# Patient Record
Sex: Female | Born: 1990
Health system: Southern US, Community
[De-identification: ages and names within clinical notes are randomized; demographics above are authoritative.]

## PROBLEM LIST (undated history)

## (undated) DIAGNOSIS — R51 Headache: Secondary | ICD-10-CM

## (undated) DIAGNOSIS — J039 Acute tonsillitis, unspecified: Secondary | ICD-10-CM

## (undated) DIAGNOSIS — T783XXA Angioneurotic edema, initial encounter: Secondary | ICD-10-CM

## (undated) DIAGNOSIS — G8929 Other chronic pain: Secondary | ICD-10-CM

## (undated) DIAGNOSIS — L509 Urticaria, unspecified: Secondary | ICD-10-CM

## (undated) DIAGNOSIS — R519 Headache, unspecified: Secondary | ICD-10-CM

## (undated) DIAGNOSIS — K589 Irritable bowel syndrome without diarrhea: Secondary | ICD-10-CM

## (undated) DIAGNOSIS — J45909 Unspecified asthma, uncomplicated: Secondary | ICD-10-CM

## (undated) DIAGNOSIS — L309 Dermatitis, unspecified: Secondary | ICD-10-CM

## (undated) HISTORY — DX: Angioneurotic edema, initial encounter: T78.3XXA

## (undated) HISTORY — DX: Urticaria, unspecified: L50.9

## (undated) HISTORY — DX: Unspecified asthma, uncomplicated: J45.909

## (undated) HISTORY — DX: Other chronic pain: G89.29

## (undated) HISTORY — DX: Irritable bowel syndrome, unspecified: K58.9

## (undated) HISTORY — DX: Headache: R51

## (undated) HISTORY — PX: WISDOM TOOTH EXTRACTION: SHX21

## (undated) HISTORY — DX: Headache, unspecified: R51.9

## (undated) HISTORY — DX: Dermatitis, unspecified: L30.9

---
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
## 2010-04-14 ENCOUNTER — Ambulatory Visit: Payer: Self-pay | Admitting: Licensed Clinical Social Worker

## 2010-04-15 ENCOUNTER — Ambulatory Visit: Payer: Self-pay | Admitting: Pulmonary Disease

## 2010-04-15 DIAGNOSIS — Z87891 Personal history of nicotine dependence: Secondary | ICD-10-CM | POA: Insufficient documentation

## 2010-04-15 DIAGNOSIS — F341 Dysthymic disorder: Secondary | ICD-10-CM | POA: Insufficient documentation

## 2010-04-16 ENCOUNTER — Ambulatory Visit: Payer: Self-pay | Admitting: Pulmonary Disease

## 2010-04-18 LAB — CONVERTED CEMR LAB: Streptococcus, Group A Screen (Direct): NEGATIVE

## 2010-06-09 ENCOUNTER — Emergency Department (HOSPITAL_COMMUNITY)
Admission: EM | Admit: 2010-06-09 | Discharge: 2010-06-09 | Payer: Self-pay | Source: Home / Self Care | Admitting: Family Medicine

## 2010-08-19 ENCOUNTER — Emergency Department (HOSPITAL_COMMUNITY): Admission: EM | Admit: 2010-08-19 | Discharge: 2010-08-19 | Payer: Self-pay | Admitting: Internal Medicine

## 2010-08-26 ENCOUNTER — Telehealth: Payer: Self-pay | Admitting: Adult Health

## 2010-08-27 ENCOUNTER — Telehealth (INDEPENDENT_AMBULATORY_CARE_PROVIDER_SITE_OTHER): Payer: Self-pay | Admitting: *Deleted
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
## 2010-12-14 NOTE — Assessment & Plan Note (Signed)
Summary: discuss meds/la   CC:  under lots of stress and having alot of anxiety and dry heaves and unable to eat.  History of Present Illness: 20 y/o WF, Labine Arelia Sneddon) daughter of Baird Lyons, here for a follow up visit...   ~  April 15, 2010:  she has been under alot of stress> married last yr, husb in [employer] (prev [place], now Pensions consultant), hx anxiety/ depression/ ?ADD/ ?bipolar & prev seen by Psychiatry DrSmith on Risperdal in the past... she weaned off this on her own & was doing OK til husb deployed & she has moved back to [employer]... saw Judithe Modest in our counselling center> here now for med Rx to help w/ anxiety... notes some palpit & SOB- ?panic; +smoker, some cough... no CP, sputum, hemoptysis, f/c/s, etc... not employed, going to start [employer] soon...   Current Problems:   ALLERGIC RHINITIS (ICD-477.9) - uses OTC antihist Prn...  CIGARETTE SMOKER (ICD-305.1) - not interested in smoking cessation help at the present time...  ANXIETY DEPRESSION (ICD-300.4) - as above> we discussed trial of Prn ALPRAZOLAM 0.5mg - 1/2 to 1 tab  Tid as needed...   Allergies: 1)  ! Penicillin  Comments:  Nurse/Medical Assistant: The patient's medications and allergies were reviewed with the patient and were updated in the Medication and Allergy Lists.  Past History:  Past Medical History:  ALLERGIC RHINITIS (ICD-477.9) CIGARETTE SMOKER (ICD-305.1) ANXIETY DEPRESSION (ICD-300.4)  Family History: Reviewed history from 07/02/2009 and no changes required. Mother-allergies hyperlipidemia - MGM, MGF cancer - MGGM (blood) DM - PGF, controlled with diet  Social History: Reviewed history from 07/02/2009 and no changes required. 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  The patient denies anorexia,  fever, weight loss, weight gain, vision loss, decreased hearing, hoarseness, chest pain, syncope, peripheral edema, prolonged cough, headaches, hemoptysis, abdominal pain, melena, hematochezia, severe indigestion/heartburn, hematuria, incontinence, muscle weakness, suspicious skin lesions, transient blindness, difficulty walking, depression, unusual weight change, abnormal bleeding, enlarged lymph nodes, and angioedema.    Vital Signs:  Patient profile:   20 year old female Height:      65 inches Weight:      113.31 pounds BMI:     18.92 O2 Sat:      98 % on Room air Temp:     97.9 degrees F oral Pulse rate:   108 / minute BP sitting:   110 / 74  (right arm) Cuff size:   regular  Vitals Entered By: Randell Loop CMA (April 15, 2010 11:46 AM)  O2 Sat at Rest %:  98 O2 Flow:  Room air CC: under lots of stress and having alot of anxiety and dry heaves and unable to eat Is Patient Diabetic? No Pain Assessment Patient in pain? no      Comments meds updated today   Physical Exam  Additional Exam:  WD, Thin, 20 y/o WF in NAD... she is 63" tall & weighs 114# for a BMI= 19 GENERAL:  Alert & oriented; pleasant & cooperative... HEENT:  [place]/AT, EOM-wnl, PERRLA, [place]-benign, EACs-clear, TMs-wnl, NOSE-clear, THROAT-clear & wnl. NECK:  Supple w/ full ROM; no JVD; normal carotid impulses w/o bruits; no thyromegaly or nodules palpated; no lymphadenopathy. CHEST:  Clear to P & A; without wheezes/ rales/ or rhonchi. HEART:  Regular Rhythm; without murmurs/ rubs/  or gallops. ABDOMEN:  Soft & nontender; normal bowel sounds; no organomegaly or masses detected. EXT: without deformities or arthritic changes; no varicose veins/ venous insuffic/ or edema. NEURO:  CN's intact; motor testing normal; sensory testing normal; gait normal & balance OK. DERM:  No lesions noted; no rash etc...    Impression & Recommendations:  Problem # 1:  ANXIETY (ICD-300.00) We discussed small supply of Alprazolam 0.5mg   to try for the anxiety... instructed to use it sparingly, as needed... she will f/u w/ Judithe Modest and use her techniques to handle the stress better etc... Her updated medication list for this problem includes:    Alprazolam 0.5 Mg Tabs (Alprazolam) .Marland Kitchen... Take 1/2 to 1 tab by mouth up to 3 times daily as needed for anxiety...  Problem # 2:  CIGARETTE SMOKER (ICD-305.1) She is not interested in smoking cessation help at this time...  Complete Medication List: 1)  Yaz 3-0.02 Mg Tabs (Drospirenone-ethinyl estradiol) .... Take 1 tablet by mouth once a day 2)  Ibuprofen 200 Mg Tabs (Ibuprofen) .... As needed for pain 3)  Alprazolam 0.5 Mg Tabs (Alprazolam) .... Take 1/2 to 1 tab by mouth up to 3 times daily as needed for anxiety...  Patient Instructions: 1)  Today we wrote a new perscription for ALPRAZOLAM to try 1/2 to 1 tab up to 3 times daily as needed for anxiety.Marland KitchenMarland Kitchen  2)  Use it sparingly for the worst of the stress... 3)  Continue the counselling w/ Judithe Modest & her team... 4)  Let us know how it worked for you & if you need a refill later on... Prescriptions: ALPRAZOLAM 0.5 MG TABS (ALPRAZOLAM) take 1/2 to 1 tab by mouth up to 3 times daily as needed for anxiety...  #50 x 0   Entered and Authorized by:   Michele Mcalpine MD   Signed by:   Michele Mcalpine MD on 04/15/2010   Method used:   Print then Give to Patient   RxID:   [id number]

## 2010-12-14 NOTE — Assessment & Plan Note (Signed)
Summary: Acute NP office visit - ? strep   CC:  sore throat, body aches, and fever up to 101.4 onset yesterday.  History of Present Illness: 20 year old female pt of Dr. Kriste Basque with history of depression.   July 02, 2009--Presents for work in visit. Pt recently married , husband is in [employer], now in [place]. She needs papaerwork, physical form filled out./ Planning on leaving when paperwork complete to join him. In good health. Graduated from high school, plans on going to college. Denies chest pain, dyspnea, orthopnea, hemoptysis, fever, n/v/d, edema, headache, rashes. Seen previously by psych during stressful time in high school, was felt to have possible depression ?bipolar disorder. She took risperdal for short time. Stopped on own. Denies depression symptoms, highs/low, suicidal ideations. Says she was stressed in school, social life. That is improved now, very happy. Mom is with her today and says she has been doing very well. They are planning on her seeing pschy in future -they do not agree with diagnosis. There is no hx of bipolar in family. Pt and mother denie any known hx of manic episodes in past, mainly on moody and low episodes-"teenage moments" per mom.    ~  April 15, 2010:  she has been under alot of stress> married last yr, husb in [employer] (prev [place], now Pensions consultant), hx anxiety/ depression/ ?ADD/ ?bipolar & prev seen by Psychiatry DrSmith on Risperdal in the past... she weaned off this on her own & was doing OK til husb deployed & she has moved back to [employer]... saw Judithe Modest in our counselling center> here now for med Rx to help w/ anxiety... notes some palpit & SOB- ?panic; +smoker, some cough... no CP, sputum, hemoptysis, f/c/s, etc... not employed, going to start [employer] school soon...  April 16, 2010--Presents for an acute office visit. Complains of sore throat, body aches, fever up to 101.4 onset yesterday. Painful to swallow. concerned she has strep. Feels bad w/ body aches ,  chills and malaise. Throat is very sore. red and white patches on tonsils.  Denies chest pain, dyspnea, orthopnea, hemoptysis, fever, n/v/d, edema, headache.      Preventive Screening-Counseling & Management  Alcohol-Tobacco     Smoking Status: current  Medications Prior to Update: 1)  Yaz 3-0.02 Mg Tabs (Drospirenone-Ethinyl Estradiol) .... Take 1 Tablet By Mouth Once A Day 2)  Ibuprofen 200 Mg Tabs (Ibuprofen) .... As Needed For Pain 3)  Alprazolam 0.5 Mg Tabs (Alprazolam) .... Take 1/2 To 1 Tab By Mouth Up To 3 Times Daily As Needed For Anxiety...  Current Medications (verified): 1)  Yaz 3-0.02 Mg Tabs (Drospirenone-Ethinyl Estradiol) .... Take 1 Tablet By Mouth Once A Day 2)  Ibuprofen 200 Mg Tabs (Ibuprofen) .... As Needed For Pain 3)  Alprazolam 0.5 Mg Tabs (Alprazolam) .... Take 1/2 To 1 Tab By Mouth Up To 3 Times Daily As Needed For Anxiety...  Allergies (verified): 1)  ! 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  CIGARETTE SMOKER (ICD-305.1) - not interested in smoking cessation help at the present time...  ANXIETY DEPRESSION (ICD-300.4) -  on  Prn ALPRAZOLAM 0.5mg - 1/2 to 1 tab  Tid as needed...    Vital Signs:  Patient profile:   20 year old female Height:      65 inches Weight:      113.31 pounds BMI:     18.92 O2 Sat:      98 % on Room air Temp:     98.5 degrees F oral Pulse rate:   102 / minute BP sitting:   108 / 70  (right arm) Cuff size:   regular  Vitals Entered By: Boone Master CNA/MA  (April 16, 2010 2:55 PM)  O2 Flow:  Room air CC: sore throat, body aches, fever up to 101.4 onset yesterday Is Patient Diabetic? No Comments Medications reviewed with patient Daytime contact number verified with patient. Boone Master CNA/MA  April 16, 2010 2:55 PM    Physical Exam  Additional Exam:  GEN: A/Ox3; pleasant , NAD HEENT:  [place]/AT, ,post phaynx red, tonsillar edema 1+  NECK:  Supple w/ fair ROM; no JVD; normal carotid impulses w/o bruits; no thyromegaly or nodules palpated; ant. cervical andenopathy neg nuchal rigidity.  RESP  Clear to P & A; w/o, wheezes/ rales/ or rhonchi. CARD:  RRR, no m/r/g   GI:   Soft & nt; nml bowel sounds; no organomegaly or masses detected. Musco: Warm bil,  no calf tenderness edema, clubbing, pulses intact  Skin; clear no rash.  Neuro: intact no focal deficits noted.      Impression & Recommendations:  Problem # 1:  PHARYNGITIS, ACUTE (ICD-462)  neg strep . suspect tonsillitis REC:  Zpack take as directed.  salt water gargles as needed  alternate advil and tylenol as needed  increase fluids.  Please contact office for sooner follow up if symptoms do not improve or worsen   Her updated medication list for this problem includes:    Ibuprofen 200 Mg Tabs (Ibuprofen) .Marland Kitchen... As needed for pain    Zithromax Z-pak 250 Mg Tabs (Azithromycin) .Marland Kitchen... Take as directed.  Orders: Est. Patient Level III ([address])  Medications Added to Medication List This Visit: 1)  Zithromax Z-pak 250 Mg Tabs (Azithromycin) .... Take as directed.  Complete Medication List: 1)  Yaz 3-0.02 Mg Tabs (Drospirenone-ethinyl estradiol) .... Take 1 tablet by mouth once a day 2)  Ibuprofen 200 Mg Tabs (Ibuprofen) .... As needed for pain 3)  Alprazolam 0.5 Mg Tabs (Alprazolam) .... Take 1/2 to 1 tab by mouth up to 3 times daily as needed for anxiety.Marland KitchenMarland Kitchen 4)  Zithromax Z-pak 250 Mg Tabs (Azithromycin) .... Take as directed.  Patient Instructions: 1)  Zpack take as directed.    2)  salt water gargles as needed  3)  alternate advil and tylenol as needed  4)  increase fluids.  5)  Please contact office for sooner follow up if symptoms do not improve or worsen  Prescriptions: ZITHROMAX Z-PAK 250 MG TABS (AZITHROMYCIN) take as directed.  #1 x 0   Entered and Authorized by:   Rubye Oaks NP   Signed by:   Tamela Elsayed NP on 04/16/2010   Method used:   [facility]        [facility]  [place] [address] [facility]       [address]       [place], [place]  [address]       Ph: [phone] or [phone]       Fax: [phone]   RxID:   [id number]

## 2011-02-10 ENCOUNTER — Inpatient Hospital Stay (INDEPENDENT_AMBULATORY_CARE_PROVIDER_SITE_OTHER)
Admission: RE | Admit: 2011-02-10 | Discharge: 2011-02-10 | Disposition: A | Discharge status: Home / Self Care | Source: Ambulatory Visit | Attending: Family Medicine | Admitting: Family Medicine

## 2011-02-10 DIAGNOSIS — J309 Allergic rhinitis, unspecified: Secondary | ICD-10-CM

## 2011-04-01 NOTE — Assessment & Plan Note (Signed)
[facility]                               PULMONARY OFFICE NOTE   Suzanne Jarvis, Suzanne Jarvis                     MRN:          [id number]  DATE:09/21/2006                            DOB:          February 01, 1991    This is a 20 year old white female patient of Dr. Kriste Basque, who presents with a  three-day history of suprapubic pressure, dysuria, and urgency.  The patient  has used some over-the-counter Azo without much relief.  The patient does  complain that she has some intermittent discharge.  However, this is  somewhat chronic in nature.  The patient denies any back pain, nausea,  vomiting, fever, abdominal pain.  The patient's periods are irregular.  She  is being seen by Dr. Senaida Ores in gynecology and has had a recent workup  and negative pregnancy test three weeks ago.  The patient reports her last  urinary tract infection was greater than three years ago.  The patient is  currently sexually active.  The patient denies any rash and reports she does  use condoms, as well as birth control.  The patient also complains that she  has had some nasal congestion, left-ear pressure and some mild headaches.   PAST MEDICAL HISTORY:  Reviewed.   CURRENT MEDICATIONS:  Reviewed.   PHYSICAL EXAMINATION:  GENERAL:  The patient is a very pleasant, well-  developed, well-nourished female, in no acute distress.  VITAL SIGNS:  She is afebrile with stable vital signs.  HEENT:  Nasal mucosa is somewhat pale.  Nontender sinuses to percussion.  TMs are normal.  NECK:  The neck is supple, without adenopathy.  Posterior pharynx is clear.  LUNGS:  Lung sounds are clear.  CARDIAC:  Regular rate.  ABDOMEN:  Soft.  EXTREMITIES:  Warm without any edema.   DATA:  Urinalysis reveals small blood, small leukocytes, 20-30 WBC, 3-5 RBC  and 2+ bacteria.   IMPRESSION AND PLAN:  1. Acute cystitis.  The patient is to begin Cipro 250 mg b.i.d. times 7      days.  I advised on urinary  hygiene measures.  The patient is advised      on safe sexual practices and abstinence.  The patient has recently had      extensive workup, being checked for pregnancy and STDs.  However, I      explained to the patient and her mother than she could still have been      exposed from previous times to now and, if symptoms do not totally      resolve, will need a followup with her gynecologist.  I also have      recommended pregnancy test today; however, the patient refused.  Mother      does not want to do that.  I have suggested she get one over-the-      counter and check.  2. Rhinitis.  The patient is to use saline nasal spray and Claritin over-      the-counter, if needed,      along with Mucinex DM twice daily times one week.  The patient  is to      return if no improvement in symptoms.      Rubye Oaks, NP  Electronically Signed      Lonzo Cloud. Kriste Basque, MD  Electronically Signed   TP/MedQ  DD: 09/22/2006  DT: 09/22/2006  Job #: [id number]

## 2011-04-01 NOTE — Assessment & Plan Note (Signed)
[facility]                               PULMONARY OFFICE NOTE   CHANAH, TIDMORE                     MRN:          [id number]  DATE:09/04/2006                            DOB:          12-29-1990    HISTORY:  This patient is a 20 year old white female, new patient to the  practice.  The patient is the daughter of our front Scientist, product/process development, Johny Drilling.  Patient is a Licensed conveyancer at [employer].  She was previously a  patient of Dr. Fabian Sharp, and is changing over to establish care under Dr.  Alroy Dust.  She presents today for an acute office visit.  Complains of a  3-day history of productive cough with clear sputum, nasal congestion and  sore throat.  She denies any chest pain, shortness of breath, recent travel  or antibiotic use.   PAST MEDICAL HISTORY:  Attention deficit disorder, previously on Ritalin,  now off, and previously some mild depression symptoms.  Previously on  Zoloft, however, currently off.  She receives her gynecological care through  Dr. Huel Cote, and their nurse practitioner Harvette Corder.  Immunizations are up to date.   CURRENT MEDICATIONS:  None.   SOCIAL HISTORY:  Patient is a Licensed conveyancer at [employer].  She  denies any smoking, alcohol or drug use.   FAMILY HISTORY:  Positive for diabetes mellitus, coronary artery disease and  hypertension.   REVIEW OF SYMPTOMS:  Essentially negative, except as noted above.   PHYSICAL EXAMINATION:  GENERAL:  Patient is a well-developed, well-  nourished, 20 year old female, in no acute distress.  VITAL SIGNS:  She is afebrile with stable vital signs.  HEENT:  Nasal mucosa with some mild erythema.  Posterior pharynx is clear.  Left TM and EAC are normal.  Right TM is erythematous and bulging.  NECK:  Supple without adenopathy.  LUNGS:  Clear.  CARDIAC:  Regular rate.  ABDOMEN:  Soft and benign.  EXTREMITIES:  Warm without any edema.   IMPRESSION AND  PLAN:  1. Acute upper respiratory infection.  2. Right otitis media.  3. Patient will be on Omnicef x7 days.  4. Mucinex DM twice daily.  5. Allegra D p.r.n.  6. Patient is to return as needed.      ______________________________  Rubye Oaks, NP    ______________________________  Lonzo Cloud. Kriste Basque, MD     TP/MedQ  DD:  09/05/2006  DT:  09/06/2006  Job #:  [id number]

## 2011-09-21 ENCOUNTER — Ambulatory Visit (INDEPENDENT_AMBULATORY_CARE_PROVIDER_SITE_OTHER): Admitting: Adult Health

## 2011-09-21 DIAGNOSIS — B349 Viral infection, unspecified: Secondary | ICD-10-CM

## 2011-09-21 DIAGNOSIS — B9789 Other viral agents as the cause of diseases classified elsewhere: Secondary | ICD-10-CM

## 2011-09-21 MED ORDER — HYDROCODONE-ACETAMINOPHEN 5-500 MG PO TABS: 1.0000 | ORAL_TABLET | Freq: Four times a day (QID) | ORAL | Status: DC | PRN

## 2011-09-21 MED ORDER — ONDANSETRON HCL 4 MG PO TABS: 4.0000 mg | ORAL_TABLET | Freq: Every day | ORAL | Status: DC | PRN

## 2011-09-21 NOTE — Patient Instructions (Signed)
Zofran 4mg  every 8 hr As needed  Nausea/vomitting  Vicodin 1-2 every 8 hr As needed  Headache/pain.  Drink fluids -advance diet as tolerated.  Tylenol As needed  Fever or aches.  Please contact office for sooner follow up if symptoms do not improve or worsen or seek emergency care

## 2011-09-21 NOTE — Progress Notes (Signed)
  Subjective:    Patient ID: Suzanne Jarvis, female    DOB: Dec 31, 1990, 20 y.o.   MRN: [id number]  HPI 20 yo WF with known hx of Depression , active smoker.   09/21/11 Acute OV  Pt presents for an acute office visit. Complains that 3 days ago started with headache, body aches, maliaise , upset stomach . Vomitted yesterday, none today. Has taken motrin with some help.  Several people at her work with similar symptolms. Boyfriend with stomach bug and similar symptoms No bloody stools, fever, chest pain , neck stiffness, visual/speech changes.  No recent travel or abx use.   Review of Systems Constitutional:   No  weight loss, night sweats,  Fevers, chills,  ++fatigue, or  lassitude.  HEENT:   Difficulty swallowing,  Tooth/dental problems, or  Sore throat,                No sneezing, itching, ear ache, ++ nasal congestion, post nasal drip,   CV:  No chest pain,  Orthopnea, PND, swelling in lower extremities, anasarca, dizziness, palpitations, syncope.   GI  No heartburn, indigestion, abdominal pain,   diarrhea, change in bowel habits, loss of appetite, bloody stools.   Resp: No shortness of breath with exertion or at rest.  No excess mucus, no productive cough,  No non-productive cough,  No coughing up of blood.  No change in color of mucus.  No wheezing.  No chest wall deformity  Skin: no rash or lesions.  GU: no dysuria, change in color of urine, no urgency or frequency.  No flank pain, no hematuria   MS:  No joint pain or swelling.  No decreased range of motion.  No back pain.  Psych:  No change in mood or affect. No depression or anxiety.  No memory loss.         Objective:   Physical Exam GEN: A/Ox3; pleasant , NAD, well nourished   HEENT:  [place]/AT,  EACs-clear, TMs-wnl, NOSE-clear, THROAT-clear, no lesions, no postnasal drip or exudate noted.   NECK:  Supple w/ fair ROM; no JVD; normal carotid impulses w/o bruits; no thyromegaly or nodules palpated; no lymphadenopathy.,  neg nuchal rigidity  RESP  Clear  P & A; w/o, wheezes/ rales/ or rhonchi.no accessory muscle use, no dullness to percussion  CARD:  RRR, no m/r/g  , no peripheral edema, pulses intact, no cyanosis or clubbing.  GI:   Soft & nt; nml bowel sounds; no organomegaly or masses detected.  Musco: Warm bil, no deformities or joint swelling noted.   Neuro: alert, no focal deficits noted.    Skin: Warm, no lesions or rashes         Assessment & Plan:

## 2011-09-23 NOTE — Assessment & Plan Note (Signed)
Appears to be viral illness, exam unrevealing  Will treat with supportive/symptoms care.   Plan ; Zofran 4mg  every 8 hr As needed  Nausea/vomitting  Vicodin 1-2 every 8 hr As needed  Headache/pain.  Drink fluids -advance diet as tolerated.  Tylenol As needed  Fever or aches.  Please contact office for sooner follow up if symptoms do not improve or worsen or seek emergency care

## 2011-10-17 ENCOUNTER — Ambulatory Visit (INDEPENDENT_AMBULATORY_CARE_PROVIDER_SITE_OTHER)

## 2011-10-17 DIAGNOSIS — Z23 Encounter for immunization: Secondary | ICD-10-CM

## 2011-11-18 DIAGNOSIS — Z23 Encounter for immunization: Secondary | ICD-10-CM

## 2011-11-28 ENCOUNTER — Other Ambulatory Visit: Payer: Self-pay | Admitting: Adult Health

## 2011-11-28 ENCOUNTER — Telehealth: Payer: Self-pay | Admitting: *Deleted

## 2011-11-28 DIAGNOSIS — J029 Acute pharyngitis, unspecified: Secondary | ICD-10-CM

## 2011-11-28 MED ORDER — OSELTAMIVIR PHOSPHATE 75 MG PO CAPS: 75.0000 mg | ORAL_CAPSULE | Freq: Two times a day (BID) | ORAL | Status: AC

## 2011-11-28 NOTE — Telephone Encounter (Signed)
Per TP: tamiflu 75mg  #10, 1 po bid.  Alternate tylenol and advil for fever/body aches, push fluids, plenty of rest, highly contagious so lots of handwashing and avoid crowds.  Called spoke with patient, advised of TP's recs as stated above.  Pt verbalized her understanding.  [facility] [address] [facility] [place] [facility]'s [facility].

## 2011-11-28 NOTE — Telephone Encounter (Signed)
Received phone call from patient who c/o temp up to 100.4, body aches/weakness, nausea, HA and increased thirst with excessive water/gatorade drinking x3days - nausea began this morning.  Denies v/d.  Pt requested appt - advised with fever/BA/nausea it is best to stay home with excessive hand-washing, rest, fluids.  Will forward to TP for recs.

## 2011-11-29 ENCOUNTER — Other Ambulatory Visit (INDEPENDENT_AMBULATORY_CARE_PROVIDER_SITE_OTHER)

## 2011-11-29 DIAGNOSIS — J029 Acute pharyngitis, unspecified: Secondary | ICD-10-CM

## 2011-11-30 ENCOUNTER — Ambulatory Visit (INDEPENDENT_AMBULATORY_CARE_PROVIDER_SITE_OTHER): Admitting: Adult Health

## 2011-11-30 ENCOUNTER — Encounter: Payer: Self-pay | Admitting: Adult Health

## 2011-11-30 DIAGNOSIS — B9789 Other viral agents as the cause of diseases classified elsewhere: Secondary | ICD-10-CM

## 2011-11-30 DIAGNOSIS — B349 Viral infection, unspecified: Secondary | ICD-10-CM

## 2011-11-30 DIAGNOSIS — R51 Headache: Secondary | ICD-10-CM

## 2011-11-30 MED ORDER — HYDROCODONE-ACETAMINOPHEN 5-500 MG PO TABS: 1.0000 | ORAL_TABLET | ORAL | Status: DC | PRN

## 2011-11-30 NOTE — Patient Instructions (Signed)
Tyelnol altenating with Advil As needed   Push fluids  Vicodin 1-2 every 4-6 hr As needed  For severe headache.  Please contact office for sooner follow up if symptoms do not improve or worsen or seek emergency care  If headache are increasing will need to evaluate further.

## 2011-11-30 NOTE — Assessment & Plan Note (Signed)
Lingering headache s/p viral illness  ? Migraine unresponsive to otc meds  Will treat briefly with vicodin, if continues to occur would avoid narcotics and w/up chronic headaches.   Plan Rest, quiet dark room  vicodin # 6 only given -take 1-2 for headache.

## 2011-11-30 NOTE — Progress Notes (Signed)
  Subjective:    Patient ID: Suzanne Jarvis, female    DOB: [birth year], 20 y.o.   MRN: [id number]  HPI  21 yo WF with known hx of Depression , active smoker.   11/30/2011  Acute OV  Pt presents for an acute office visit. Complains that 3 days ago started with headache, body aches, maliaise , cough, fever -f 101 x 2 days ago. Has not had one since.   Patient states she had a strep test on 11/29/11 and it was negative.  Sister has same symptoms .  She was called in Tamiflu . But did not take.  Strep test was neg.  Feeling some better but now has migraine headache that will not go away with advil or tylenol  Or otc migraine meds.  Mild nausea , no vomitting.  No visual or speech changes. No chest pain or dyspnea.  +photosensitivity.    Review of Systems  Constitutional:   No  weight loss, night sweats,   +Fevers, chills,  ++fatigue, or  Lassitude. +headache   HEENT:   Difficulty swallowing,  Tooth/dental problems, or  Sore throat,                No sneezing, itching, ear ache, ++ nasal congestion, post nasal drip,   CV:  No chest pain,  Orthopnea, PND, swelling in lower extremities, anasarca, dizziness, palpitations, syncope.   GI  No heartburn, indigestion, abdominal pain,   diarrhea, change in bowel habits, loss of appetite, bloody stools.   Resp: No shortness of breath with exertion or at rest.  No excess mucus, no productive cough,  No non-productive cough,  No coughing up of blood.  No change in color of mucus.  No wheezing.  No chest wall deformity  Skin: no rash or lesions.  GU: no dysuria, change in color of urine, no urgency or frequency.  No flank pain, no hematuria   MS:  No joint pain or swelling.  No decreased range of motion.  No back pain.  Psych:  No change in mood or affect. No depression or anxiety.  No memory loss.         Objective:   Physical Exam  GEN: A/Ox3; pleasant , NAD, well nourished   HEENT:  [place]/AT,  EACs-clear, TMs-wnl, NOSE-clear,  THROAT-clear, no lesions, no postnasal drip or exudate noted.   NECK:  Supple w/ fair ROM; no JVD; normal carotid impulses w/o bruits; no thyromegaly or nodules palpated; no lymphadenopathy., neg nuchal rigidity  RESP  Clear  P & A; w/o, wheezes/ rales/ or rhonchi.no accessory muscle use, no dullness to percussion  CARD:  RRR, no m/r/g  , no peripheral edema, pulses intact, no cyanosis or clubbing.  GI:   Soft & nt; nml bowel sounds; no organomegaly or masses detected.  Musco: Warm bil, no deformities or joint swelling noted.   Neuro: alert, no focal deficits noted.  CN 2-12 intact, nml gait, neg neck stiffness   Skin: Warm, no lesions or rashes         Assessment & Plan:

## 2011-11-30 NOTE — Assessment & Plan Note (Signed)
Resolving encouarged fluids  Tylenol and advil As needed

## 2012-01-19 ENCOUNTER — Telehealth: Payer: Self-pay

## 2012-01-19 MED ORDER — AZITHROMYCIN 250 MG PO TABS: ORAL_TABLET | ORAL | Status: AC

## 2012-01-19 NOTE — Telephone Encounter (Signed)
Per SN---zpak #1  Take as directed, mucinex 2 po bid with plenty of fluids, nasal saline spray, tylenol cold and sinus.  chantel will call and speak with the pt about SN recs.

## 2012-01-19 NOTE — Telephone Encounter (Signed)
Called spoke with patient who c/o runny nose with clear drainage, PND, prod cough with large amounts of clear mucus, sore throat, sneezing, burning in nasal passage onset today.  Denies f/c/s, wheezing, SOB, HA.  Requesting rx.  [facility] [place]. Allergies  Allergen Reactions  . Penicillins     REACTION: hives   Last ov w/ TP: 1.16.13, 11.7.12.  Last ov with SN: 6.2.11.  Dr Kriste Basque please advise, thanks.

## 2012-01-23 ENCOUNTER — Encounter: Payer: Self-pay | Admitting: Adult Health

## 2012-01-23 ENCOUNTER — Ambulatory Visit (INDEPENDENT_AMBULATORY_CARE_PROVIDER_SITE_OTHER): Admitting: Adult Health

## 2012-01-23 VITALS — BP 120/80 | HR 89 | Temp 98.6°F | Ht 65.0 in | Wt 130.0 lb

## 2012-01-23 DIAGNOSIS — J209 Acute bronchitis, unspecified: Secondary | ICD-10-CM

## 2012-01-23 MED ORDER — HYDROCODONE-HOMATROPINE 5-1.5 MG/5ML PO SYRP: 5.0000 mL | ORAL_SOLUTION | Freq: Four times a day (QID) | ORAL | Status: AC | PRN

## 2012-01-23 MED ORDER — CLARITHROMYCIN ER 500 MG PO TB24: 1000.0000 mg | ORAL_TABLET | Freq: Every day | ORAL | Status: AC

## 2012-01-23 NOTE — Assessment & Plan Note (Signed)
Slow to resolve exercerbation   Plan:  Biaxin XL Pack take with food  Mucinex DM Twice daily  As needed  Cough/congestion  Fluids and rest  Saline nasal rinses As needed   Zyrtec 10 mg At bedtime  As needed  Drainage.  Hydromet 1-2 tsp every 4-6 hr As needed  Cough-may make you sleepy.  Please contact office for sooner follow up if symptoms do not improve or worsen or seek emergency care

## 2012-01-23 NOTE — Progress Notes (Signed)
  Subjective:    Patient ID: Suzanne Jarvis, female    DOB: Jul 13, 1991, 20 y.o.   MRN: [id number]  HPI  21 yo WF with known hx of Depression , active smoker.   11/30/2011  Acute OV  Pt presents for an acute office visit. Complains that 3 days ago started with headache, body aches, maliaise , cough, fever -f 101 x 2 days ago. Has not had one since.   Patient states she had a strep test on 11/29/11 and it was negative.  Sister has same symptoms .  She was called in Tamiflu . But did not take.  Strep test was neg.  Feeling some better but now has migraine headache that will not go away with advil or tylenol  Or otc migraine meds.  Mild nausea , no vomitting.  No visual or speech changes. No chest pain or dyspnea.  +photosensitivity.  >>tamiflu rx   01/23/2012 Acute OV  Complains of prod cough with yellow/green mucus, wheezing, increased SOB, runny nose, light yellow nasal drainage, sweats, popping in ears x5days. Was given zpak 3.7.13, to take last dose tonight.  went to UC 3.9.13 and was given depo injection. Still coughing up and blowing out green mucus.  No fever or chest pain. No hemoptysis.     Review of Systems  Constitutional:   No  weight loss, night sweats,   +Fevers, chills,  ++fatigue, or  Lassitude. +headache   HEENT:   Difficulty swallowing,  Tooth/dental problems, or  Sore throat,                No sneezing, itching, ear ache, ++ nasal congestion, post nasal drip,   CV:  No chest pain,  Orthopnea, PND, swelling in lower extremities, anasarca, dizziness, palpitations, syncope.   GI  No heartburn, indigestion, abdominal pain,   diarrhea, change in bowel habits, loss of appetite, bloody stools.   Resp:    No coughing up of blood.     No chest wall deformity  Skin: no rash or lesions.  GU: no dysuria, change in color of urine, no urgency or frequency.  No flank pain, no hematuria   MS:  No joint pain or swelling.  No decreased range of motion.  No back  pain.  Psych:  No change in mood or affect. No depression or anxiety.  No memory loss.         Objective:   Physical Exam  GEN: A/Ox3; pleasant , NAD, well nourished   HEENT:  [place]/AT,  EACs-clear, TMs-wnl, NOSE-clear drainage , THROAT-clear, no lesions, no postnasal drip or exudate noted.   NECK:  Supple w/ fair ROM; no JVD; normal carotid impulses w/o bruits; no thyromegaly or nodules palpated; no lymphadenopathy.  RESP  Clear  P & A; w/o, wheezes/ rales/ or rhonchi.no accessory muscle use, no dullness to percussion  CARD:  RRR, no m/r/g  , no peripheral edema, pulses intact, no cyanosis or clubbing.  GI:   Soft & nt; nml bowel sounds; no organomegaly or masses detected.  Musco: Warm bil, no deformities or joint swelling noted.   Neuro: alert, no focal deficits noted.   Skin: Warm, no lesions or rashes         Assessment & Plan:

## 2012-01-23 NOTE — Patient Instructions (Addendum)
Biaxin XL Pack take with food  Mucinex DM Twice daily  As needed  Cough/congestion  Fluids and rest  Saline nasal rinses As needed   Zyrtec 10 mg At bedtime  As needed  Drainage.  Hydromet 1-2 tsp every 4-6 hr As needed  Cough-may make you sleepy.  Please contact office for sooner follow up if symptoms do not improve or worsen or seek emergency care

## 2012-01-24 ENCOUNTER — Telehealth: Payer: Self-pay

## 2012-01-24 MED ORDER — ONDANSETRON HCL 4 MG PO TABS: ORAL_TABLET | ORAL | Status: DC

## 2012-01-24 NOTE — Telephone Encounter (Signed)
Pt was seen by TP yesterday and prescribed Hydromet.  Pt states the Hydromet is helping her cough so she doesn't want to stop taking it but it is causing her to be very nauseated and is requesting something to help with the nausea.  TP, please advise.  Thanks! Allergies  Allergen Reactions  . Penicillins     REACTION: hives

## 2012-01-24 NOTE — Telephone Encounter (Signed)
Pt aware rx sent to pharmacy.

## 2012-01-24 NOTE — Telephone Encounter (Signed)
zofran 4mg  every 4-6 hr As needed  #20 for nause  No refills  Please contact office for sooner follow up if symptoms do not improve or worsen or seek emergency care

## 2012-02-16 ENCOUNTER — Telehealth: Payer: Self-pay

## 2012-02-16 NOTE — Telephone Encounter (Signed)
I spoke with pt and she stated she had a stye under her eyelid. Pt is coming in to see TP 02/17/12 at 2:00 to be evaluated. Pt aware to seek emergency care if she worsens. Nothing further was needed

## 2012-02-17 ENCOUNTER — Encounter: Payer: Self-pay | Admitting: Adult Health

## 2012-02-17 ENCOUNTER — Ambulatory Visit (INDEPENDENT_AMBULATORY_CARE_PROVIDER_SITE_OTHER): Admitting: Adult Health

## 2012-02-17 VITALS — BP 116/64 | HR 110 | Temp 97.8°F | Ht 65.0 in | Wt 125.4 lb

## 2012-02-17 DIAGNOSIS — H00019 Hordeolum externum unspecified eye, unspecified eyelid: Secondary | ICD-10-CM

## 2012-02-17 MED ORDER — ERYTHROMYCIN 5 MG/GM OP OINT: TOPICAL_OINTMENT | Freq: Four times a day (QID) | OPHTHALMIC | Status: AC

## 2012-02-17 NOTE — Progress Notes (Signed)
Subjective:    Patient ID: Suzanne Jarvis, female    DOB: 1991/04/22, 21 y.o.   MRN: [id number]  HPI  21 yo WF with known hx of Depression , active smoker.   11/30/2011  Acute OV  Pt presents for an acute office visit. Complains that 3 days ago started with headache, body aches, maliaise , cough, fever -f 101 x 2 days ago. Has not had one since.   Patient states she had a strep test on 11/29/11 and it was negative.  Sister has same symptoms .  She was called in Tamiflu . But did not take.  Strep test was neg.  Feeling some better but now has migraine headache that will not go away with advil or tylenol  Or otc migraine meds.  Mild nausea , no vomitting.  No visual or speech changes. No chest pain or dyspnea.  +photosensitivity.  >>tamiflu rx   01/23/12  Acute OV  Complains of prod cough with yellow/green mucus, wheezing, increased SOB, runny nose, light yellow nasal drainage, sweats, popping in ears x5days. Was given zpak 3.7.13, to take last dose tonight.  went to UC 3.9.13 and was given depo injection. Still coughing up and blowing out green mucus.  No fever or chest pain. No hemoptysis.  >>Biaxin   02/17/2012 Acute OV  Complains of left eye stye for 5 days . Very tender stye along lower eyelid on inner corner . Sore to touch and red. No visual changes. No drainage. Using warm heat.  No fever . No known injury .    Review of Systems  Constitutional:   No  weight loss, night sweats,    HEENT:   Difficulty swallowing,  Tooth/dental problems, or  Sore throat,                No sneezing, itching, ear ache,  CV:  No chest pain,  Orthopnea, PND, swelling in lower extremities, anasarca, dizziness, palpitations, syncope.   GI  No heartburn, indigestion, abdominal pain,   diarrhea, change in bowel habits, loss of appetite, bloody stools.   Resp:    No coughing up of blood.     No chest wall deformity  Skin: no rash or lesions.  GU: no dysuria, change in color of urine, no urgency or  frequency.  No flank pain, no hematuria   MS:  No joint pain or swelling.  No decreased range of motion.  No back pain.  Psych:  No change in mood or affect. No depression or anxiety.  No memory loss.         Objective:   Physical Exam  GEN: A/Ox3; pleasant , NAD, well nourished   HEENT:  [place]/AT,  EACs-clear, TMs-wnl, NOSE-clear drainage , THROAT-clear, no lesions, no postnasal drip or exudate noted.  Along left inner lower lid w/ small red tender hordelum noted. , no drainage. Conjunctiva noninjected.   NECK:  Supple w/ fair ROM; no JVD; normal carotid impulses w/o bruits; no thyromegaly or nodules palpated; no lymphadenopathy.  RESP  Clear  P & A; w/o, wheezes/ rales/ or rhonchi.no accessory muscle use, no dullness to percussion  CARD:  RRR, no m/r/g  , no peripheral edema, pulses intact, no cyanosis or clubbing.  GI:   Soft & nt; nml bowel sounds; no organomegaly or masses detected.  Musco: Warm bil, no deformities or joint swelling noted.   Neuro: alert, no focal deficits noted.   Skin: Warm, no lesions or rashes  Assessment & Plan:

## 2012-02-17 NOTE — Assessment & Plan Note (Signed)
Left eye stye   Plan  Hot compresses several times daily  If not improving will need opthalmology referral emycin ointment x 7 d

## 2012-02-17 NOTE — Patient Instructions (Addendum)
Hot compresses to eye  Emycin eye ointment to lower lid four times daily x 7 days  If not improving call back will need referral to eye doctor.  Please contact office for sooner follow up if symptoms do not improve or worsen or seek emergency care

## 2012-05-02 ENCOUNTER — Encounter (HOSPITAL_COMMUNITY): Payer: Self-pay | Admitting: Emergency Medicine

## 2012-05-02 ENCOUNTER — Emergency Department (HOSPITAL_COMMUNITY)
Admission: EM | Admit: 2012-05-02 | Discharge: 2012-05-02 | Disposition: A | Discharge status: Home / Self Care | Attending: Emergency Medicine | Admitting: Emergency Medicine

## 2012-05-02 DIAGNOSIS — J029 Acute pharyngitis, unspecified: Secondary | ICD-10-CM

## 2012-05-02 DIAGNOSIS — F17200 Nicotine dependence, unspecified, uncomplicated: Secondary | ICD-10-CM | POA: Insufficient documentation

## 2012-05-02 HISTORY — DX: Acute tonsillitis, unspecified: J03.90

## 2012-05-02 MED ORDER — IBUPROFEN 800 MG PO TABS: 800.0000 mg | ORAL_TABLET | Freq: Three times a day (TID) | ORAL | Status: AC | PRN

## 2012-05-02 MED ORDER — DEXAMETHASONE SODIUM PHOSPHATE 10 MG/ML IJ SOLN
10.0000 mg | Freq: Once | INTRAMUSCULAR | Status: AC
  Administered 2012-05-02: 10 mg via INTRAMUSCULAR
  Filled 2012-05-02: qty 1

## 2012-05-02 NOTE — Discharge Instructions (Signed)
Please read and follow all provided instructions.  Your diagnoses today include:  1. Pharyngitis     Tests performed today include:  Strep test - negative  Vital signs. See below for your results today.   Medications prescribed:   Ibuprofen - anti-inflammatory pain medication  Do not exceed 800mg  ibuprofen every 8 hours  You have been prescribed an anti-inflammatory medication or NSAID. Take with food. Take smallest effective dose for the shortest duration needed for your pain. Stop taking if you experience stomach pain or vomiting.   Home care instructions:  Follow any educational materials contained in this packet.  Follow-up instructions: Please follow-up with your primary care provider in the next 3 days for further evaluation of your symptoms. If you do not have a primary care doctor -- see below for referral information.   Return instructions:   Please return to the Emergency Department if you experience worsening symptoms.   Return with persistent fever, worsening neck swelling, or if you have any trouble breathing  Please return if you have any other emergent concerns.  Additional Information:  Your vital signs today were: BP 126/74  Pulse 103  Temp 98.6 F (37 C) (Oral)  Resp 16  Ht 5\' 5"  (1.651 m)  Wt 120 lb (54.432 kg)  BMI 19.97 kg/m2  SpO2 100%  LMP 04/29/2012 If your blood pressure (BP) was elevated above 135/85 this visit, please have this repeated by your doctor within one month. -------------- No Primary Care Doctor Call [facility]  [phone] Other agencies that provide inexpensive medical care    [facility]  [phone]    [facility]  [phone]    [facility]  [phone]    [facility]  [phone]    [facility]  [phone]    [facility]  [phone] -------------- RESOURCE GUIDE:  Dental Problems  Patients with Medicaid: [facility]  Dental [phone] [address]                                            [phone] [address] Phone:  [phone]                                                   Phone:  [phone]  If unable to pay or uninsured, contact:  [facility] or [facility]. to become qualified for the adult dental clinic.  Chronic Pain Problems Contact [facility]  [phone] Patients need to be referred by their primary care doctor.  Insufficient Money for Medicine Contact [employer]:  call "211" or [facility] [phone].  Psychological Services [facility] Behavioral Health  [phone] [facility]  [phone] [facility] Mental Health   [phone] (emergency services [phone])  Substance Abuse Resources [facility]  [phone] [facility] [phone] [facility] [phone] Floydene Flock [phone] Residential & Outpatient Substance Abuse Program  [phone]  Abuse/Neglect [facility] Child Abuse Hotline [phone] [facility] Child Abuse Hotline [phone] (After Hours)  Emergency Shelter [facility] Ministries [phone]  Maternity  Homes Room at the [place] [facility] [place] [phone] [employer] Services [phone]  [facility] Resources  [facility]     [employer]                          [facility] Dept. [address] [place]                       [address]      [address]  Blondell Reveal Phone:  [phone]                                   Phone:  [phone]                 Phone:  [phone]  [facility] Mental Health Phone:  [phone]  [facility] Child Abuse Hotline [phone] [phone] (After Hours)

## 2012-05-02 NOTE — ED Notes (Signed)
Pt c/o sore throat that started on Sunday.  Pt reports difficulty swallowing last night.  Reports subjective fever. Denies SOB and chest pain.

## 2012-05-02 NOTE — ED Provider Notes (Signed)
History     CSN: [id number]  Arrival date & time 05/02/12  1431   None     Chief Complaint  Patient presents with  . Sore Throat    (Consider location/radiation/quality/duration/timing/severity/associated sxs/prior treatment) HPI Comments: Patient reports complaining of a sore throat since last Sunday. She describes the pain as scratchy and says that it is hard for her to swallow due to the pain and swelling. She reports that she has had multiple episodes of tonsillitis within the last few years, and this feels similar to the past episodes. She reports every time she has been seen for similar symptoms she is tested for strep and that the test comes back negative. With her previous episodes of tonsillitis she reports that a "steroid shot" has helped with the swelling. She denies fever, runny nose, ear pain and nausea or vomiting. She reports that her cough she has from smoking is unchanged from normal. She tried tylenol for the pain with no relief.   Patient is a 21 y.o. female presenting with pharyngitis. The history is provided by the patient.  Sore Throat This is a recurrent problem. The current episode started in the past 7 days. The problem occurs constantly. The problem has been gradually worsening. Associated symptoms include a sore throat and swollen glands. Pertinent negatives include no abdominal pain, anorexia, arthralgias, change in bowel habit, chest pain, congestion, coughing, fever (paresthesias), headaches, myalgias, nausea, neck pain, rash, urinary symptoms or vomiting. Associated symptoms comments: Chronic cough from smoking.. The symptoms are aggravated by eating and swallowing. She has tried acetaminophen, rest and sleep for the symptoms. The treatment provided mild relief.    Past Medical History  Diagnosis Date  . Tonsillitis     History reviewed. No pertinent past surgical history.  History reviewed. No pertinent family history.  History  Substance Use Topics  .  Smoking status: Current Everyday Smoker -- 0.5 packs/day for 5 years    Types: Cigarettes  . Smokeless tobacco: Not on file  . Alcohol Use: No    OB History    Grav Para Term Preterm Abortions TAB SAB Ect Mult Living                  Review of Systems  Constitutional: Negative for fever (paresthesias).  HENT: Positive for sore throat. Negative for congestion, rhinorrhea and neck pain.   Eyes: Negative for redness.  Respiratory: Negative for cough and shortness of breath.   Cardiovascular: Negative for chest pain.  Gastrointestinal: Negative for nausea, vomiting, abdominal pain, diarrhea, anorexia and change in bowel habit.  Genitourinary: Negative for dysuria.  Musculoskeletal: Negative for myalgias and arthralgias.  Skin: Negative for rash.  Neurological: Negative for headaches.    Allergies  Penicillins  Home Medications   Current Outpatient Rx  Name Route Sig Dispense Refill  . DROSPIRENONE-ETHINYL ESTRADIOL 3-0.02 MG PO TABS Oral Take 1 tablet by mouth daily.      . IBUPROFEN 800 MG PO TABS Oral Take 800 mg by mouth every 8 (eight) hours as needed. For cramps    . ONDANSETRON HCL 4 MG PO TABS Oral Take 4 mg by mouth every 4 (four) hours as needed. For nausea      BP 126/74  Pulse 103  Temp 98.6 F (37 C) (Oral)  Resp 16  Ht 5\' 5"  (1.651 m)  Wt 120 lb (54.432 kg)  BMI 19.97 kg/m2  SpO2 100%  LMP 04/29/2012  Physical Exam  Nursing note and vitals reviewed. Constitutional:  She appears well-developed and well-nourished.  HENT:  Head: Normocephalic and atraumatic.  Right Ear: Tympanic membrane, external ear and ear canal normal.  Left Ear: Tympanic membrane, external ear and ear canal normal.  Nose: Nose normal. No mucosal edema or rhinorrhea.  Mouth/Throat: Uvula is midline and mucous membranes are normal. Oropharyngeal exudate, posterior oropharyngeal edema and posterior oropharyngeal erythema present. No tonsillar abscesses.  Eyes: Conjunctivae are normal.  Right eye exhibits no discharge. Left eye exhibits no discharge.  Neck: Normal range of motion. Neck supple.  Cardiovascular: Normal rate, regular rhythm and normal heart sounds.   Pulmonary/Chest: Effort normal and breath sounds normal.  Abdominal: Soft. There is no tenderness.  Lymphadenopathy:    She has cervical adenopathy.  Neurological: She is alert.  Skin: Skin is warm and dry.  Psychiatric: She has a normal mood and affect.    ED Course  Procedures (including critical care time)   Labs Reviewed  RAPID STREP SCREEN   No results found.   1. Pharyngitis     4:00 PM Patient seen and examined. Medications ordered. Pt requests steroid shot.   Vital signs reviewed and are as follows: Filed Vitals:   05/02/12 1446  BP: 126/74  Pulse: 103  Temp: 98.6 F (37 C)  Resp: 16   Steroid given. Pt informed of neg strep result. Urged to return with worsening or other concerns.   MDM  CENTOR 3/4 -- strep neg, abx not indicated. Steroids given for tonsillar edema. No airway compromise. Patient appears well, non-toxic. No suspicion for peritonsillar abscess or retropharyngeal abscess.         Renne Crigler, [place] 05/02/12 1720

## 2012-05-02 NOTE — ED Provider Notes (Signed)
Medical screening examination/treatment/procedure(s) were performed by non-physician practitioner and as supervising physician I was immediately available for consultation/collaboration.   Shade Kaley, MD 05/02/12 1956 

## 2012-07-09 ENCOUNTER — Telehealth: Payer: Self-pay | Admitting: Pulmonary Disease

## 2012-07-09 MED ORDER — CIPROFLOXACIN HCL 250 MG PO TABS: 250.0000 mg | ORAL_TABLET | Freq: Two times a day (BID) | ORAL | Status: AC

## 2012-07-09 NOTE — Telephone Encounter (Signed)
Pt aware of RX sent and will schedule appt with SN for follow up.

## 2012-07-09 NOTE — Telephone Encounter (Signed)
Per SN---ok to call in cirpo 250 mg   #14  i po bid until gone.   Pt will need to schedule a follow up with SN.  thanks

## 2012-07-09 NOTE — Telephone Encounter (Signed)
Pt c/o burning with urination and frequency for 3 days.  Pt has taken AZO otc without relief.  Please advise.

## 2012-11-26 ENCOUNTER — Ambulatory Visit (INDEPENDENT_AMBULATORY_CARE_PROVIDER_SITE_OTHER): Payer: 59

## 2012-11-26 DIAGNOSIS — Z23 Encounter for immunization: Secondary | ICD-10-CM

## 2013-02-22 ENCOUNTER — Ambulatory Visit (INDEPENDENT_AMBULATORY_CARE_PROVIDER_SITE_OTHER): Payer: BC Managed Care – PPO | Admitting: Adult Health

## 2013-02-22 ENCOUNTER — Encounter: Payer: Self-pay | Admitting: Adult Health

## 2013-02-22 VITALS — BP 108/68 | HR 88 | Temp 99.3°F | Ht 65.0 in | Wt 126.8 lb

## 2013-02-22 DIAGNOSIS — J309 Allergic rhinitis, unspecified: Secondary | ICD-10-CM

## 2013-02-22 MED ORDER — AZITHROMYCIN 250 MG PO TABS: ORAL_TABLET | ORAL | Status: AC

## 2013-02-22 NOTE — Progress Notes (Signed)
  Subjective:    Patient ID: Suzanne Jarvis, female    DOB: [birth year], 22 y.o.   MRN: [id number]  HPI  22 yo WF with known hx of Depression , active smoker.   02/22/2013 Acute OV  Complains of head congestion, light green nasal drainage, PND w/ sore throat, bilateral ear congestion, sinus pressure x2 days - denies f/c/s, SOB, wheezing, tightness No recent travel or abx use.  Claritin is not helping.   Review of Systems  Constitutional:   No  weight loss, night sweats,    HEENT:   Difficulty swallowing,  Tooth/dental problems, or  Sore throat,  ++sneezing, itching, ear ache   CV:  No chest pain,  Orthopnea, PND, swelling in lower extremities, anasarca, dizziness, palpitations, syncope.   GI  No heartburn, indigestion, abdominal pain,   diarrhea, change in bowel habits, loss of appetite, bloody stools.   Resp:    No coughing up of blood.     No chest wall deformity  Skin: no rash or lesions.  GU: no dysuria, change in color of urine, no urgency or frequency.  No flank pain, no hematuria   MS:  No joint pain or swelling.  No decreased range of motion.  No back pain.  Psych:  No change in mood or affect. No depression or anxiety.  No memory loss.         Objective:   Physical Exam  GEN: A/Ox3; pleasant , NAD  HEENT:  [place]/AT,  EACs-clear, TMs-wnl, NOSE-clear drainage , THROAT-clear, no lesions, no postnasal drip or exudate noted.    NECK:  Supple w/ fair ROM; no JVD; normal carotid impulses w/o bruits; no thyromegaly or nodules palpated; no lymphadenopathy.  RESP  Clear  P & A; w/o, wheezes/ rales/ or rhonchi.no accessory muscle use, no dullness to percussion  CARD:  RRR, no m/r/g  , no peripheral edema, pulses intact, no cyanosis or clubbing.  GI:   Soft & nt; nml bowel sounds; no organomegaly or masses detected.  Musco: Warm bil, no deformities or joint swelling noted.   Neuro: alert, no focal deficits noted.   Skin: Warm, no lesions or rashes          Assessment & Plan:

## 2013-02-22 NOTE — Patient Instructions (Addendum)
Zpack to have on hold if symptoms worsen with discolored mucus .  Allegra D daily As needed  Drainage.  Saline nasal rinses As needed   Begin Dymista 1 puff Twice daily  Until sample is gone.  Fluids and rest  Tylenol As needed   Please contact office for sooner follow up if symptoms do not improve or worsen or seek emergency care

## 2013-02-22 NOTE — Assessment & Plan Note (Signed)
Flare -suspect non-infectious   Plan  Zpack to have on hold if symptoms worsen with discolored mucus .  Allegra D daily As needed  Drainage.  Saline nasal rinses As needed   Begin Dymista 1 puff Twice daily  Until sample is gone.  Fluids and rest  Tylenol As needed   Please contact office for sooner follow up if symptoms do not improve or worsen or seek emergency care

## 2013-04-21 ENCOUNTER — Emergency Department (INDEPENDENT_AMBULATORY_CARE_PROVIDER_SITE_OTHER)
Admission: EM | Admit: 2013-04-21 | Discharge: 2013-04-21 | Disposition: A | Discharge status: Home / Self Care | Payer: BC Managed Care – PPO | Source: Home / Self Care | Attending: Emergency Medicine | Admitting: Emergency Medicine

## 2013-04-21 ENCOUNTER — Encounter (HOSPITAL_COMMUNITY): Payer: Self-pay

## 2013-04-21 DIAGNOSIS — S7010XA Contusion of unspecified thigh, initial encounter: Secondary | ICD-10-CM

## 2013-04-21 DIAGNOSIS — S7011XA Contusion of right thigh, initial encounter: Secondary | ICD-10-CM

## 2013-04-21 DIAGNOSIS — S139XXA Sprain of joints and ligaments of unspecified parts of neck, initial encounter: Secondary | ICD-10-CM

## 2013-04-21 DIAGNOSIS — S161XXA Strain of muscle, fascia and tendon at neck level, initial encounter: Secondary | ICD-10-CM

## 2013-04-21 MED ORDER — MELOXICAM 7.5 MG PO TABS: 7.5000 mg | ORAL_TABLET | Freq: Every day | ORAL | Status: DC

## 2013-04-21 MED ORDER — CYCLOBENZAPRINE HCL 10 MG PO TABS: 10.0000 mg | ORAL_TABLET | Freq: Two times a day (BID) | ORAL | Status: DC | PRN

## 2013-04-21 MED ORDER — TRAMADOL HCL 50 MG PO TABS: 50.0000 mg | ORAL_TABLET | Freq: Four times a day (QID) | ORAL | Status: DC | PRN
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
## 2013-05-15 ENCOUNTER — Encounter: Payer: Self-pay | Admitting: Pulmonary Disease

## 2013-08-07 ENCOUNTER — Ambulatory Visit (INDEPENDENT_AMBULATORY_CARE_PROVIDER_SITE_OTHER): Payer: 59

## 2013-08-07 DIAGNOSIS — Z23 Encounter for immunization: Secondary | ICD-10-CM

## 2013-08-08 DIAGNOSIS — Z23 Encounter for immunization: Secondary | ICD-10-CM

## 2013-09-05 ENCOUNTER — Ambulatory Visit (INDEPENDENT_AMBULATORY_CARE_PROVIDER_SITE_OTHER): Payer: BC Managed Care – PPO | Admitting: Internal Medicine

## 2013-09-05 ENCOUNTER — Encounter: Payer: Self-pay | Admitting: Internal Medicine

## 2013-09-05 VITALS — BP 114/76 | HR 98 | Ht 65.0 in | Wt 132.0 lb

## 2013-09-05 DIAGNOSIS — F17200 Nicotine dependence, unspecified, uncomplicated: Secondary | ICD-10-CM

## 2013-09-05 DIAGNOSIS — J029 Acute pharyngitis, unspecified: Secondary | ICD-10-CM

## 2013-09-05 MED ORDER — AZELASTINE-FLUTICASONE 137-50 MCG/ACT NA SUSP: 2.0000 | Freq: Every day | NASAL | Status: DC

## 2013-09-05 NOTE — Progress Notes (Signed)
09/05/13- 22 yoF smoker Self referral; continues to have sore throats and drainage; gets "open jaw" as well. Has pressure in ears when throat starts to hurt. Mother wowrks for this office.  New-onset in the last month of repeated sore throats, shifting from one side to the other. Worse at night. Slight cough. Throat discomfort extends up into her years. When she opens her jaw she feels tight through the throats "like a cramp". Has tried Pepcid, Allegra-helping temporarily. Globus sensation. Some history spring and fall pollen rhinitis. No ENT surgery. At least mild discomfort in her throat when she swallows without choking. No change in her voice. Lying on her stomach causes throat tickle/cough but she rarely feels heartburn. Had an incidental exposure to bleach which irritates her lungs. Medical history of IBS and frequent headaches. 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 Smokes one quarter pack per day- cessation counseling done.  Prior to Admission medications   Medication Sig Start Date End Date Taking? Authorizing Provider  drospirenone-ethinyl estradiol (YAZ,GIANVI,LORYNA) 3-0.02 MG tablet Take 1 tablet by mouth daily.     Yes Historical Provider, MD  loratadine (CLARITIN) 10 MG tablet Take 10 mg by mouth daily.   Yes Historical Provider, MD  Azelastine-Fluticasone (DYMISTA) 137-50 MCG/ACT SUSP Place 2 sprays into both nostrils at bedtime. 09/05/13   Waymon Budge, MD   Past Medical History  Diagnosis Date  . Tonsillitis   . Asthma   . IBS (irritable bowel syndrome)   . Chronic headaches    History reviewed. No pertinent past surgical history. . Family History  Problem Relation Age of Onset  . Allergies Mother   . Asthma Mother   . Lung cancer Other     paternal great grandfather  . Lung cancer Other     maternal great grandfather  . Cancer Other     Blood-great grandmother   History   Social History  . Marital Status: Single     Spouse Name: N/A    Number of Children: 0  . Years of Education: N/A   Occupational History  . cosmetologist    Social History Main Topics  . Smoking status: Current Every Day Smoker -- 0.25 packs/day for 6 years    Types: Cigarettes  . Smokeless tobacco: Not on file  . Alcohol Use: Yes     Comment: rare  . Drug Use: No  . Sexual Activity: Not on file   Other Topics Concern  . Not on file   Social History Narrative  . No narrative on file   ROS-see HPI Constitutional:   No-   weight loss, night sweats, fevers, chills, fatigue, lassitude. HEENT:   No-  headaches, difficulty swallowing, tooth/dental problems, +sore throat,       +sneezing, itching, +ear ache, nasal congestion, post nasal drip,  CV:  No-   chest pain, orthopnea, PND, swelling in lower extremities, anasarca, dizziness, palpitations Resp: No-   shortness of breath with exertion or at rest.              No-   productive cough,  No non-productive cough,  No- coughing up of blood.              No-   change in color of mucus.  No- wheezing.   Skin: No-   rash or lesions. GI:  No-   heartburn, indigestion, abdominal pain, nausea, vomiting, diarrhea,  change in bowel habits, loss of appetite GU: No-   dysuria, change in color of urine, no urgency or frequency.  No- flank pain. MS:  No-   joint pain or swelling.  No- decreased range of motion.  No- back pain. Neuro-     nothing unusual Psych:  No- change in mood or affect. No depression or anxiety.  No memory loss.  OBJ- Physical Exam General- Alert, Oriented, Affect-appropriate, Distress- none acute Skin- rash-none, lesions- none, excoriation- none Lymphadenopathy- none Head- atraumatic            Eyes- Gross vision intact, PERRLA, conjunctivae and secretions clear            Ears- Hearing, canals-normal            Nose- Clear, no-Septal dev, mucus, polyps, erosion, perforation             Throat- Mallampati II , mucosa clear , drainage- none,  tonsils- atrophic + she held her upper neck                     when she opened her jaw widely Neck- flexible , trachea midline, no stridor , thyroid nl, carotid no bruit Chest - symmetrical excursion , unlabored           Heart/CV- RRR , no murmur , no gallop  , no rub, nl s1 s2                           - JVD- none , edema- none, stasis changes- none, varices- none           Lung- clear to P&A, wheeze- none, cough- none , dullness-none, rub- none           Chest wall-  Abd- tender-no, distended-no, bowel sounds-present, HSM- no Br/ Gen/ Rectal- Not done, not indicated Extrem- cyanosis- none, clubbing, none, atrophy- none, strength- nl Neuro- grossly intact to observation

## 2013-09-05 NOTE — Patient Instructions (Signed)
I strongly suggest you make up your mind now to get off the cigarettes. I don't know if they are a part of your discomforts now, but they can't be helping.  Sample Dymista nasal spray     1-2 puffs each nostril once daily at bedtime  Try otc omeprazole acid blocker once daily before a  Meal  Please let me know if you don't get better

## 2013-09-21 NOTE — Assessment & Plan Note (Signed)
Smoking cessation advised. Smoking cessation program.

## 2013-09-21 NOTE — Assessment & Plan Note (Addendum)
Possibly a lingering post viral pharyngitis with eustachian dysfunction. Aggravating factors may include seasonal allergic rhinitis, her smoking, and GERD Plan-sample of Dymista to stop postnasal drip, regular use of omeprazole for trial, smoking cessation

## 2014-08-12 ENCOUNTER — Ambulatory Visit (INDEPENDENT_AMBULATORY_CARE_PROVIDER_SITE_OTHER): Payer: 59 | Admitting: Nurse Practitioner

## 2014-08-12 ENCOUNTER — Encounter: Payer: Self-pay | Admitting: Nurse Practitioner

## 2014-08-12 VITALS — BP 122/79 | HR 101 | Temp 98.0°F | Ht 65.0 in | Wt 142.8 lb

## 2014-08-12 DIAGNOSIS — M545 Low back pain, unspecified: Secondary | ICD-10-CM | POA: Insufficient documentation

## 2014-08-12 DIAGNOSIS — M25519 Pain in unspecified shoulder: Secondary | ICD-10-CM

## 2014-08-12 DIAGNOSIS — M25512 Pain in left shoulder: Secondary | ICD-10-CM

## 2014-08-12 MED ORDER — MELOXICAM 15 MG PO TABS: 15.0000 mg | ORAL_TABLET | Freq: Every day | ORAL | Status: DC

## 2014-08-12 NOTE — Progress Notes (Signed)
Pre visit review using our clinic review tool, if applicable. No additional management support is needed unless otherwise documented below in the visit note. 

## 2014-08-12 NOTE — Patient Instructions (Signed)
Take prescribed medicine once daily with food. See orthopedist. Do back stretches daily.  Do Range of motion exercises in shoulder several times daily to prevent frozen shoulder. Schedule physical at your convenience.  Exercises Back exercises help treat and prevent back injuries. The goal of back exercises is to increase the strength of your abdominal and back muscles and the flexibility of your back. These exercises should be started when you no longer have back pain. Back exercises include:  Pelvic Tilt. Lie on your back with your knees bent. Tilt your pelvis until the lower part of your back is against the floor. Hold this position 5 to 10 sec and repeat 5 to 10 times.  Knee to Chest. Pull first 1 knee up against your chest and hold for 20 to 30 seconds, repeat this with the other knee, and then both knees. This may be done with the other leg straight or bent, whichever feels better.  Sit-Ups or Curl-Ups. Bend your knees 90 degrees. Start with tilting your pelvis, and do a partial, slow sit-up, lifting your trunk only 30 to 45 degrees off the floor. Take at least 2 to 3 seconds for each sit-up. Do not do sit-ups with your knees out straight. If partial sit-ups are difficult, simply do the above but with only tightening your abdominal muscles and holding it as directed.  Hip-Lift. Lie on your back with your knees flexed 90 degrees. Push down with your feet and shoulders as you raise your hips a couple inches off the floor; hold for 10 seconds, repeat 5 to 10 times. Do not overdo your exercises, especially in the beginning. Exercises may cause you some mild back discomfort which lasts for a few minutes; however, if the pain is more severe, or lasts for more than 15 minutes, do not continue exercises until you see your caregiver. Improvement with exercise therapy for back problems is slow.  See your caregivers for assistance with developing a proper back exercise program. Document Released:  12/08/2004 Document Revised: 01/23/2012 Document Reviewed: 09/01/2011 [facility] Patient Information 2015 [employer]. This information is not intended to replace advice given to you by your health care provider. Make sure you discuss any questions you have with your health care provider.

## 2014-08-12 NOTE — Progress Notes (Signed)
Subjective:     Suzanne Jarvis is a 23 y.o. female presents w/c/o L shoulder pain & low back pain for 1 week. She is new to me, but is an established pt with [facility]. 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 She is R hand dominant. After working several hrs yesterday, L shoulder pain increased. She has been taking 400 mg ibuprophen 4 times daily with minimal relief in back & no relief in shoulder. On exam, she has bilat SI joint tenderness & L shoulder is visibly larger than right. No warmth. See below for complete assessment.  Prednisone has caused AGGRESSIVE BEHAVIOR IN PAST. I will treat w/anti-inflammatory, exercises, & refer to ortho for further eval & treatment.  The following portions of the patient's history were reviewed and updated as appropriate: allergies, current medications, past family history, past medical history, past social history, past surgical history and problem list.  Review of Systems Pertinent items are noted in HPI.    Objective:    BP 122/79  Pulse 101  Temp(Src) 98 F (36.7 C) (Oral)  Ht 5\' 5"  (1.651 m)  Wt 142 lb 12.8 oz (64.774 kg)  BMI 23.76 kg/m2  SpO2 97% BP 122/79  Pulse 101  Temp(Src) 98 F (36.7 C) (Oral)  Ht 5\' 5"  (1.651 m)  Wt 142 lb 12.8 oz (64.774 kg)  BMI 23.76 kg/m2  SpO2 97% General appearance: alert, cooperative, appears stated age and no distress Head: Normocephalic, without obvious abnormality, atraumatic Eyes: negative findings: lids and lashes normal and conjunctivae and sclerae normal Back: symmetric, no curvature. ROM normal. No CVA tenderness., SI joint tenderness, bilat. Tender over upper lumberspine. FROM Extremities: L scarf sign pos. mild pain w/posterior extension, tender anterior shoulder. L shoulder larger than R. No redness or warmth. Pulses: 2+ and symmetric    Assessment:   1. Pain in joint, shoulder region, left - meloxicam  (MOBIC) 15 MG tablet; Take 1 tablet (15 mg total) by mouth daily.  Dispense: 30 tablet; Refill: 0 - Ambulatory referral to Orthopedic Surgery  2. Bilateral low back pain without sciatica - meloxicam (MOBIC) 15 MG tablet; Take 1 tablet (15 mg total) by mouth daily.  Dispense: 30 tablet; Refill: 0 - Ambulatory referral to Orthopedic Surgery

## 2014-08-13 ENCOUNTER — Telehealth: Payer: Self-pay | Admitting: Nurse Practitioner

## 2014-08-13 NOTE — Telephone Encounter (Signed)
emmi emailed °

## 2014-09-05 ENCOUNTER — Ambulatory Visit (INDEPENDENT_AMBULATORY_CARE_PROVIDER_SITE_OTHER): Payer: 59

## 2014-09-05 DIAGNOSIS — Z23 Encounter for immunization: Secondary | ICD-10-CM

## 2014-09-23 ENCOUNTER — Ambulatory Visit (INDEPENDENT_AMBULATORY_CARE_PROVIDER_SITE_OTHER): Payer: 59 | Admitting: Nurse Practitioner

## 2014-09-23 ENCOUNTER — Encounter: Payer: Self-pay | Admitting: Nurse Practitioner

## 2014-09-23 VITALS — BP 98/60 | HR 104 | Temp 98.3°F | Ht 65.0 in | Wt 140.0 lb

## 2014-09-23 DIAGNOSIS — R058 Other specified cough: Secondary | ICD-10-CM

## 2014-09-23 DIAGNOSIS — R05 Cough: Secondary | ICD-10-CM

## 2014-09-23 MED ORDER — BENZONATATE 100 MG PO CAPS: ORAL_CAPSULE | ORAL | Status: DC

## 2014-09-23 NOTE — Progress Notes (Signed)
Pre visit review using our clinic review tool, if applicable. No additional management support is needed unless otherwise documented below in the visit note. 

## 2014-09-23 NOTE — Patient Instructions (Signed)
You have a cold virus causing your symptoms. The average duration of cold symptoms is 14 days. Start daily sinus rinses (neilmed Sinus Rinse). Use 30 mg to 60 mg pseudoephedrine twice daily. Take benzonatate capsules if needed for dry cough. Sip fluids every hour. Rest. If you are not feeling better in 1 week or develop fever or chest pain, call us for re-evaluation. Feel better!  Upper Respiratory Infection, Adult An upper respiratory infection (URI) is also sometimes known as the common cold. The upper respiratory tract includes the nose, sinuses, throat, trachea, and bronchi. Bronchi are the airways leading to the lungs. Most people improve within 1 week, but symptoms can last up to 2 weeks. A residual cough may last even longer.  CAUSES Many different viruses can infect the tissues lining the upper respiratory tract. The tissues become irritated and inflamed and often become very moist. Mucus production is also common. A cold is contagious. You can easily spread the virus to others by oral contact. This includes kissing, sharing a glass, coughing, or sneezing. Touching your mouth or nose and then touching a surface, which is then touched by another person, can also spread the virus. SYMPTOMS  Symptoms typically develop 1 to 3 days after you come in contact with a cold virus. Symptoms vary from person to person. They may include:  Runny nose.  Sneezing.  Nasal congestion.  Sinus irritation.  Sore throat.  Loss of voice (laryngitis).  Cough.  Fatigue.  Muscle aches.  Loss of appetite.  Headache.  Low-grade fever. DIAGNOSIS  You might diagnose your own cold based on familiar symptoms, since most people get a cold 2 to 3 times a year. Your caregiver can confirm this based on your exam. Most importantly, your caregiver can check that your symptoms are not due to another disease such as strep throat, sinusitis, pneumonia, asthma, or epiglottitis. Blood tests, throat tests, and X-rays  are not necessary to diagnose a common cold, but they may sometimes be helpful in excluding other more serious diseases. Your caregiver will decide if any further tests are required. RISKS AND COMPLICATIONS  You may be at risk for a more severe case of the common cold if you smoke cigarettes, have chronic heart disease (such as heart failure) or lung disease (such as asthma), or if you have a weakened immune system. The very young and very old are also at risk for more serious infections. Bacterial sinusitis, middle ear infections, and bacterial pneumonia can complicate the common cold. The common cold can worsen asthma and chronic obstructive pulmonary disease (COPD). Sometimes, these complications can require emergency medical care and may be life-threatening. PREVENTION  The best way to protect against getting a cold is to practice good hygiene. Avoid oral or hand contact with people with cold symptoms. Wash your hands often if contact occurs. There is no clear evidence that vitamin C, vitamin E, echinacea, or exercise reduces the chance of developing a cold. However, it is always recommended to get plenty of rest and practice good nutrition. TREATMENT  Treatment is directed at relieving symptoms. There is no cure. Antibiotics are not effective, because the infection is caused by a virus, not by bacteria. Treatment may include:  Increased fluid intake. Sports drinks offer valuable electrolytes, sugars, and fluids.  Breathing heated mist or steam (vaporizer or shower).  Eating chicken soup or other clear broths, and maintaining good nutrition.  Getting plenty of rest.  Using gargles or lozenges for comfort.  Controlling fevers with ibuprofen  or acetaminophen as directed by your caregiver.  Increasing usage of your inhaler if you have asthma. Zinc gel and zinc lozenges, taken in the first 24 hours of the common cold, can shorten the duration and lessen the severity of symptoms. Pain medicines  may help with fever, muscle aches, and throat pain. A variety of non-prescription medicines are available to treat congestion and runny nose. Your caregiver can make recommendations and may suggest nasal or lung inhalers for other symptoms.  HOME CARE INSTRUCTIONS   Only take over-the-counter or prescription medicines for pain, discomfort, or fever as directed by your caregiver.  Use a warm mist humidifier or inhale steam from a shower to increase air moisture. This may keep secretions moist and make it easier to breathe.  Drink enough water and fluids to keep your urine clear or pale yellow.  Rest as needed.  Return to work when your temperature has returned to normal or as your caregiver advises. You may need to stay home longer to avoid infecting others. You can also use a face mask and careful hand washing to prevent spread of the virus. SEEK MEDICAL CARE IF:   After the first few days, you feel you are getting worse rather than better.  You need your caregiver's advice about medicines to control symptoms.  You develop chills, worsening shortness of breath, or brown or red sputum. These may be signs of pneumonia.  You develop yellow or brown nasal discharge or pain in the face, especially when you bend forward. These may be signs of sinusitis.  You develop a fever, swollen neck glands, pain with swallowing, or white areas in the back of your throat. These may be signs of strep throat. SEEK IMMEDIATE MEDICAL CARE IF:   You have a fever.  You develop severe or persistent headache, ear pain, sinus pain, or chest pain.  You develop wheezing, a prolonged cough, cough up blood, or have a change in your usual mucus (if you have chronic lung disease).  You develop sore muscles or a stiff neck. Document Released: 04/26/2001 Document Revised: 01/23/2012 Document Reviewed: 03/04/2011 [facility] Patient Information 2014 [place], [place].

## 2014-09-23 NOTE — Progress Notes (Signed)
   Subjective:    Patient ID: Suzanne Jarvis, female    DOB: [birth year], 23 y.o.   MRN: [id number]  Cough This is a new problem. The current episode started yesterday. The problem has been unchanged. The cough is non-productive. Associated symptoms include ear congestion, headaches, nasal congestion, postnasal drip and a sore throat. Pertinent negatives include no chest pain, chills, ear pain, fever, shortness of breath or wheezing. Nothing aggravates the symptoms. Treatments tried: phenylephrine. The treatment provided no relief. Her past medical history is significant for asthma.      Review of Systems  Constitutional: Negative for fever, chills and fatigue.  HENT: Positive for congestion, postnasal drip and sore throat. Negative for ear pain.   Respiratory: Positive for cough. Negative for chest tightness, shortness of breath and wheezing.   Cardiovascular: Negative for chest pain.  Gastrointestinal: Negative for abdominal pain.  Musculoskeletal: Negative for back pain.  Neurological: Positive for headaches.       Objective:   Physical Exam  Constitutional: She is oriented to person, place, and time. She appears well-developed and well-nourished. No distress.  HENT:  Head: Normocephalic and atraumatic.  Right Ear: External ear normal.  Left Ear: External ear normal.  Mouth/Throat: No oropharyngeal exudate.  Mild erythema posterior pharynx   Eyes: Conjunctivae are normal. Right eye exhibits no discharge. Left eye exhibits no discharge.  Neck: Normal range of motion. Neck supple. No thyromegaly present.  Cardiovascular: Normal rate, regular rhythm and normal heart sounds.   No murmur heard. Pulmonary/Chest: Effort normal and breath sounds normal. No respiratory distress. She has no wheezes. She has no rales.  Abdominal: Soft.  Lymphadenopathy:    She has cervical adenopathy (swollen tonsillar nodes, bilat.).  Neurological: She is alert and oriented to person, place, and time.   Skin: Skin is warm and dry.  Psychiatric: She has a normal mood and affect. Her behavior is normal. Thought content normal.  Vitals reviewed.         Assessment & Plan:  1. Respiratory tract congestion with cough - benzonatate (TESSALON) 100 MG capsule; Take 1-2 capsules po up to 3 times daily PRN cough  Dispense: 60 capsule; Refill: 0  See patient instructions for complete plan. F/u prn

## 2014-12-04 ENCOUNTER — Ambulatory Visit (INDEPENDENT_AMBULATORY_CARE_PROVIDER_SITE_OTHER): Payer: BLUE CROSS/BLUE SHIELD | Admitting: Nurse Practitioner

## 2014-12-04 ENCOUNTER — Encounter: Payer: Self-pay | Admitting: Nurse Practitioner

## 2014-12-04 VITALS — BP 112/73 | HR 78 | Temp 97.8°F | Ht 65.0 in | Wt 141.0 lb

## 2014-12-04 DIAGNOSIS — R197 Diarrhea, unspecified: Secondary | ICD-10-CM

## 2014-12-04 LAB — CBC WITH DIFFERENTIAL/PLATELET
BASOS ABS: 0 10*3/uL (ref 0.0–0.1)
BASOS PCT: 0.4 % (ref 0.0–3.0)
EOS ABS: 0.2 10*3/uL (ref 0.0–0.7)
Eosinophils Relative: 4.1 % (ref 0.0–5.0)
HEMATOCRIT: 41.3 % (ref 36.0–46.0)
HEMOGLOBIN: 14.4 g/dL (ref 12.0–15.0)
LYMPHS ABS: 1.7 10*3/uL (ref 0.7–4.0)
LYMPHS PCT: 30.9 % (ref 12.0–46.0)
MCHC: 34.8 g/dL (ref 30.0–36.0)
MCV: 91.4 fl (ref 78.0–100.0)
Monocytes Absolute: 0.3 10*3/uL (ref 0.1–1.0)
Monocytes Relative: 5.3 % (ref 3.0–12.0)
Neutro Abs: 3.3 10*3/uL (ref 1.4–7.7)
Neutrophils Relative %: 59.3 % (ref 43.0–77.0)
PLATELETS: 173 10*3/uL (ref 150.0–400.0)
RBC: 4.52 Mil/uL (ref 3.87–5.11)
RDW: 11.9 % (ref 11.5–15.5)
WBC: 5.6 10*3/uL (ref 4.0–10.5)

## 2014-12-04 LAB — COMPREHENSIVE METABOLIC PANEL
ALT: 12 U/L (ref 0–35)
AST: 17 U/L (ref 0–37)
Albumin: 3.9 g/dL (ref 3.5–5.2)
Alkaline Phosphatase: 57 U/L (ref 39–117)
BUN: 10 mg/dL (ref 6–23)
CALCIUM: 9.5 mg/dL (ref 8.4–10.5)
CO2: 27 mEq/L (ref 19–32)
Chloride: 103 mEq/L (ref 96–112)
Creatinine, Ser: 0.64 mg/dL (ref 0.40–1.20)
GFR: 121.44 mL/min (ref >60.00)
Glucose, Bld: 78 mg/dL (ref 70–99)
POTASSIUM: 4 meq/L (ref 3.5–5.1)
Sodium: 138 mEq/L (ref 135–145)
TOTAL PROTEIN: 6.9 g/dL (ref 6.0–8.3)
Total Bilirubin: 0.4 mg/dL (ref 0.2–1.2)

## 2014-12-04 LAB — VITAMIN B12: Vitamin B-12: 285 pg/mL (ref 211–911)

## 2014-12-04 LAB — TSH: TSH: 2.8 u[IU]/mL (ref 0.35–4.50)

## 2014-12-04 LAB — SEDIMENTATION RATE: SED RATE: 14 mm/h (ref 0–22)

## 2014-12-04 LAB — C-REACTIVE PROTEIN: CRP: 0.6 mg/dL (ref 0.5–20.0)

## 2014-12-04 NOTE — Progress Notes (Signed)
Subjective:     Suzanne Jarvis is a 24 y.o. female who presents for evaluation of diarrhea. Onset of diarrhea was 3 years ago-she recalls going to the bathroom every hour while awake when in school. Now she goes at least 4 times per day. 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 She has occasionally awakened from sleep to have to go to bathroom. She has not had accidents, but has to get to restroom quickly. Patient describes diarrhea as watery-occasionally has formed stool. Diarrhea has been associated with abdominal pain described as sharp and is relieved once she has BM. Patient denies blood in stool, fever, unintentional weight loss. Previous visits for diarrhea: none. Evaluation to date: none. She has used imodium with relief, but it makes abdominal pain worse.  pepto bismol is not effective. Diet consists of 1 cup coffee daily, skips breakfast, eats catered lunch-varies, dinner is usually chicken & boxed sides. She eats 1 or 2 pieces candy every day & drinks water or sweet tea. Rarely eats fresh fruit & vegetables. Quit smoking recently. Pertinent fam hX: MGM has IBS.  The following portions of the patient's history were reviewed and updated as appropriate: allergies, current medications, past family history, past medical history, past social history, past surgical history and problem list.  Review of Systems Constitutional: negative for anorexia, fatigue, fevers, night sweats and weight loss Ears, nose, mouth, throat, and face: negative for hoarseness and sore throat Cardiovascular: negative for palpitations Gastrointestinal: negative for dyspepsia, melena and nausea Genitourinary:negative, has regular MC, on OBCP, diarrhea worse w/MC Integument/breast: negative for rash Musculoskeletal:negative for arthralgias    Objective:    BP 112/73 mmHg  Pulse 78  Temp(Src) 97.8 F (36.6 C) (Temporal)  Ht 5\' 5"  (1.651 m)  Wt 141 lb (63.957  kg)  BMI 23.46 kg/m2  SpO2 100%  LMP 11/03/2014 General: alert, cooperative, appears stated age and no distress  Hydration:  well hydrated  Abdomen: Lungs: Heart: Skin: Eyes:    soft, non-tender; bowel sounds normal; no masses,  no organomegaly  Clear BS bilat RRR, no murmur Clear, pink, fare Lids & lashes clear, conjunctiva & sclera clear.    Assessment:Plan   1. Frequent loose stools WU:[id number]:lactose intolerance, gluten sensitivity, IBD, IBS - Comprehensive metabolic panel - CBC with Differential - Sedimentation rate - C-reactive protein - Iron and TIBC - Vitamin B12 - Tissue transglutaminase, IgA - Endomysial ab scrn + titer, IgA - Reticulin Ab, IgA - TSH  Start daily probiotic Diet changes to avoid potential triggers (lactose & prepackaged foods) & increase fiber-see instructions. Stay hydrated  F/u 2 weeks.

## 2014-12-04 NOTE — Patient Instructions (Signed)
Your symptoms could be caused by several things: lactose intolerance, gluten sensitivity, irritable bowel disease or irritable bowel syndrome.  Lab results will help me determine cause. My office will call with lab results.  In the meantime, increase fiber: eat fresh fruit every day. Start probiotic. You will take every day for at least 3 months. You may [facility].  Best brands are [employer] & Culterelle. Best results are shown with taking both types: take 1 capsule everyday, but alternate brands.  Stay hydrated. When you have loose stools, you need to drink 8 oz. Fluid.  Consider avoiding dairy products to see if symptoms improve. Look for hidden sources like creamy soups, coffee creamers, boxed pastas. You can use almond milk instead of cow's milk.  Avoid pre packaged foods to avoid food triggers.  See you in 2 weeks.

## 2014-12-04 NOTE — Progress Notes (Signed)
Pre visit review using our clinic review tool, if applicable. No additional management support is needed unless otherwise documented below in the visit note. 

## 2014-12-05 LAB — IRON AND TIBC
%SAT: 29 % (ref 20–55)
Iron: 127 ug/dL (ref 42–145)
TIBC: 434 ug/dL (ref 250–470)
UIBC: 307 ug/dL (ref 125–400)

## 2014-12-05 LAB — ENDOMYSIAL AB IGA RFLX TITER: Endomysial Screen: NEGATIVE

## 2014-12-05 LAB — TISSUE TRANSGLUTAMINASE, IGA: TISSUE TRANSGLUTAMINASE AB, IGA: 1 U/mL (ref <4)

## 2014-12-10 ENCOUNTER — Telehealth: Payer: Self-pay | Admitting: Nurse Practitioner

## 2014-12-10 DIAGNOSIS — E538 Deficiency of other specified B group vitamins: Secondary | ICD-10-CM

## 2014-12-10 LAB — RETICULIN AB, IGA: RETICULIN AB, IGA: NEGATIVE {titer}

## 2014-12-10 NOTE — Telephone Encounter (Signed)
Patient notified of results.

## 2014-12-10 NOTE — Telephone Encounter (Signed)
pls call pt: Advise Labs do not give reason for diarrhea, so cause is likely due to food intolerance. She should continue with instructions I gave in office. If no improvement, I can send her to GI for further work up.  Her B12 is low. She should start oral B complex: 2 droppersfull daily. Pls schedule lab appt in 3 mos to check level.

## 2014-12-18 ENCOUNTER — Encounter: Payer: Self-pay | Admitting: Nurse Practitioner

## 2014-12-18 ENCOUNTER — Ambulatory Visit (INDEPENDENT_AMBULATORY_CARE_PROVIDER_SITE_OTHER): Payer: BLUE CROSS/BLUE SHIELD | Admitting: Nurse Practitioner

## 2014-12-18 VITALS — BP 106/70 | HR 72 | Temp 98.2°F | Ht 65.0 in | Wt 144.0 lb

## 2014-12-18 DIAGNOSIS — E538 Deficiency of other specified B group vitamins: Secondary | ICD-10-CM

## 2014-12-18 DIAGNOSIS — K589 Irritable bowel syndrome without diarrhea: Secondary | ICD-10-CM

## 2014-12-18 MED ORDER — HYOSCYAMINE SULFATE ER 0.375 MG PO TB12: ORAL_TABLET | ORAL | Status: DC

## 2014-12-18 NOTE — Patient Instructions (Addendum)
Start Levsin. Take 45 minutes to 1 hour before work. Take every 12 hours if long car ride/travel. Continue probiotic. Continue to eat fruits & vegetables daily. Continue B complex.  See me in 4 weeks to review or sooner if you have concerns.  Irritable Bowel Syndrome Irritable bowel syndrome (IBS) is caused by a disturbance of normal bowel function and is a common digestive disorder. You may also hear this condition called spastic colon, mucous colitis, and irritable colon. There is no cure for IBS. However, symptoms often gradually improve or disappear with a good diet, stress management, and medicine. This condition usually appears in late adolescence or early adulthood. Women develop it twice as often as men. CAUSES  After food has been digested and absorbed in the small intestine, waste material is moved into the large intestine, or colon. In the colon, water and salts are absorbed from the undigested products coming from the small intestine. The remaining residue, or fecal material, is held for elimination. Under normal circumstances, gentle, rhythmic contractions of the bowel walls push the fecal material along the colon toward the rectum. In IBS, however, these contractions are irregular and poorly coordinated. The fecal material is either retained too long, resulting in constipation, or expelled too soon, producing diarrhea. SIGNS AND SYMPTOMS  The most common symptom of IBS is abdominal pain. It is often in the lower left side of the abdomen, but it may occur anywhere in the abdomen. The pain comes from spasms of the bowel muscles happening too much and from the buildup of gas and fecal material in the colon. This pain:  Can range from sharp abdominal cramps to a dull, continuous ache.  Often worsens soon after eating.  Is often relieved by having a bowel movement or passing gas. Abdominal pain is usually accompanied by constipation, but it may also produce diarrhea. The diarrhea often  occurs right after a meal or upon waking up in the morning. The stools are often soft, watery, and flecked with mucus. Other symptoms of IBS include:  Bloating.  Loss of appetite.  Heartburn.  Backache.  Dull pain in the arms or shoulders.  Nausea.  Burping.  Vomiting.  Gas. IBS may also cause symptoms that are unrelated to the digestive system, such as:  Fatigue.  Headaches.  Anxiety.  Shortness of breath.  Trouble concentrating.  Dizziness. These symptoms tend to come and go. DIAGNOSIS  The symptoms of IBS may seem like symptoms of other, more serious digestive disorders. Your health care provider may want to perform tests to exclude these disorders.  TREATMENT Many medicines are available to help correct bowel function or relieve bowel spasms and abdominal pain. Among the medicines available are:  Laxatives for severe constipation and to help restore normal bowel habits.  Specific antidiarrheal medicines to treat severe or lasting diarrhea.  Antispasmodic agents to relieve intestinal cramps. Your health care provider may also decide to treat you with a mild tranquilizer or sedative during unusually stressful periods in your life. Your health care provider may also prescribe antidepressant medicine. The use of this medicine has been shown to reduce pain and other symptoms of IBS. Remember that if any medicine is prescribed for you, you should take it exactly as directed. Make sure your health care provider knows how well it worked for you. HOME CARE INSTRUCTIONS   Take all medicines as directed by your health care provider.  Avoid foods that are high in fat or oils, such as heavy cream, butter, frankfurters,  sausage, and other fatty meats.  Avoid foods that make you go to the bathroom, such as fruit, fruit juice, and dairy products.  Cut out carbonated drinks, chewing gum, and "gassy" foods such as beans and cabbage. This may help relieve bloating and  burping.  Eat foods with bran, and drink plenty of liquids with the bran foods. This helps relieve constipation.  Keep track of what foods seem to bring on your symptoms.  Avoid emotionally charged situations or circumstances that produce anxiety.  Start or continue exercising.  Get plenty of rest and sleep. Document Released: 10/31/2005 Document Revised: 11/05/2013 Document Reviewed: 06/20/2008 [facility] Patient Information 2015 [place], [place]. This information is not intended to replace advice given to you by your health care provider. Make sure you discuss any questions you have with your health care provider.  Diet and Irritable Bowel Syndrome  No cure has been found for irritable bowel syndrome (IBS). Many options are available to treat the symptoms. Your caregiver will give you the best treatments available for your symptoms. He or she will also encourage you to manage stress and to make changes to your diet. You need to work with your caregiver and Registered Dietician to find the best combination of medicine, diet, counseling, and support to control your symptoms. The following are some diet suggestions. FOODS THAT MAKE IBS WORSE  Fatty foods, such as [place] fries.  Milk products, such as cheese or ice cream.  Chocolate.  Alcohol.  Caffeine (found in coffee and some sodas).  Carbonated drinks, such as soda. If certain foods cause symptoms, you should eat less of them or stop eating them. FOOD JOURNAL   Keep a journal of the foods that seem to cause distress. Write down:  What you are eating during the day and when.  What problems you are having after eating.  When the symptoms occur in relation to your meals.  What foods always make you feel badly.  Take your notes with you to your caregiver to see if you should stop eating certain foods. FOODS THAT MAKE IBS BETTER Fiber reduces IBS symptoms, especially constipation, because it makes stools soft, bulky, and easier  to pass. Fiber is found in bran, bread, cereal, beans, fruit, and vegetables. Examples of foods with fiber include:  Apples.  Peaches.  Pears.  Berries.  Figs.  Broccoli, raw.  Cabbage.  Carrots.  Raw peas.  Kidney beans.  Lima beans.  Whole-grain bread.  Whole-grain cereal. Add foods with fiber to your diet a little at a time. This will let your body get used to them. Too much fiber at once might cause gas and swelling of your abdomen. This can trigger symptoms in a person with IBS. Caregivers usually recommend a diet with enough fiber to produce soft, painless bowel movements. High fiber diets may cause gas and bloating. However, these symptoms often go away within a few weeks, as your body adjusts. In many cases, dietary fiber may lessen IBS symptoms, particularly constipation. However, it may not help pain or diarrhea. High fiber diets keep the colon mildly enlarged (distended) with the added fiber. This may help prevent spasms in the colon. Some forms of fiber also keep water in the stool, thereby preventing hard stools that are difficult to pass.  Besides telling you to eat more foods with fiber, your caregiver may also tell you to get more fiber by taking a fiber pill or drinking water mixed with a special high fiber powder. An example of this  is a natural fiber laxative containing psyllium seed.  TIPS  Large meals can cause cramping and diarrhea in people with IBS. If this happens to you, try eating 4 or 5 small meals a day, or try eating less at each of your usual 3 meals. It may also help if your meals are low in fat and high in carbohydrates. Examples of carbohydrates are pasta, rice, whole-grain breads and cereals, fruits, and vegetables.  If dairy products cause your symptoms to flare up, you can try eating less of those foods. You might be able to handle yogurt better than other dairy products, because it contains bacteria that helps with digestion. Dairy products are  an important source of calcium and other nutrients. If you need to avoid dairy products, be sure to talk with a Registered Dietitian about getting these nutrients through other food sources.  Drink enough water and fluids to keep your urine clear or pale yellow. This is important, especially if you have diarrhea. FOR MORE INFORMATION  International Foundation for Functional Gastrointestinal Disorders: www.iffgd.org  National Digestive Diseases Information Clearinghouse: digestive.Stage[employer].siniddk.nih.gov Document Released: 01/21/2004 Document Revised: 01/23/2012 Document Reviewed: 01/31/2014 [facility] Patient Information 2015 [place], [place]. This information is not intended to replace advice given to you by your health care provider. Make sure you discuss any questions you have with your health care provider.

## 2014-12-18 NOTE — Progress Notes (Signed)
Subjective:     Suzanne Jarvis is a 24 y.o. female who presents for follow up of loose stools. She was seen in ofc about 2 weeks ago. Labs were performed to r/o celiac disease & inflammatory bowel. All labs normal. She was found to be B12 deficient-likely due to frequent loose stools.  She started probiotic, increased fiber through fresh fruits & vegetables, and started B complex supplement as directed. She did not eliminate dairy foods, although cut back. She noticed pattern of no diarrhea for 3 days when she was not at work, then frequent stools starting the day she returned to work. She drinks bottled water at work, diet is not different, doesn't drink more coffee. Possibly nervous at work? She isn't sure when asked, but states she gets nervous in crowds & if she goes to new places-even vacation and has more frequent loose stools. She has been at job for almost 2 years. She reports occasionally having to stop to use bathroom on way to work-30 minute drive. States much her life revolves around knowing where bathrooms are. Some times she uses imodium for car trips w/relief.  The following portions of the patient's history were reviewed and updated as appropriate: allergies, current medications, past medical history, past social history, past surgical history and problem list.  Review of Systems Gastrointestinal: negative for nausea Behavioral/Psych: positive for sleep disturbance, negative for bad mood, depression, excessive alcohol consumption and illegal drug usage    Objective:    BP 106/70 mmHg  Pulse 72  Temp(Src) 98.2 F (36.8 C) (Temporal)  Ht 5\' 5"  (1.651 m)  Wt 144 lb (65.318 kg)  BMI 23.96 kg/m2  SpO2 99%  LMP 11/03/2014 General: alert, cooperative, appears stated age and no distress  Hydration:  well hydrated       Assessment:Plan   1. IBS (irritable bowel syndrome), New diarrhea - hyoscyamine (LEVBID) 0.375 MG 12 hr tablet; Take 1T po in morning on work days. May take1T  PO q12h PRN long car ride.  Dispense: 60 tablet; Refill: 1 Discussed potential SE, goal of therapy, mechanism of action Continue probiotic, increase fiber through fruit & veges F/u 4 weeks or sooner if concern w/ new med.  2. Vitamin B 12 deficiency, New Daily B complex to get 2400 mcg B12 Check level in  4 weeks

## 2014-12-18 NOTE — Assessment & Plan Note (Signed)
B complex daily. Check B12 level in 4 weeks

## 2014-12-18 NOTE — Progress Notes (Signed)
Pre visit review using our clinic review tool, if applicable. No additional management support is needed unless otherwise documented below in the visit note. 

## 2014-12-23 ENCOUNTER — Telehealth: Payer: Self-pay | Admitting: *Deleted

## 2014-12-23 NOTE — Telephone Encounter (Signed)
Patient notified. Patient expressed understanding.  

## 2014-12-23 NOTE — Telephone Encounter (Signed)
Patient called office stating that when she started new med hyoscyamine, her face turned red and felt hot (flushing). Patient mom advised her not take med, that she may be having allergic reaction. Patient wanted to know if this is a side effect to hyoscyamine or allergic reaction? Please advise?

## 2014-12-23 NOTE — Telephone Encounter (Signed)
May be allergic reaction. She should use immodium: start with 4 mg, then 2 mg after each loose stool up to 6 doses (no more than 16 mg in 24 hours) on days she goes to work or travels.

## 2015-01-15 ENCOUNTER — Ambulatory Visit (INDEPENDENT_AMBULATORY_CARE_PROVIDER_SITE_OTHER): Payer: BLUE CROSS/BLUE SHIELD | Admitting: Nurse Practitioner

## 2015-01-15 ENCOUNTER — Encounter: Payer: Self-pay | Admitting: Nurse Practitioner

## 2015-01-15 VITALS — BP 112/72 | HR 68 | Temp 98.6°F | Ht 65.0 in | Wt 149.0 lb

## 2015-01-15 DIAGNOSIS — R197 Diarrhea, unspecified: Secondary | ICD-10-CM

## 2015-01-15 DIAGNOSIS — E538 Deficiency of other specified B group vitamins: Secondary | ICD-10-CM

## 2015-01-15 LAB — VITAMIN B12: VITAMIN B 12: 732 pg/mL (ref 211–911)

## 2015-01-15 NOTE — Patient Instructions (Addendum)
My office will call with lab results. Continue imodium as needed. Continue Align for 8 more weeks. Continue to get fresh fruits, vegetables, & whole grains every day. Exercise goal is 30 minutes every day for best health.  Consider doing some reading to manage anxiety.  See me in 8 weeks or sooner if you have concerns.

## 2015-01-15 NOTE — Assessment & Plan Note (Signed)
Continue imodium daily as needed. Continue probiotic & daily fiber through whole foods. Discussed strategies for managing anxiety.

## 2015-01-15 NOTE — Progress Notes (Signed)
Pre visit review using our clinic review tool, if applicable. No additional management support is needed unless otherwise documented below in the visit note. 

## 2015-01-15 NOTE — Progress Notes (Signed)
Subjective:     Suzanne Jarvis is a 24 y.o. female who presents for follow up of diarrhea & B 12 deficiency.  She was seen in offc 6 weeks ago & gave hx of Onset of diarrhea was 3 years ago-she recalls going to the bathroom every hour while awake when in school. Now she goes at least 4 times per day. 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 She has occasionally awakened from sleep to have to go to bathroom. She has not had accidents, but has to get to restroom quickly. Patient describes diarrhea as watery-occasionally has formed stool. Diarrhea has been associated with abdominal pain described as sharp and is relieved once she has BM. Patient denies blood in stool, fever, unintentional weight loss.  Evaluation to date: labs: electrolytes, BUN and creatinine all nml and r/o celiac disease & inflammatory bowel. Treatment to date: tried hyoscamine-had to stop due to rash. Started probiotic, increased fiber &started imodium. Treatment has been effective-she has taken imodium once daily most days & only needed it twice 1 day. She is tolerating well with no constipation or other SE. She associates loose stools with anxiety. We discussed strategies for anxiety management. She does not want meds.  B12-taking oral supplement. Tolerating w/out SE.   The following portions of the patient's history were reviewed and updated as appropriate: allergies, current medications, past medical history, past social history, past surgical history and problem list.  Review of Systems Constitutional: negative for fatigue and fevers Gastrointestinal: negative for nausea Behavioral/Psych: negative for sleep disturbance    Objective:    BP 112/72 mmHg  Pulse 68  Temp(Src) 98.6 F (37 C) (Temporal)  Ht 5\' 5"  (1.651 m)  Wt 149 lb (67.586 kg)  BMI 24.79 kg/m2  SpO2 98%  LMP 01/04/2015 General: alert, cooperative, appears stated age and no distress     Lungs: Heart: Head: Eyes: Neuro: Clear BS bilat RRR no murmur Atraumatic Lids & lashes clear Grossly intact  Abdomen:    soft, non-tender; bowel sounds normal; no masses,  no organomegaly    Assessment:Plan   1. Vitamin B 12 deficiency DD pernicious anemia, frequent loose stools Taking oral supplement - Vitamin B12  2. Frequent loose stools Continue imodium PRN Continue probiotic & daily fiber from whole foods  3.anxiety Use relaxation techniques, anticipatory guidance  F/u 8 weeks

## 2015-01-16 ENCOUNTER — Telehealth: Payer: Self-pay | Admitting: Nurse Practitioner

## 2015-01-16 NOTE — Telephone Encounter (Signed)
pls call pt: Advise B12 looks good. She can cut back to daily.

## 2015-01-16 NOTE — Telephone Encounter (Signed)
Called and informed patient of results.

## 2015-02-04 ENCOUNTER — Telehealth: Payer: Self-pay | Admitting: Family Medicine

## 2015-02-04 NOTE — Telephone Encounter (Signed)
Called and scheduled appointment for tomorrow at 10a.

## 2015-02-04 NOTE — Telephone Encounter (Signed)
Patient called stating that her abd pain is increasing.  Pt states she has been taking tums,  antacid and her probiotics without relief.  She states going to the bathroom used to alleviate the pain some but now it is actually making the pain worse.  Please advise.

## 2015-02-04 NOTE — Telephone Encounter (Signed)
Needs to sched appt. She was fine at f/u-all symtpoms were better.

## 2015-02-05 ENCOUNTER — Ambulatory Visit (INDEPENDENT_AMBULATORY_CARE_PROVIDER_SITE_OTHER): Payer: BLUE CROSS/BLUE SHIELD | Admitting: Nurse Practitioner

## 2015-02-05 ENCOUNTER — Encounter: Payer: Self-pay | Admitting: Nurse Practitioner

## 2015-02-05 VITALS — BP 111/78 | HR 101 | Temp 98.8°F | Ht 65.0 in | Wt 143.0 lb

## 2015-02-05 DIAGNOSIS — A084 Viral intestinal infection, unspecified: Secondary | ICD-10-CM

## 2015-02-05 NOTE — Patient Instructions (Signed)
Your symptoms may be from food poisoning. Do not use imodium for a few days unless you have to because you want virus or bacteria to shed. Let me know if you have concerns. Viral Gastroenteritis Viral gastroenteritis is also known as stomach flu. This condition affects the stomach and intestinal tract. It can cause sudden diarrhea and vomiting. The illness typically lasts 3 to 8 days. Most people develop an immune response that eventually gets rid of the virus. While this natural response develops, the virus can make you quite ill. CAUSES  Many different viruses can cause gastroenteritis, such as rotavirus or noroviruses. You can catch one of these viruses by consuming contaminated food or water. You may also catch a virus by sharing utensils or other personal items with an infected person or by touching a contaminated surface. SYMPTOMS  The most common symptoms are diarrhea and vomiting. These problems can cause a severe loss of body fluids (dehydration) and a body salt (electrolyte) imbalance. Other symptoms may include:  Fever.  Headache.  Fatigue.  Abdominal pain. DIAGNOSIS  Your caregiver can usually diagnose viral gastroenteritis based on your symptoms and a physical exam. A stool sample may also be taken to test for the presence of viruses or other infections. TREATMENT  This illness typically goes away on its own. Treatments are aimed at rehydration. The most serious cases of viral gastroenteritis involve vomiting so severely that you are not able to keep fluids down. In these cases, fluids must be given through an intravenous line (IV). HOME CARE INSTRUCTIONS   Drink enough fluids to keep your urine clear or pale yellow. Drink small amounts of fluids frequently and increase the amounts as tolerated.  Ask your caregiver for specific rehydration instructions.  Avoid:  Foods high in sugar.  Alcohol.  Carbonated drinks.  Tobacco.  Juice.  Caffeine drinks.  Extremely hot  or cold fluids.  Fatty, greasy foods.  Too much intake of anything at one time.  Dairy products until 24 to 48 hours after diarrhea stops.  You may consume probiotics. Probiotics are active cultures of beneficial bacteria. They may lessen the amount and number of diarrheal stools in adults. Probiotics can be found in yogurt with active cultures and in supplements.  Wash your hands well to avoid spreading the virus.  Only take over-the-counter or prescription medicines for pain, discomfort, or fever as directed by your caregiver. Do not give aspirin to children. Antidiarrheal medicines are not recommended.  Ask your caregiver if you should continue to take your regular prescribed and over-the-counter medicines.  Keep all follow-up appointments as directed by your caregiver. SEEK IMMEDIATE MEDICAL CARE IF:   You are unable to keep fluids down.  You do not urinate at least once every 6 to 8 hours.  You develop shortness of breath.  You notice blood in your stool or vomit. This may look like coffee grounds.  You have abdominal pain that increases or is concentrated in one small area (localized).  You have persistent vomiting or diarrhea.  You have a fever.  The patient is a child younger than 3 months, and he or she has a fever.  The patient is a child older than 3 months, and he or she has a fever and persistent symptoms.  The patient is a child older than 3 months, and he or she has a fever and symptoms suddenly get worse.  The patient is a baby, and he or she has no tears when crying. MAKE SURE YOU:  Understand these instructions.  Will watch your condition.  Will get help right away if you are not doing well or get worse. Document Released: 10/31/2005 Document Revised: 01/23/2012 Document Reviewed: 08/17/2011 [facility] Patient Information 2015 [place], [place]. This information is not intended to replace advice given to you by your health care provider. Make sure you  discuss any questions you have with your health care provider.

## 2015-02-05 NOTE — Progress Notes (Signed)
Subjective:     Suzanne Jarvis is a 24 y.o. female who presents for evaluation of pain in upper abdomen & fatigue for 2 days. Symptoms started w/fatigue & mild epigastric pain. Pain became more intense-waking from sleep. She continued to feel tired yesterday w/intense intermittent stabbing pain. She had some loose stools. Pain is better today. She has been able to eat. Patient denies blood in stool, constipation, dark urine, fever and nausea. Patient's oral intake has been normal. Patient denies recent travel history. Patient has had recent ingestion of possible contaminated food, toxic plants, or inappropriate medications/poisons-she ate [employer] tuna the day that symptoms started. There was a recall. She didn't check the UPC on her can. To date there have been no reports of illness from recall ([email or web address]).   The following portions of the patient's history were reviewed and updated as appropriate: allergies, current medications, past medical history, past social history, past surgical history and problem list.  Review of Systems Pertinent items are noted in HPI.    Objective:     BP 111/78 mmHg  Pulse 101  Temp(Src) 98.8 F (37.1 C) (Temporal)  Ht 5\' 5"  (1.651 m)  Wt 143 lb (64.864 kg)  BMI 23.80 kg/m2  SpO2 99%  LMP 01/31/2015 General appearance: alert, cooperative, appears stated age and no distress Head: Normocephalic, without obvious abnormality, atraumatic Eyes: negative findings: lids and lashes normal and conjunctivae and sclerae normal Lungs: clear to auscultation bilaterally Heart: regular rate and rhythm, S1, S2 normal, no murmur, click, rub or gallop Abdomen: soft, non-tender; bowel sounds normal; no masses,  no organomegaly Neurologic: Grossly normal    Assessment:Plan  1. Viral gastroenteritis DD: food poisoning See pt intructions F/u PRN

## 2015-02-05 NOTE — Progress Notes (Signed)
Pre visit review using our clinic review tool, if applicable. No additional management support is needed unless otherwise documented below in the visit note. 

## 2015-03-12 ENCOUNTER — Encounter: Payer: Self-pay | Admitting: Nurse Practitioner

## 2015-03-12 ENCOUNTER — Ambulatory Visit (INDEPENDENT_AMBULATORY_CARE_PROVIDER_SITE_OTHER): Payer: BLUE CROSS/BLUE SHIELD | Admitting: Nurse Practitioner

## 2015-03-12 VITALS — BP 113/74 | HR 88 | Temp 98.2°F | Ht 65.0 in | Wt 145.0 lb

## 2015-03-12 DIAGNOSIS — R197 Diarrhea, unspecified: Secondary | ICD-10-CM | POA: Diagnosis not present

## 2015-03-12 NOTE — Patient Instructions (Signed)
Re-start probiotic if diarrhea becomes more frequent again. Continue to explore food triggers. Read labels.  Continue B complex daily.  Keep your BMi healthy by developing lifelong habits of exercise most days of the week: take a 30 minute walk. The benefits include weight loss, lower risk for heart disease, diabetes, stroke, high blood pressure, lower rates of depression & dementia, better sleep quality & bone health. Limit refined sugar: anything that is sweet when you eat or drink it, except fresh fruit. Cut out refined grains: white bread, rolls, biscuits, bagels, muffins, pasta and cereals. Choose grains with 4 gm or more of fiber per serving.   Let me know if you have concerns, otherwise see me in 1 year for physical.  Nice to see you!

## 2015-03-12 NOTE — Assessment & Plan Note (Signed)
Continue imodium PRN Re-start probiotic if diarrhea becomes frequent again Continue to read labels to ID food triggers

## 2015-03-12 NOTE — Progress Notes (Signed)
Subjective:     Suzanne Jarvis is a 24 y.o. female presents for f/u loose stools. She has had improvement w/taking probiotic for 3 mos. Stools less frequent. Rarely needs immodium. Still has loose stool every am. Does not wake w/diarrhea. She has made some diet changes also. I encourage her to continue to read labels to explore potential diet triggers. W/u neg for infl bowel disease markers, celiac.  The following portions of the patient's history were reviewed and updated as appropriate: allergies, current medications, past medical history, past social history, past surgical history and problem list.  Review of Systems Pertinent items are noted in HPI.    Objective:    BP 113/74 mmHg  Pulse 88  Temp(Src) 98.2 F (36.8 C) (Oral)  Ht 5\' 5"  (1.651 m)  Wt 145 lb (65.772 kg)  BMI 24.13 kg/m2  SpO2 98%  LMP 02/28/2015 BP 113/74 mmHg  Pulse 88  Temp(Src) 98.2 F (36.8 C) (Oral)  Ht 5\' 5"  (1.651 m)  Wt 145 lb (65.772 kg)  BMI 24.13 kg/m2  SpO2 98%  LMP 02/28/2015 General appearance: alert, cooperative, appears stated age and no distress Head: Normocephalic, without obvious abnormality, atraumatic Eyes: negative findings: lids and lashes normal and conjunctivae and sclerae normal Neurologic: Grossly normal    Assessment:Plan   1. Frequent loose stools See prob list F/u PRN 1 yr-CPE

## 2015-03-12 NOTE — Progress Notes (Signed)
Pre visit review using our clinic review tool, if applicable. No additional management support is needed unless otherwise documented below in the visit note. 

## 2015-04-23 ENCOUNTER — Ambulatory Visit (INDEPENDENT_AMBULATORY_CARE_PROVIDER_SITE_OTHER): Payer: BLUE CROSS/BLUE SHIELD | Admitting: Nurse Practitioner

## 2015-04-23 ENCOUNTER — Encounter: Payer: Self-pay | Admitting: Nurse Practitioner

## 2015-04-23 VITALS — BP 126/78 | HR 86 | Temp 98.6°F | Resp 16 | Ht 65.0 in | Wt 144.0 lb

## 2015-04-23 DIAGNOSIS — R238 Other skin changes: Secondary | ICD-10-CM | POA: Diagnosis not present

## 2015-04-23 DIAGNOSIS — H00019 Hordeolum externum unspecified eye, unspecified eyelid: Secondary | ICD-10-CM | POA: Insufficient documentation

## 2015-04-23 DIAGNOSIS — H00016 Hordeolum externum left eye, unspecified eyelid: Secondary | ICD-10-CM | POA: Diagnosis not present

## 2015-04-23 MED ORDER — ERYTHROMYCIN 5 MG/GM OP OINT: 1.0000 "application " | TOPICAL_OINTMENT | Freq: Two times a day (BID) | OPHTHALMIC | Status: DC

## 2015-04-23 NOTE — Patient Instructions (Signed)
Get new eye make up. Use warm wash cloth on eye several times daily for about 3 minutes (as long as it stays hot). You may use eye ointment for about 7 days.  Use bacitracin or neosporin on sore spot in ear. Apply with Qtip for several days. Sty A sty (hordeolum) is an infection of a gland in the eyelid located at the base of the eyelash. A sty may develop a white or yellow head of pus. It can be puffy (swollen). Usually, the sty will burst and pus will come out on its own. They do not leave lumps in the eyelid once they drain. A sty is often confused with another form of cyst of the eyelid called a chalazion. Chalazions occur within the eyelid and not on the edge where the bases of the eyelashes are. They often are red, sore and then form firm lumps in the eyelid. CAUSES   Germs (bacteria).  Lasting (chronic) eyelid inflammation. SYMPTOMS   Tenderness, redness and swelling along the edge of the eyelid at the base of the eyelashes.  Sometimes, there is a white or yellow head of pus. It may or may not drain. DIAGNOSIS  An ophthalmologist will be able to distinguish between a sty and a chalazion and treat the condition appropriately.  TREATMENT   Styes are typically treated with warm packs (compresses) until drainage occurs.  In rare cases, medicines that kill germs (antibiotics) may be prescribed. These antibiotics may be in the form of drops, cream or pills.  If a hard lump has formed, it is generally necessary to do a small incision and remove the hardened contents of the cyst in a minor surgical procedure done in the office.  In suspicious cases, your caregiver may send the contents of the cyst to the lab to be certain that it is not a rare, but dangerous form of cancer of the glands of the eyelid. HOME CARE INSTRUCTIONS   Wash your hands often and dry them with a clean towel. Avoid touching your eyelid. This may spread the infection to other parts of the eye.  Apply heat to your  eyelid for 10 to 20 minutes, several times a day, to ease pain and help to heal it faster.  Do not squeeze the sty. Allow it to drain on its own. Wash your eyelid carefully 3 to 4 times per day to remove any pus. SEEK IMMEDIATE MEDICAL CARE IF:   Your eye becomes painful or puffy (swollen).  Your vision changes.  Your sty does not drain by itself within 3 days.  Your sty comes back within a short period of time, even with treatment.  You have redness (inflammation) around the eye.  You have a fever. Document Released: 08/10/2005 Document Revised: 01/23/2012 Document Reviewed: 02/14/2014 [facility] Patient Information 2015 [place], [place]. This information is not intended to replace advice given to you by your health care provider. Make sure you discuss any questions you have with your health care provider.

## 2015-04-23 NOTE — Progress Notes (Signed)
Pre visit review using our clinic review tool, if applicable. No additional management support is needed unless otherwise documented below in the visit note. 

## 2015-04-23 NOTE — Progress Notes (Signed)
Subjective:     Suzanne Jarvis is a 24 y.o. female presents w/new complaint of "stye" on L eye & "sore" in ear.  Stye: L eye, onset 2 dys, slightly tender, no drainage or visionchange. Sore R ear: onset 2 dys. Hurts to put finger in ear & press pinna. Denies drainage, nasal congestion, cough, fever.   The following portions of the patient's history were reviewed and updated as appropriate: allergies, current medications, past medical history, past social history, past surgical history and problem list.  Review of Systems Pertinent items are noted in HPI.    Objective:    BP 126/78 mmHg  Pulse 86  Temp(Src) 98.6 F (37 C) (Temporal)  Resp 16  Ht 5\' 5"  (1.651 m)  Wt 144 lb (65.318 kg)  BMI 23.96 kg/m2  SpO2 98% BP 126/78 mmHg  Pulse 86  Temp(Src) 98.6 F (37 C) (Temporal)  Resp 16  Ht 5\' 5"  (1.651 m)  Wt 144 lb (65.318 kg)  BMI 23.96 kg/m2  SpO2 98% General appearance: alert, cooperative, appears stated age and no distress Head: Normocephalic, without obvious abnormality, atraumatic Eyes: negative findings: conjunctivae and sclerae normal, corneas clear, pupils equal, round, reactive to light and accomodation and visual fields full to confrontation, positive findings: eyelids/periorbital: inner upper lid tender, slightly swollen & erythematous. Cannot palpate nodule. No drainage from eye. lashes clear. Ears: normal TM and external ear canal left ear and abnormal external canal right ear - pimple in canal-slightly red & swollen, localized. TM nml. Neurologic: Grossly normal    Assessment:Plan      1. Hordeolum externum (stye), left Warm compress Change eye make-up - erythromycin (ROMYCIN) ophthalmic ointment; Place 1 application into the left eye 2 (two) times daily.  Dispense: 3.5 g; Refill: 0  2. Pimple in ear R Apply bacitracin twice daily for several days or until soreness resolves.  F/u PRN

## 2015-04-28 ENCOUNTER — Ambulatory Visit (INDEPENDENT_AMBULATORY_CARE_PROVIDER_SITE_OTHER): Payer: BLUE CROSS/BLUE SHIELD | Admitting: Nurse Practitioner

## 2015-04-28 ENCOUNTER — Encounter: Payer: Self-pay | Admitting: Nurse Practitioner

## 2015-04-28 VITALS — BP 117/82 | HR 92 | Temp 97.9°F | Resp 16 | Ht 65.0 in | Wt 143.0 lb

## 2015-04-28 DIAGNOSIS — J069 Acute upper respiratory infection, unspecified: Secondary | ICD-10-CM | POA: Diagnosis not present

## 2015-04-28 DIAGNOSIS — H65192 Other acute nonsuppurative otitis media, left ear: Secondary | ICD-10-CM | POA: Diagnosis not present

## 2015-04-28 MED ORDER — AZITHROMYCIN 250 MG PO TABS: ORAL_TABLET | ORAL | Status: DC

## 2015-04-28 MED ORDER — HYDROCODONE-HOMATROPINE 5-1.5 MG/5ML PO SYRP: 5.0000 mL | ORAL_SOLUTION | Freq: Every evening | ORAL | Status: DC | PRN

## 2015-04-28 NOTE — Progress Notes (Signed)
Pre visit review using our clinic review tool, if applicable. No additional management support is needed unless otherwise documented below in the visit note. 

## 2015-04-28 NOTE — Patient Instructions (Signed)
You have a virus causing your cough, runny nose & sore throat. Unfortunately, you are developing a secondary ear infection as well. Ear should start feeling better in a few days, The average duration of cold symptoms is 14 days.  For nasal congestion & cough: Start daily sinus rinses (neilmed Sinus Rinse). Use 30 mg to 60 mg pseudoephedrine 2 to 3 times daily. Vicks vapor rub under nose to help breathe.  Use cough syrup at night. Tylenol or ibuprophen for headache. Sip fluids every hour. Rest for a day or so. If you are not feeling better in 10 days or develop fever or chest pain, call us for re-evaluation. Feel better!  Upper Respiratory Infection, Adult An upper respiratory infection (URI) is also sometimes known as the common cold. The upper respiratory tract includes the nose, sinuses, throat, trachea, and bronchi. Bronchi are the airways leading to the lungs. Most people improve within 1 week, but symptoms can last up to 2 weeks. A residual cough may last even longer.  CAUSES Many different viruses can infect the tissues lining the upper respiratory tract. The tissues become irritated and inflamed and often become very moist. Mucus production is also common. A cold is contagious. You can easily spread the virus to others by oral contact. This includes kissing, sharing a glass, coughing, or sneezing. Touching your mouth or nose and then touching a surface, which is then touched by another person, can also spread the virus. SYMPTOMS  Symptoms typically develop 1 to 3 days after you come in contact with a cold virus. Symptoms vary from person to person. They may include:  Runny nose.  Sneezing.  Nasal congestion.  Sinus irritation.  Sore throat.  Loss of voice (laryngitis).  Cough.  Fatigue.  Muscle aches.  Loss of appetite.  Headache.  Low-grade fever. DIAGNOSIS  You might diagnose your own cold based on familiar symptoms, since most people get a cold 2 to 3 times a year.  Your caregiver can confirm this based on your exam. Most importantly, your caregiver can check that your symptoms are not due to another disease such as strep throat, sinusitis, pneumonia, asthma, or epiglottitis. Blood tests, throat tests, and X-rays are not necessary to diagnose a common cold, but they may sometimes be helpful in excluding other more serious diseases. Your caregiver will decide if any further tests are required. RISKS AND COMPLICATIONS  You may be at risk for a more severe case of the common cold if you smoke cigarettes, have chronic heart disease (such as heart failure) or lung disease (such as asthma), or if you have a weakened immune system. The very young and very old are also at risk for more serious infections. Bacterial sinusitis, middle ear infections, and bacterial pneumonia can complicate the common cold. The common cold can worsen asthma and chronic obstructive pulmonary disease (COPD). Sometimes, these complications can require emergency medical care and may be life-threatening. PREVENTION  The best way to protect against getting a cold is to practice good hygiene. Avoid oral or hand contact with people with cold symptoms. Wash your hands often if contact occurs. There is no clear evidence that vitamin C, vitamin E, echinacea, or exercise reduces the chance of developing a cold. However, it is always recommended to get plenty of rest and practice good nutrition. TREATMENT  Treatment is directed at relieving symptoms. There is no cure. Antibiotics are not effective, because the infection is caused by a virus, not by bacteria. Treatment may include:  Increased  fluid intake. Sports drinks offer valuable electrolytes, sugars, and fluids.  Breathing heated mist or steam (vaporizer or shower).  Eating chicken soup or other clear broths, and maintaining good nutrition.  Getting plenty of rest.  Using gargles or lozenges for comfort.  Controlling fevers with ibuprofen or  acetaminophen as directed by your caregiver.  Increasing usage of your inhaler if you have asthma. Zinc gel and zinc lozenges, taken in the first 24 hours of the common cold, can shorten the duration and lessen the severity of symptoms. Pain medicines may help with fever, muscle aches, and throat pain. A variety of non-prescription medicines are available to treat congestion and runny nose. Your caregiver can make recommendations and may suggest nasal or lung inhalers for other symptoms.  HOME CARE INSTRUCTIONS   Only take over-the-counter or prescription medicines for pain, discomfort, or fever as directed by your caregiver.  Use a warm mist humidifier or inhale steam from a shower to increase air moisture. This may keep secretions moist and make it easier to breathe.  Drink enough water and fluids to keep your urine clear or pale yellow.  Rest as needed.  Return to work when your temperature has returned to normal or as your caregiver advises. You may need to stay home longer to avoid infecting others. You can also use a face mask and careful hand washing to prevent spread of the virus. SEEK MEDICAL CARE IF:   After the first few days, you feel you are getting worse rather than better.  You need your caregiver's advice about medicines to control symptoms.  You develop chills, worsening shortness of breath, or brown or red sputum. These may be signs of pneumonia.  You develop yellow or brown nasal discharge or pain in the face, especially when you bend forward. These may be signs of sinusitis.  You develop a fever, swollen neck glands, pain with swallowing, or white areas in the back of your throat. These may be signs of strep throat. SEEK IMMEDIATE MEDICAL CARE IF:   You have a fever.  You develop severe or persistent headache, ear pain, sinus pain, or chest pain.  You develop wheezing, a prolonged cough, cough up blood, or have a change in your usual mucus (if you have chronic lung  disease).  You develop sore muscles or a stiff neck. Document Released: 04/26/2001 Document Revised: 01/23/2012 Document Reviewed: 03/04/2011 [facility] Patient Information 2014 [place], [place].   Otitis Media Otitis media is redness, soreness, and inflammation of the middle ear. Otitis media may be caused by allergies or, most commonly, by infection. Often it occurs as a complication of the common cold. SIGNS AND SYMPTOMS Symptoms of otitis media may include:  Earache.  Fever.  Ringing in your ear.  Headache.  Leakage of fluid from the ear. DIAGNOSIS To diagnose otitis media, your health care provider will examine your ear with an otoscope. This is an instrument that allows your health care provider to see into your ear in order to examine your eardrum. Your health care provider also will ask you questions about your symptoms. TREATMENT  Typically, otitis media resolves on its own within 3-5 days. Your health care provider may prescribe medicine to ease your symptoms of pain. If otitis media does not resolve within 5 days or is recurrent, your health care provider may prescribe antibiotic medicines if he or she suspects that a bacterial infection is the cause. HOME CARE INSTRUCTIONS   If you were prescribed an antibiotic medicine, finish it  all even if you start to feel better.  Take medicines only as directed by your health care provider.  Keep all follow-up visits as directed by your health care provider. SEEK MEDICAL CARE IF:  You have otitis media only in one ear, or bleeding from your nose, or both.  You notice a lump on your neck.  You are not getting better in 3-5 days.  You feel worse instead of better. SEEK IMMEDIATE MEDICAL CARE IF:   You have pain that is not controlled with medicine.  You have swelling, redness, or pain around your ear or stiffness in your neck.  You notice that part of your face is paralyzed.  You notice that the bone behind your ear  (mastoid) is tender when you touch it. MAKE SURE YOU:   Understand these instructions.  Will watch your condition.  Will get help right away if you are not doing well or get worse. Document Released: 08/05/2004 Document Revised: 03/17/2014 Document Reviewed: 05/28/2013 [facility] Patient Information 2015 [place], [place]. This information is not intended to replace advice given to you by your health care provider. Make sure you discuss any questions you have with your health care provider.

## 2015-04-28 NOTE — Progress Notes (Signed)
   Subjective:    Patient ID: Suzanne Jarvis, female    DOB: 06-Jan-1991, 24 y.o.   MRN: [id number]  Cough This is a new problem. The current episode started in the past 7 days (3d). The problem has been gradually worsening. The problem occurs every few minutes. The cough is productive of sputum. Associated symptoms include ear congestion, nasal congestion, postnasal drip, a sore throat and wheezing. Pertinent negatives include no chest pain, chills, ear pain, fever, headaches or shortness of breath. The symptoms are aggravated by lying down. She has tried OTC cough suppressant for the symptoms. The treatment provided no relief.      Review of Systems  Constitutional: Negative for fever and chills.  HENT: Positive for postnasal drip and sore throat. Negative for ear pain.   Respiratory: Positive for cough and wheezing. Negative for shortness of breath.   Cardiovascular: Negative for chest pain.  Neurological: Negative for headaches.       Objective:   Physical Exam  Constitutional: She is oriented to person, place, and time. She appears well-developed and well-nourished. No distress.  HENT:  Right Ear: Tympanic membrane, external ear and ear canal normal.  Left Ear: External ear and ear canal normal. Tympanic membrane is injected, erythematous and bulging.  Mouth/Throat: Oropharynx is clear and moist. No oropharyngeal exudate.  Cannot visualize bones in L TM  Eyes: Conjunctivae are normal. Right eye exhibits no discharge. Left eye exhibits no discharge.  Neck: Normal range of motion. Neck supple. No thyromegaly present.  Cardiovascular: Normal rate, regular rhythm and normal heart sounds.   No murmur heard. Pulmonary/Chest: Effort normal and breath sounds normal.  Lymphadenopathy:    She has no cervical adenopathy.  Neurological: She is alert and oriented to person, place, and time.  Skin: Skin is warm and dry.  Psychiatric: She has a normal mood and affect. Her behavior is  normal. Thought content normal.  Vitals reviewed.         Assessment & Plan:  1. Acute nonsuppurative otitis media of left ear new - azithromycin (ZITHROMAX) 250 MG tablet; Take 2T PO day1, then 1T PO days 2-5.  Dispense: 6 tablet; Refill: 0  2. Upper respiratory infection with cough and congestion new - HYDROcodone-homatropine (HYCODAN) 5-1.5 MG/5ML syrup; Take 5 mLs by mouth at bedtime as needed for cough.  Dispense: 120 mL; Refill: 0 Daily sinus wash Pseudoephedrine See pt instructions F/u PRN

## 2015-07-15 ENCOUNTER — Telehealth: Payer: Self-pay | Admitting: Family Medicine

## 2015-07-15 ENCOUNTER — Ambulatory Visit (INDEPENDENT_AMBULATORY_CARE_PROVIDER_SITE_OTHER): Payer: BLUE CROSS/BLUE SHIELD | Admitting: Family Medicine

## 2015-07-15 ENCOUNTER — Encounter: Payer: Self-pay | Admitting: Family Medicine

## 2015-07-15 VITALS — BP 127/85 | HR 91 | Temp 98.1°F | Wt 141.0 lb

## 2015-07-15 DIAGNOSIS — A6920 Lyme disease, unspecified: Secondary | ICD-10-CM | POA: Diagnosis not present

## 2015-07-15 LAB — CBC WITH DIFFERENTIAL/PLATELET
Basophils Absolute: 0 10*3/uL (ref 0.0–0.1)
Basophils Relative: 0.5 % (ref 0.0–3.0)
EOS PCT: 6.8 % — AB (ref 0.0–5.0)
Eosinophils Absolute: 0.4 10*3/uL (ref 0.0–0.7)
HCT: 43.5 % (ref 36.0–46.0)
Hemoglobin: 15.1 g/dL — ABNORMAL HIGH (ref 12.0–15.0)
LYMPHS ABS: 1.2 10*3/uL (ref 0.7–4.0)
Lymphocytes Relative: 20.9 % (ref 12.0–46.0)
MCHC: 34.6 g/dL (ref 30.0–36.0)
MCV: 92.3 fl (ref 78.0–100.0)
MONO ABS: 0.4 10*3/uL (ref 0.1–1.0)
MONOS PCT: 7.6 % (ref 3.0–12.0)
NEUTROS ABS: 3.7 10*3/uL (ref 1.4–7.7)
NEUTROS PCT: 64.2 % (ref 43.0–77.0)
PLATELETS: 174 10*3/uL (ref 150.0–400.0)
RBC: 4.72 Mil/uL (ref 3.87–5.11)
RDW: 12.6 % (ref 11.5–15.5)
WBC: 5.8 10*3/uL (ref 4.0–10.5)

## 2015-07-15 LAB — SEDIMENTATION RATE: SED RATE: 18 mm/h (ref 0–22)

## 2015-07-15 MED ORDER — DOXYCYCLINE HYCLATE 100 MG PO TABS: 100.0000 mg | ORAL_TABLET | Freq: Two times a day (BID) | ORAL | Status: AC

## 2015-07-15 MED ORDER — HYDROXYZINE PAMOATE 25 MG PO CAPS: 25.0000 mg | ORAL_CAPSULE | Freq: Three times a day (TID) | ORAL | Status: DC | PRN

## 2015-07-15 NOTE — Telephone Encounter (Signed)
Please call pt, her lab work did not result with any major concerns. It did show increased in her a white blood cell that raises with exposure to insects/parasites or allergies. This is to be expected with her history of possible tick exposure. I will explain in more detail at her follow up appt if she has additional questions.

## 2015-07-15 NOTE — Progress Notes (Signed)
Subjective:    Patient ID: Suzanne Jarvis, female    DOB: [birth year], 24 y.o.   MRN: [id number]  HPI  Rash: Patient presents for acute visit today for a rash that she states developed yesterday morning and was present when she woke up. The rash is located on her left belt line, and right medial upper arm. She states that the rash is expanding on her arm and belt line. She reports that he is itchy around the outside of the rash, and burning/painful in the middle of the rash and when she touches it. She has never had a rash like this in the past. She is states she's had multiple bug bites since staying at her friend's house over the last month. She reports the initial blood bite was on her right shoulder and right medial ankle. Those areas healed, but now the initial bug bite on her right ankle has a red ring around it with a central clearing. When the red ring appeared yesterday morning she also had new red circular rash on her medial right upper arm and left belt line. She reports feeling bug bites in the center of her back as well. She has had exposure to the outdoor/trees. She has tried to place ice, cortisone cream, calamine lotion and take Benadryl by mouth to help with her symptoms, without resolution. She reports the lesion on her arm is getting bigger with the last 24 hours. She denies any fever, headaches, nausea, vomiting, weakness or fatigue. Patient denies any new medications. Patient is on birth control.  Past Medical History  Diagnosis Date  . Tonsillitis   . Asthma   . IBS (irritable bowel syndrome)   . Chronic headaches    Allergies  Allergen Reactions  . Amoxicillin   . Penicillins     REACTION: hives   No past surgical history on file. Social History   Social History  . Marital Status: Divorced    Spouse Name: N/A  . Number of Children: 0  . Years of Education: N/A   Occupational History  . cosmetologist    Social History Main Topics  . Smoking status: Current  Every Day Smoker -- 0.25 packs/day for 6 years    Types: Cigarettes  . Smokeless tobacco: Not on file  . Alcohol Use: Yes     Comment: rare  . Drug Use: No  . Sexual Activity: Not on file   Other Topics Concern  . Not on file   Social History Narrative    Review of Systems  Constitutional: Negative for fever, chills, diaphoresis, activity change, appetite change and fatigue.  HENT: Negative for ear pain, postnasal drip, rhinorrhea, sinus pressure, tinnitus, trouble swallowing and voice change.   Eyes: Negative for pain, discharge, redness, itching and visual disturbance.  Respiratory: Negative for cough and shortness of breath.   Gastrointestinal: Negative for nausea, vomiting, abdominal pain, diarrhea and constipation.  Musculoskeletal: Negative for myalgias, arthralgias, neck pain and neck stiffness.  Skin: Positive for color change and rash.  Allergic/Immunologic: Negative for environmental allergies, food allergies and immunocompromised state.  Neurological: Negative for dizziness, syncope, speech difficulty, weakness, light-headedness, numbness and headaches.  Hematological: Negative for adenopathy.  Psychiatric/Behavioral: Negative for confusion.      Objective:   Physical Exam BP 127/85 mmHg  Pulse 91  Temp(Src) 98.1 F (36.7 C) (Oral)  Wt 141 lb (63.957 kg)  SpO2 97%  LMP 06/14/2015  Body mass index is 23.46 kg/(m^2). Gen: Afebrile. No acute distress. Nontoxic  in appearance, well-developed, well-nourished, Caucasian female. HENT: AT. [place]. MMM.  Eyes:Pupils Equal Round Reactive to light, Extraocular movements intact,Conjunctiva without redness, discharge or icterus.   Neck: Supple, no lymphadenopathy, full range of motion CV: RRR no murmur appreciated Chest: CTAB, no wheeze or crackles Abd: Soft. Flat. NTND. BS present. No Masses palpated.  Skin:  Right medial ankle: 6x5 cm erythemic ring with central clearing, no necrotic center Arm: 11x10 erythemic ring, mild  bruise like center Left belt line: 18x8 oval-shaped erythemic solid ring, with 3 x 2 erythemic lesion above  back: 3x2 erythemic solid lesion lower back with approximately 4 insect bites within healed, with approximately 5 healing insect bites Center back. Neuro: Normal gait. PERLA. EOMi. Alert. Oriented. Cranial nerves II through XII intact. Muscle strength 5/5 upper and lower extremity. DTRs equal bilaterally.    Assessment & Plan:  1. Erythema migrans (Lyme disease) - Rash and history consistent with erythema migrans. Patient without any neurological symptoms today. Discussed differential diagnosis and possible etiologies of erythema migrans with patient in detail today. Additional information was provided to patient from [facility] site. - CBC w/Diff - Sed Rate (ESR) - doxycycline (VIBRA-TABS) 100 MG tablet; Take 1 tablet (100 mg total) by mouth 2 (two) times daily.  Dispense: 42 tablet; Refill: 0 - hydrOXYzine (VISTARIL) 25 MG capsule; Take 1 capsule (25 mg total) by mouth 3 (three) times daily as needed.  Dispense: 30 capsule; Refill: 0 - Follow-up in 2 weeks, sooner if any additional symptoms/red flags present.

## 2015-07-15 NOTE — Patient Instructions (Signed)
Return in 2 weeks to be re-evaluated.  Review sheet/info I printed for you.

## 2015-07-16 NOTE — Telephone Encounter (Signed)
Patient needs to take his antibiotics. Will inform her she should try to have more food on her stomach, but attempt to continue medication.

## 2015-07-16 NOTE — Telephone Encounter (Signed)
Patient aware to try more food in her stomach prior to taking antibiotics.

## 2015-07-16 NOTE — Telephone Encounter (Signed)
Patient aware of results.    Patient states she got really sick on antibiotics.  Pt did vomit after taking antibiotic ( about an hour after ingesting med )  Please advise if you want her to do something different?

## 2015-07-21 NOTE — Telephone Encounter (Signed)
Can you please advise?

## 2015-07-21 NOTE — Telephone Encounter (Signed)
Pt called and stated that seating does not help her nausea. She would like another kind of meds.

## 2015-07-23 ENCOUNTER — Ambulatory Visit (INDEPENDENT_AMBULATORY_CARE_PROVIDER_SITE_OTHER): Payer: BLUE CROSS/BLUE SHIELD | Admitting: Family Medicine

## 2015-07-23 ENCOUNTER — Encounter: Payer: Self-pay | Admitting: Family Medicine

## 2015-07-23 VITALS — BP 113/79 | HR 82 | Temp 98.8°F | Resp 18 | Wt 141.0 lb

## 2015-07-23 DIAGNOSIS — A6920 Lyme disease, unspecified: Secondary | ICD-10-CM | POA: Diagnosis not present

## 2015-07-23 NOTE — Patient Instructions (Signed)
Lyme Disease You may have been bitten by a tick and are to watch for the development of Lyme Disease. Lyme Disease is an infection that is caused by a bacteria The bacteria causing this disease is named Borreilia burgdorferi. If a tick is infected with this bacteria and then bites you, then Lyme Disease may occur. These ticks are carried by deer and rodents such as rabbits and mice and infest grassy as well as forested areas. Fortunately most tick bites do not cause Lyme Disease.  Lyme Disease is easier to prevent than to treat. First, covering your legs with clothing when walking in areas where ticks are possibly abundant will prevent their attachment because ticks tend to stay within inches of the ground. Second, using insecticides containing DEET can be applied on skin or clothing. Last, because it takes about 12 to 24 hours for the tick to transmit the disease after attachment to the human host, you should inspect your body for ticks twice a day when you are in areas where Lyme Disease is common. You must look thoroughly when searching for ticks. The Ixodes tick that carries Lyme Disease is very small. It is around the size of a sesame seed (picture of tick is not actual size). Removal is best done by grasping the tick by the head and pulling it out. Do not to squeeze the body of the tick. This could inject the infecting bacteria into the bite site. Wash the area of the bite with an antiseptic solution after removal.  Lyme Disease is a disease that may affect many body systems. Because of the small size of the biting tick, most people do not notice being bitten. The first sign of an infection is usually a round red rash that extends out from the center of the tick bite. The center of the lesion may be blood colored (hemorrhagic) or have tiny blisters (vesicular). Most lesions have bright red outer borders and partial central clearing. This rash may extend out many inches in diameter, and multiple lesions may  be present. Other symptoms such as fatigue, headaches, chills and fever, general achiness and swelling of lymph glands may also occur. If this first stage of the disease is left untreated, these symptoms may gradually resolve by themselves, or progressive symptoms may occur because of spread of infection to other areas of the body.  Follow up with your caregiver to have testing and treatment if you have a tick bite and you develop any of the above complaints. Your caregiver may recommend preventative (prophylactic) medications which kill bacteria (antibiotics). Once a diagnosis of Lyme Disease is made, antibiotic treatment is highly likely to cure the disease. Effective treatment of late stage Lyme Disease may require longer courses of antibiotic therapy.  MAKE SURE YOU:   Understand these instructions.  Will watch your condition.  Will get help right away if you are not doing well or get worse. Document Released: 02/06/2001 Document Revised: 01/23/2012 Document Reviewed: 04/10/2009 [facility] Patient Information 2015 [place], [place]. This information is not intended to replace advice given to you by your health care provider. Make sure you discuss any questions you have with your health care provider.  Continue antibiotic until completed.

## 2015-07-23 NOTE — Progress Notes (Signed)
   Subjective:    Patient ID: Suzanne Jarvis, female    DOB: May 31, 1991, 24 y.o.   MRN: [id number]  HPI  Erythema migrans: Patient presents for follow-up on erythema migrans. She reports she has had some nausea secondary to the doxycycline, but has been able to tolerate well enough. She does note some increased fatigue. She denies any fevers, chills, headaches or progression of rash. She states that her rash has almost completely resolved. She still has some itching, that she states the sister always working well for her. She and her boyfriend have moved out of the apartment he was living in which she was getting bit and was exposed to ticks.  Past Medical History  Diagnosis Date  . Tonsillitis   . Asthma   . IBS (irritable bowel syndrome)   . Chronic headaches    Allergies  Allergen Reactions  . Amoxicillin   . Penicillins     REACTION: hives   Social History   Social History  . Marital Status: Divorced    Spouse Name: N/A  . Number of Children: 0  . Years of Education: N/A   Occupational History  . cosmetologist    Social History Main Topics  . Smoking status: Current Every Day Smoker -- 0.25 packs/day for 6 years    Types: Cigarettes  . Smokeless tobacco: Not on file  . Alcohol Use: Yes     Comment: rare  . Drug Use: No  . Sexual Activity: Not on file   Other Topics Concern  . Not on file   Social History Narrative   Review of Systems ROS Negative, with the exception of above mentioned in HPI     Objective:   Physical Exam BP 113/79 mmHg  Pulse 82  Temp(Src) 98.8 F (37.1 C) (Oral)  Resp 18  Wt 141 lb (63.957 kg)  SpO2 99%  LMP 06/14/2015 Gen: Afebrile. No acute distress. Nontoxic in appearance, well-developed, well-nourished, Caucasian female. Skin: No rashes, purpura or petechiae. But bites are healed. No drainage, no erythema. Neuro: Normal gait. PERLA. EOMi. Alert. Orientedx3 Cranial nerves II through XII intact. Muscle strength 5/5 5/5 upper and  lower extremity. DTRs equal bilaterally.     Assessment & Plan:  1. Erythema migrans (Lyme disease) - Continue doxycycline until completion. - Rash has started to respond to medication. - AVS on Lyme disease given, red flags discussed with patient. - Follow-up as needed or if any symptoms return or any Lyme disease progression symptoms occur.

## 2015-08-11 ENCOUNTER — Telehealth: Payer: Self-pay | Admitting: Family Medicine

## 2015-08-11 NOTE — Telephone Encounter (Signed)
Patient left a VM stating she was sick during the night but feels a little better this morning. She states that she has finished her "lyme disease medication".

## 2015-08-11 NOTE — Telephone Encounter (Signed)
FYI

## 2015-08-14 ENCOUNTER — Ambulatory Visit (INDEPENDENT_AMBULATORY_CARE_PROVIDER_SITE_OTHER): Payer: BLUE CROSS/BLUE SHIELD

## 2015-08-14 DIAGNOSIS — Z23 Encounter for immunization: Secondary | ICD-10-CM | POA: Diagnosis not present

## 2015-09-30 ENCOUNTER — Ambulatory Visit: Payer: Self-pay | Admitting: Family Medicine

## 2015-09-30 ENCOUNTER — Encounter: Payer: Self-pay | Admitting: Family Medicine

## 2015-09-30 ENCOUNTER — Ambulatory Visit: Payer: BLUE CROSS/BLUE SHIELD | Admitting: Family Medicine

## 2015-09-30 ENCOUNTER — Ambulatory Visit (INDEPENDENT_AMBULATORY_CARE_PROVIDER_SITE_OTHER): Payer: BLUE CROSS/BLUE SHIELD | Admitting: Family Medicine

## 2015-09-30 VITALS — BP 119/78 | HR 88 | Temp 98.2°F | Resp 20 | Wt 141.0 lb

## 2015-09-30 DIAGNOSIS — E538 Deficiency of other specified B group vitamins: Secondary | ICD-10-CM

## 2015-09-30 DIAGNOSIS — K591 Functional diarrhea: Secondary | ICD-10-CM

## 2015-09-30 DIAGNOSIS — K589 Irritable bowel syndrome without diarrhea: Secondary | ICD-10-CM

## 2015-09-30 DIAGNOSIS — R197 Diarrhea, unspecified: Secondary | ICD-10-CM | POA: Insufficient documentation

## 2015-09-30 LAB — CBC WITH DIFFERENTIAL/PLATELET
BASOS ABS: 0 10*3/uL (ref 0.0–0.1)
Basophils Relative: 0 % (ref 0–1)
EOS ABS: 0.1 10*3/uL (ref 0.0–0.7)
Eosinophils Relative: 1 % (ref 0–5)
HCT: 41.2 % (ref 36.0–46.0)
HEMOGLOBIN: 14.8 g/dL (ref 12.0–15.0)
LYMPHS ABS: 1.8 10*3/uL (ref 0.7–4.0)
Lymphocytes Relative: 19 % (ref 12–46)
MCH: 32.5 pg (ref 26.0–34.0)
MCHC: 35.9 g/dL (ref 30.0–36.0)
MCV: 90.4 fL (ref 78.0–100.0)
MPV: 9.8 fL (ref 8.6–12.4)
Monocytes Absolute: 0.5 10*3/uL (ref 0.1–1.0)
Monocytes Relative: 5 % (ref 3–12)
NEUTROS ABS: 7.1 10*3/uL (ref 1.7–7.7)
NEUTROS PCT: 75 % (ref 43–77)
PLATELETS: 211 10*3/uL (ref 150–400)
RBC: 4.56 MIL/uL (ref 3.87–5.11)
RDW: 12.8 % (ref 11.5–15.5)
WBC: 9.4 10*3/uL (ref 4.0–10.5)

## 2015-09-30 LAB — COMPREHENSIVE METABOLIC PANEL
ALBUMIN: 4.4 g/dL (ref 3.6–5.1)
ALT: 12 U/L (ref 6–29)
AST: 17 U/L (ref 10–30)
Alkaline Phosphatase: 50 U/L (ref 33–115)
BILIRUBIN TOTAL: 0.7 mg/dL (ref 0.2–1.2)
BUN: 7 mg/dL (ref 7–25)
CALCIUM: 9.6 mg/dL (ref 8.6–10.2)
CHLORIDE: 99 mmol/L (ref 98–110)
CO2: 23 mmol/L (ref 20–31)
Creat: 0.72 mg/dL (ref 0.50–1.10)
Glucose, Bld: 76 mg/dL (ref 65–99)
POTASSIUM: 4.2 mmol/L (ref 3.5–5.3)
Sodium: 135 mmol/L (ref 135–146)
Total Protein: 7.4 g/dL (ref 6.1–8.1)

## 2015-09-30 LAB — VITAMIN B12: VITAMIN B 12: 566 pg/mL (ref 211–911)

## 2015-09-30 LAB — C-REACTIVE PROTEIN: CRP: 0.7 mg/dL — AB (ref <0.60)

## 2015-09-30 MED ORDER — DICYCLOMINE HCL 10 MG PO CAPS: 10.0000 mg | ORAL_CAPSULE | Freq: Three times a day (TID) | ORAL | Status: DC

## 2015-09-30 MED ORDER — DIPHENOXYLATE-ATROPINE 2.5-0.025 MG PO TABS: 1.0000 | ORAL_TABLET | Freq: Four times a day (QID) | ORAL | Status: DC | PRN

## 2015-09-30 MED ORDER — AMITRIPTYLINE HCL 25 MG PO TABS: 25.0000 mg | ORAL_TABLET | Freq: Every day | ORAL | Status: DC

## 2015-09-30 NOTE — Patient Instructions (Addendum)
Irritable Bowel Syndrome, Adult Irritable bowel syndrome (IBS) is not one specific disease. It is a group of symptoms that affects the organs responsible for digestion (gastrointestinal or GI tract).  To regulate how your GI tract works, your body sends signals back and forth between your intestines and your brain. If you have IBS, there may be a problem with these signals. As a result, your GI tract does not function normally. Your intestines may become more sensitive and overreact to certain things. This is especially true when you eat certain foods or when you are under stress.  There are four types of IBS. These may be determined based on the consistency of your stool:   IBS with diarrhea.   IBS with constipation.   Mixed IBS.   Unsubtyped IBS.  It is important to know which type of IBS you have. Some treatments are more likely to be helpful for certain types of IBS.  CAUSES  The exact cause of IBS is not known. RISK FACTORS You may have a higher risk of IBS if:  You are a woman.  You are younger than 24 years old.  You have a family history of IBS.  You have mental health problems.  You have had bacterial infection of your GI tract. SIGNS AND SYMPTOMS  Symptoms of IBS vary from person to person. The main symptom is abdominal pain or discomfort. Additional symptoms usually include one or more of the following:   Diarrhea, constipation, or both.   Abdominal swelling or bloating.   Feeling full or sick after eating a small or regular-size meal.   Frequent gas.   Mucus in the stool.   A feeling of having more stool left after a bowel movement.  Symptoms tend to come and go. They may be associated with stress, psychiatric conditions, or nothing at all.  DIAGNOSIS  There is no specific test to diagnose IBS. Your health care provider will make a diagnosis based on a physical exam, medical history, and your symptoms. You may have other tests to rule out other  conditions that may be causing your symptoms. These may include:   Blood tests.   X-rays.   CT scan.  Endoscopy and colonoscopy. This is a test in which your GI tract is viewed with a long, thin, flexible tube. TREATMENT There is no cure for IBS, but treatment can help relieve symptoms. IBS treatment often includes:   Changes to your diet, such as:  Eating more fiber.  Avoiding foods that cause symptoms.  Drinking more water.  Eating regular, medium-sized portioned meals.  Medicines. These may include:  Fiber supplements if you have constipation.  Medicine to control diarrhea (antidiarrheal medicines).  Medicine to help control muscle spasms in your GI tract (antispasmodic medicines).  Medicines to help with any mental health issues, such as antidepressants or tranquilizers.  Therapy.  Talk therapy may help with anxiety, depression, or other mental health issues that can make IBS symptoms worse.  Stress reduction.  Managing your stress can help keep symptoms under control. HOME CARE INSTRUCTIONS   Take medicines only as directed by your health care provider.  Eat a healthy diet.  Avoid foods and drinks with added sugar.  Include more whole grains, fruits, and vegetables gradually into your diet. This may be especially helpful if you have IBS with constipation.  Avoid any foods and drinks that make your symptoms worse. These may include dairy products and caffeinated or carbonated drinks.  Do not eat large meals.    Drink enough fluid to keep your urine clear or pale yellow.  Exercise regularly. Ask your health care provider for recommendations of good activities for you.  Keep all follow-up visits as directed by your health care provider. This is important. SEEK MEDICAL CARE IF:   You have constant pain.  You have trouble or pain with swallowing.  You have worsening diarrhea. SEEK IMMEDIATE MEDICAL CARE IF:   You have severe and worsening abdominal  pain.   You have diarrhea and:   You have a rash, stiff neck, or severe headache.   You are irritable, sleepy, or difficult to awaken.   You are weak, dizzy, or extremely thirsty.   You have bright red blood in your stool or you have black tarry stools.   You have unusual abdominal swelling that is painful.   You vomit continuously.   You vomit blood (hematemesis).   You have both abdominal pain and a fever.    This information is not intended to replace advice given to you by your health care provider. Make sure you discuss any questions you have with your health care provider.   Document Released: 10/31/2005 Document Revised: 11/21/2014 Document Reviewed: 07/18/2014 Elsevier Interactive Patient Education [employer]2016 [employer]  I want you to start the amitriptyline today of possible, after a 2-3 days, start the Lomotil for 2-3 days, and if pain/spasm feelings persist start bentyl last.  Follow up in 2 weeks.

## 2015-09-30 NOTE — Progress Notes (Addendum)
Subjective:    Patient ID: Suzanne Jarvis, female    DOB: 08-01-1991, 24 y.o.   MRN: [id number]  HPI   Irritable bowel syndrome: Patient presents for acute office visit today secondary to increase in irritable bowel syndrome symptoms. 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 She reports that at that time she was started on some medications in which she had an allergic reaction. Per electronic medical record review patient was started on a hyoscyamine in which she reacted with hives. She has also been tried on Imodium on 2 different occasions, which she states makes her IBS worse because it causes her constipation and then her spasms become worse. Patient states she was only taking the Imodium once a day. She was tested for celiac in January 2016 with a negative workup. Patient states she is not lactose intolerant and has tested in her diet. She denies any rash. She currently is no longer using the probiotic. Patient states recently within the last month her symptoms have become worse again. She states she has at least 8-10 sometimes more diarrhea stools in a day. She denies any melena, hematochezia or mucus in her bowel movements. She denies any foul smelling odor, fever or chills. Patient reports that she will be a stomach sharp pain/spasm and she will immediately have to go to the bathroom. If she does immediately go, there has been at least 2 times where she's had fecal incontinence. Patient does report mild relief at least for a few minutes after defecation. Patient denies abdominal pain at any other time, with the exception of just before she has to go to the bathroom. She reports eating can sometimes cause her stomach to spasm and have the bathroom. She does endorse waking up a few times in the middle of the night with spasm and need to go to the bathroom. Patient denies any night sweats or unintentional weight loss. Patient states  that she has a lot of anxiety surrounding her condition, but she also admits to anxiety on an everyday basis. She states that she still gets nervous about her compartment. 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 Patient has not been tried on anxiety medications in the past. She denies any dietary changes. She is a smoker. She had recently been on doxycycline for erythema migrans, however symptoms were present before antibiotic start. Fhx in cousin with celiac disease.  GAD 7 : Generalized Anxiety Score 09/30/2015  Nervous, Anxious, on Edge 2  Control/stop worrying 3  Worry too much - different things 2  Trouble relaxing 1  Restless 0  Easily annoyed or irritable 3  Afraid - awful might happen 2  Total GAD 7 Score 13  Anxiety Difficulty Very difficult      Past Medical History  Diagnosis Date  . Tonsillitis   . Asthma   . IBS (irritable bowel syndrome)   . Chronic headaches    Allergies  Allergen Reactions  . Amoxicillin   . Penicillins     REACTION: hives   Review of Systems Negative, with the exception of above mentioned in HPI     Objective:   Physical Exam BP 119/78 mmHg  Pulse 88  Temp(Src) 98.2 F (36.8 C) (Oral)  Resp 20  Wt 141 lb (63.957 kg)  SpO2 97% Gen: Afebrile. No acute distress. Nontoxic in  appearance, well-developed, well-nourished, Caucasian female. Very pleasant. Very anxious. HENT: AT. [place].  MMM. No oral lesions noted.  Eyes:Pupils Equal Round Reactive to light, Extraocular movements intact,  Conjunctiva without redness, discharge or icterus.y CV: RRR  Chest: CTAB, no wheeze or crackles Abd: Soft. Flat. NTND. BS present, normal. No Masses palpated. No hepatosplenomegaly Psych: Anxious, normal dress and demeanor. Normal speech. Normal thought content and judgment. GAD assessment: 13 IBS questionnaire: 22/30    Assessment & Plan:  Functional diarrhea/IBS/Vit  deficiency/anxiety - Unlikely infectious or malignancy considering repeat symptoms of prior IBS. However pt is now having night time symptoms on a few occassions. She did have an extended course of doxycyline for E. Migrans. However pt denies foul smelling stools, and again it is consistent with prior IBS flare. Patient had been tested prior for celiac with negative workup. Will obtain infectious and inflammatory labs today, as well as B12 and Vit D for possible deficiencies secondary to malabsorption. No weight loss, or hematochezia. No FHX of colon cancer or IBD, fhx of IBS present. Patient is does have moderate general anxiety by GAD assessment today. - Start amitriptyline low dose, lomotil and then add bentyl if still symptomatic in 1 week.  - B12 - Vitamin D (25 hydroxy) - Comp Met (CMET) - CBC w/Diff - C-reactive protein - Sed Rate (ESR) - amitriptyline (ELAVIL) 25 MG tablet; Take 1 tablet (25 mg total) by mouth at bedtime.  Dispense: 30 tablet; Refill: 0 - dicyclomine (BENTYL) 10 MG capsule; Take 1 capsule (10 mg total) by mouth 4 (four) times daily -  before meals and at bedtime.  Dispense: 120 capsule; Refill: 0 - diphenoxylate-atropine (LOMOTIL) 2.5-0.025 MG tablet; Take 1 tablet by mouth 4 (four) times daily as needed for diarrhea or loose stools.  Dispense: 120 tablet; Refill: - consider SSRI/SNRI therapy for anxiety.  - Consider rifaximin for 14 days  F/U 2 weeks

## 2015-10-01 ENCOUNTER — Telehealth: Payer: Self-pay | Admitting: Family Medicine

## 2015-10-01 LAB — VITAMIN D 25 HYDROXY (VIT D DEFICIENCY, FRACTURES): Vit D, 25-Hydroxy: 21 ng/mL — ABNORMAL LOW (ref 30–100)

## 2015-10-01 LAB — SEDIMENTATION RATE

## 2015-10-01 MED ORDER — VITAMIN D (ERGOCALCIFEROL) 1.25 MG (50000 UNIT) PO CAPS: 50000.0000 [IU] | ORAL_CAPSULE | ORAL | Status: DC

## 2015-10-01 NOTE — Telephone Encounter (Signed)
Please call patient concerning labs: - Her vitamin D is rather low at 21. I have called in supplementation for her to take 1 pill weekly for 12 weeks. I will need to retest this level after she completes supplementation. - She has a mild elevation in one of her inflammatory markers. We will discuss this in more detail at her 2 week follow-up.

## 2015-10-01 NOTE — Telephone Encounter (Signed)
reviewed lab work and instructions with patient. Patient verbalized understanding.

## 2015-10-01 NOTE — Telephone Encounter (Signed)
Left message for patient to return call to review lab results and instructions. 

## 2015-10-14 ENCOUNTER — Encounter: Payer: Self-pay | Admitting: Family Medicine

## 2015-10-14 ENCOUNTER — Ambulatory Visit (INDEPENDENT_AMBULATORY_CARE_PROVIDER_SITE_OTHER): Payer: BLUE CROSS/BLUE SHIELD | Admitting: Family Medicine

## 2015-10-14 VITALS — BP 108/72 | HR 69 | Temp 98.0°F | Resp 16 | Ht 65.0 in | Wt 145.0 lb

## 2015-10-14 DIAGNOSIS — K589 Irritable bowel syndrome without diarrhea: Secondary | ICD-10-CM

## 2015-10-14 MED ORDER — AMITRIPTYLINE HCL 50 MG PO TABS: 50.0000 mg | ORAL_TABLET | Freq: Every day | ORAL | Status: DC

## 2015-10-14 NOTE — Patient Instructions (Signed)
1 month call in and if everything good, then I will call in refills and followup in 3 months.. If still anxious, will need appt to start another med and then followup in 1 month. Consider backing down on lomotil if able.

## 2015-10-14 NOTE — Progress Notes (Signed)
Pre visit review using our clinic review tool, if applicable. No additional management support is needed unless otherwise documented below in the visit note. 

## 2015-10-14 NOTE — Progress Notes (Signed)
   Subjective:    Patient ID: Suzanne Jarvis, female    DOB: August 02, 1991, 24 y.o.   MRN: [id number]  HPI  Anxiety/IBS: Patient presents for follow-up on her anxiety and IBS. She was started on amitriptyline 25 mg daily at bedtime Bentyl 10 mg 4 times a day and Lomotil 4 times a day when necessary at her last visit approximately 4 weeks ago. She reports feeling "normal " again. She states she's very excited that her stomach is feeling much improved. She is not longer having stomach pain or spasms. She is having 2-3 normal stools a day. She states she is able to work without anxiety surrounding her bowel functions. Her sleeping has improved. She does admit to continued anxiety surrounding other areas of her life.   Past Medical History  Diagnosis Date  . Tonsillitis   . Asthma   . IBS (irritable bowel syndrome)   . Chronic headaches    Allergies  Allergen Reactions  . Amoxicillin   . Penicillins     REACTION: hives    Review of Systems Negative, with the exception of above mentioned in HPI     Objective:   Physical Exam BP 108/72 mmHg  Pulse 69  Temp(Src) 98 F (36.7 C) (Temporal)  Resp 16  Ht 5\' 5"  (1.651 m)  Wt 145 lb (65.772 kg)  BMI 24.13 kg/m2  SpO2 99% Gen: Afebrile. No acute distress. Nontoxic in appearance, well-developed, well-nourished, Caucasian female, appears well. HENT: AT. [place].  MMM.  Eyes:Pupils Equal Round Reactive to light, Extraocular movements intact,  Conjunctiva without redness, discharge or icterus. Abd: Soft. Flat. NTND. BS present/normal. No Masses palpated.  Psych: Normal affect, dress and demeanor. Normal speech. Normal thought content and judgment..     Assessment & Plan:  Suzanne Jarvis is a 24 y.o. female presents for follow-up for irritable bowel syndrome and anxiety. IBS (irritable bowel syndrome)/anxiety - Patient's irritable bowel symptoms are greatly improved. Anxiety and is still an issue for her. In detail with patient today starting  an additional medication for anxiety versus increasing her amitriptyline dose. She decided that we we'll try the amitriptyline 50 mg daily at bedtime. She is to call in in 3 weeks, if her anxiety is better controlled with the addition of the increased dose I will follow-up with her in 3 months. If she still having anxiety, we will need to see her again and start an additional medication for her anxiety. - Continue Bentyl and Lomotil when necessary only - amitriptyline (ELAVIL) 50 MG tablet; Take 1 tablet (50 mg total) by mouth at bedtime.  Dispense: 30 tablet; Refill: 0 Follow-up 3 weeks

## 2015-11-11 ENCOUNTER — Ambulatory Visit (INDEPENDENT_AMBULATORY_CARE_PROVIDER_SITE_OTHER): Payer: BLUE CROSS/BLUE SHIELD | Admitting: Family Medicine

## 2015-11-11 ENCOUNTER — Encounter: Payer: Self-pay | Admitting: Family Medicine

## 2015-11-11 VITALS — BP 119/78 | HR 80 | Temp 98.1°F | Resp 20 | Wt 145.5 lb

## 2015-11-11 DIAGNOSIS — K589 Irritable bowel syndrome without diarrhea: Secondary | ICD-10-CM

## 2015-11-11 DIAGNOSIS — F410 Panic disorder [episodic paroxysmal anxiety] without agoraphobia: Secondary | ICD-10-CM | POA: Diagnosis not present

## 2015-11-11 MED ORDER — AMITRIPTYLINE HCL 50 MG PO TABS: 50.0000 mg | ORAL_TABLET | Freq: Every day | ORAL | Status: DC

## 2015-11-11 MED ORDER — DICYCLOMINE HCL 10 MG PO CAPS: 10.0000 mg | ORAL_CAPSULE | Freq: Three times a day (TID) | ORAL | Status: DC

## 2015-11-11 MED ORDER — DIPHENOXYLATE-ATROPINE 2.5-0.025 MG PO TABS: 1.0000 | ORAL_TABLET | Freq: Four times a day (QID) | ORAL | Status: DC | PRN

## 2015-11-11 MED ORDER — PAROXETINE HCL 20 MG PO TABS: 20.0000 mg | ORAL_TABLET | Freq: Every day | ORAL | Status: DC

## 2015-11-11 NOTE — Patient Instructions (Signed)
I have called in paxil 20 mg to take in the morning.  Call in 3 weeks, if dose is ok will refill for you and you follow up in February. If you feel you need more of a dose with increase at  That time and you will still follow up in feb.

## 2015-11-11 NOTE — Progress Notes (Signed)
Subjective:    Patient ID: Suzanne Jarvis, female    DOB: Mar 24, 1991, 24 y.o.   MRN: [id number]  HPI  Anxiety: Patient presents for follow-up on anxiety today. She has been being treated for irritable bowel syndrome with 50 mg amitriptyline daily at bedtime, Lomotil, Bentyl and probiotics with good result. She states that her anxiety is still an issue. Her general anxiety disorder assessment was 16, with extreme difficulty into her life. Patient states that only improved with the amitriptyline, however she still does not feel it is controlled. She is having at least 1 panic attack weekly in which she feels like she can't breathe, her heart speeds up and she becomes extremely anxious and panicked. She states she attempts to "stay busy "doing anything she can such as cleaning the house in order to try to cope with these feelings. The panic attacks can last approximately 30 minutes sometimes. She feels she is under increased stress with work and her boyfriend. She just feels safe in her relationships. Her last visit we discussed if the anxiety wasn't better controlled with increased dose of amitriptyline to start another medication, and she feels like she does need to start another medication.  Past Medical History  Diagnosis Date  . Tonsillitis   . Asthma   . IBS (irritable bowel syndrome)   . Chronic headaches    Allergies  Allergen Reactions  . Amoxicillin   . Penicillins     REACTION: hives   Social History   Social History  . Marital Status: Divorced    Spouse Name: N/A  . Number of Children: 0  . Years of Education: N/A   Occupational History  . cosmetologist    Social History Main Topics  . Smoking status: Current Every Day Smoker -- 0.25 packs/day for 6 years    Types: Cigarettes  . Smokeless tobacco: Not on file  . Alcohol Use: Yes     Comment: rare  . Drug Use: No  . Sexual Activity: Not on file   Other Topics Concern  . Not on file   Social History Narrative     Review of Systems Negative, with the exception of above mentioned in HPI     Objective:   Physical Exam BP 119/78 mmHg  Pulse 80  Temp(Src) 98.1 F (36.7 C) (Oral)  Resp 20  Wt 145 lb 8 oz (65.998 kg)  SpO2 99%  LMP 11/08/2015 Gen: Afebrile. No acute distress. Nontoxic in appearance, well-developed, well-nourished, Caucasian female, very pleasant. HENT: AT. [place].MMM.  Eyes:Pupils Equal Round Reactive to light, Extraocular movements intact,  Conjunctiva without redness, discharge or icterus. CV: RRR Abd: Soft. Flat. NTND. BS present. No Masses palpated.  Skin: No rashes, purpura or petechiae.  Psych: Normal affect, dress and demeanor. Mildly Anxious. Normal speech. Normal thought content and judgment..     Assessment & Plan:  1. Severe anxiety with panic - Patient still having at least 1 panic attack week. Discussed possible options and we decided to start Paxil 20 mg in the morning. Patient is to call in 3 weeks, if she is doing fine on this dose will refill at 20 mg and see within a month after refill. If she needs a higher dose will call in a 40 mg dose and still will need to see her in a month. - PARoxetine (PAXIL) 20 MG tablet; Take 1 tablet (20 mg total) by mouth daily.  Dispense: 30 tablet; Refill: 0  2. IBS (irritable bowel syndrome) -  Improved. As patient's anxiety becomes more controlled to wean off of Lomotil. She states currently she has been unable to do so. - amitriptyline (ELAVIL) 50 MG tablet; Take 1 tablet (50 mg total) by mouth at bedtime.  Dispense: 30 tablet; Refill: 5 - dicyclomine (BENTYL) 10 MG capsule; Take 1 capsule (10 mg total) by mouth 4 (four) times daily -  before meals and at bedtime.  Dispense: 120 capsule; Refill: 3 - diphenoxylate-atropine (LOMOTIL) 2.5-0.025 MG tablet; Take 1 tablet by mouth 4 (four) times daily as needed for diarrhea or loose stools.  Dispense: 120 tablet; Refill: 3  Two-month follow-up on anxiety > 25 minutes spent with  patient, >50% of time spent face to face counseling patient and coordinating care.

## 2015-11-30 ENCOUNTER — Telehealth: Payer: Self-pay | Admitting: *Deleted

## 2015-11-30 NOTE — Telephone Encounter (Signed)
Pt can wither come in for OV to discuss options, or we can try zoloft in place of paxil  and follow in 3 weeks. Please advise.

## 2015-11-30 NOTE — Telephone Encounter (Signed)
Patient states she is having side effects from paxil. Dizziness, blurred vision and nausea. Patient states she has stopped taking this medication. Please advise

## 2015-12-01 NOTE — Telephone Encounter (Signed)
Left message for patient to return call.

## 2015-12-02 MED ORDER — SERTRALINE HCL 25 MG PO TABS: ORAL_TABLET | ORAL | Status: DC

## 2015-12-02 NOTE — Telephone Encounter (Signed)
Spoke with patient she would like to try the zoloft and then follow up. Explained to patient it would be 12/04/15 before RX is sent since Dr Claiborne Billings is out of office until then. Patient is ok with that. Patient understands she will need to make and appt 3 weeks after starting zoloft.

## 2015-12-02 NOTE — Telephone Encounter (Signed)
Left message for patient to return call.

## 2015-12-02 NOTE — Telephone Encounter (Signed)
zoloft prescribed, pt is take 25 mg for 1 week then increase dose to 50 mg daily.

## 2015-12-03 NOTE — Telephone Encounter (Signed)
Spoke with patient reviewed instructions for Zoloft use. Patient to schedule 3 week follow up. Patient verbalized understanding of all instructions.

## 2016-01-06 ENCOUNTER — Ambulatory Visit: Payer: Self-pay | Admitting: Family Medicine

## 2016-01-06 ENCOUNTER — Encounter: Payer: Self-pay | Admitting: Family Medicine

## 2016-01-06 ENCOUNTER — Ambulatory Visit (INDEPENDENT_AMBULATORY_CARE_PROVIDER_SITE_OTHER): Payer: Commercial Managed Care - HMO | Admitting: Family Medicine

## 2016-01-06 VITALS — BP 122/82 | HR 86 | Temp 98.6°F | Resp 20 | Wt 142.0 lb

## 2016-01-06 DIAGNOSIS — Z01419 Encounter for gynecological examination (general) (routine) without abnormal findings: Secondary | ICD-10-CM | POA: Diagnosis not present

## 2016-01-06 DIAGNOSIS — K589 Irritable bowel syndrome without diarrhea: Secondary | ICD-10-CM

## 2016-01-06 DIAGNOSIS — Z6824 Body mass index (BMI) 24.0-24.9, adult: Secondary | ICD-10-CM | POA: Diagnosis not present

## 2016-01-06 DIAGNOSIS — Z793 Long term (current) use of hormonal contraceptives: Secondary | ICD-10-CM | POA: Diagnosis not present

## 2016-01-06 DIAGNOSIS — F410 Panic disorder [episodic paroxysmal anxiety] without agoraphobia: Secondary | ICD-10-CM

## 2016-01-06 DIAGNOSIS — Z1389 Encounter for screening for other disorder: Secondary | ICD-10-CM | POA: Diagnosis not present

## 2016-01-06 DIAGNOSIS — Z113 Encounter for screening for infections with a predominantly sexual mode of transmission: Secondary | ICD-10-CM | POA: Diagnosis not present

## 2016-01-06 DIAGNOSIS — Z130 Encounter for screening for diseases of the blood and blood-forming organs and certain disorders involving the immune mechanism: Secondary | ICD-10-CM | POA: Diagnosis not present

## 2016-01-06 MED ORDER — SERTRALINE HCL 25 MG PO TABS: ORAL_TABLET | ORAL | Status: DC

## 2016-01-06 NOTE — Progress Notes (Signed)
Patient ID: Suzanne Jarvis, female   DOB: 08-Apr-1991, 25 y.o.   MRN: [id number]   Subjective:    Patient ID: Suzanne Jarvis, female    DOB: Aug 20, 1991, 25 y.o.   MRN: [id number]  HPI  Anxiety/IBS: Patient presents for follow-up on anxiety and IBS today.She has been being treated for irritable bowel syndrome with 50 mg amitriptyline daily at bedtime, Lomotil, Bentyl and probiotics with good result. She states that her anxiety is much improved on 25 mg zoloft. She was unable to tolerate paxil secondary tp dizziness and nausea. She was unable to tolerate 50 mg zoloft, secondary to "blunted emotions." She has had no panic attacks since starting zoloft. She did recently move back home,after ending a relationship of 6 years. She feels she is doing ok though and is working on herself now.   Past Medical History  Diagnosis Date  . Tonsillitis   . Asthma   . IBS (irritable bowel syndrome)   . Chronic headaches    Allergies  Allergen Reactions  . Amoxicillin   . Penicillins     REACTION: hives   Social History   Social History  . Marital Status: Divorced    Spouse Name: N/A  . Number of Children: 0  . Years of Education: N/A   Occupational History  . cosmetologist    Social History Main Topics  . Smoking status: Current Every Day Smoker -- 0.25 packs/day for 6 years    Types: Cigarettes  . Smokeless tobacco: Not on file  . Alcohol Use: Yes     Comment: rare  . Drug Use: No  . Sexual Activity: Not on file   Other Topics Concern  . Not on file   Social History Narrative    Review of Systems Negative, with the exception of above mentioned in HPI     Objective:   Physical Exam BP 122/82 mmHg  Pulse 86  Temp(Src) 98.6 F (37 C)  Resp 20  Wt 142 lb (64.411 kg)  SpO2 97% Gen: Afebrile. No acute distress. Nontoxic in appearance, well-developed, well-nourished, Caucasian female, very pleasant. HENT: AT. [place].MMM.  Eyes:Pupils Equal Round Reactive to light, Extraocular  movements intact,  Conjunctiva without redness, discharge or icterus. Abd: Soft. Flat. NTND. BS present. No Masses palpated.  Psych: Normal affect, dress and demeanor. Mildly Anxious. Normal speech. Normal thought content and judgment..     Assessment & Plan:  1. Severe anxiety with panic - greatly improved on zoloft 25 mg QD. No panic attacks since starting.  - refills provided today.  - F/U 6 months.  2. IBS (irritable bowel syndrome) - Improved. As patient's anxiety becomes more controlled to wean off of Lomotil.  - Continue amitriptyline (ELAVIL) - Continue dicyclomine (BENTYL) - Continue diphenoxylate-atropine (LOMOTIL) wean if able.   - 6 months f/u anxiety/ IBS, sooner if needed  Electronically Signed by: Felix Pacini, DO [facility] [place]

## 2016-01-06 NOTE — Patient Instructions (Signed)
It was nice to see you again.  Refills on medications called today, if you need refills before your apt just call.  F/U 6 months

## 2016-01-14 ENCOUNTER — Ambulatory Visit (INDEPENDENT_AMBULATORY_CARE_PROVIDER_SITE_OTHER): Payer: 59 | Admitting: Family Medicine

## 2016-01-14 ENCOUNTER — Encounter: Payer: Self-pay | Admitting: Family Medicine

## 2016-01-14 VITALS — BP 123/86 | HR 105 | Temp 99.2°F | Resp 20 | Wt 134.8 lb

## 2016-01-14 DIAGNOSIS — J111 Influenza due to unidentified influenza virus with other respiratory manifestations: Secondary | ICD-10-CM | POA: Diagnosis not present

## 2016-01-14 MED ORDER — OSELTAMIVIR PHOSPHATE 75 MG PO CAPS: 75.0000 mg | ORAL_CAPSULE | Freq: Two times a day (BID) | ORAL | Status: DC

## 2016-01-14 MED ORDER — HYDROCODONE-HOMATROPINE 5-1.5 MG/5ML PO SYRP: 5.0000 mL | ORAL_SOLUTION | Freq: Three times a day (TID) | ORAL | Status: DC | PRN

## 2016-01-14 NOTE — Progress Notes (Signed)
Patient ID: Suzanne Jarvis, female   DOB: 10-Sep-1991, 25 y.o.   MRN: [id number]    Suzanne Jarvis , [birth year], 25 y.o., female MRN: [id number]  CC: Cough  Subjective: Pt presents for an acute OV with complaints of cough of 2d  duration. Associated symptoms include myalgia, sore throat, headache, rhinorrhea, nasal congestion, fatigue, fever, chills, nausea.   Pt denies vomit or change in bowel habits. Pt has tried tylenol cold and flu to ease their symptoms. Pts mother tested positive for the flu and her grandparent was hospitalized with the flu.  Pt did have her flu shot this year.   Allergies  Allergen Reactions  . Amoxicillin   . Penicillins     REACTION: hives   Social History  Substance Use Topics  . Smoking status: Current Every Day Smoker -- 0.25 packs/day for 6 years    Types: Cigarettes  . Smokeless tobacco: Not on file  . Alcohol Use: Yes     Comment: rare   Past Medical History  Diagnosis Date  . Tonsillitis   . Asthma   . IBS (irritable bowel syndrome)   . Chronic headaches    History reviewed. No pertinent past surgical history. Family History  Problem Relation Age of Onset  . Allergies Mother   . Asthma Mother   . Hypertension Mother   . Lung cancer Other     paternal great grandfather  . Lung cancer Other     maternal great grandfather  . Cancer Other     Blood-great grandmother  . Arthritis Maternal Grandmother   . Irritable bowel syndrome Maternal Grandmother      Medication List       This list is accurate as of: 01/14/16 11:05 AM.  Always use your most recent med list.               amitriptyline 50 MG tablet  Commonly known as:  ELAVIL  Take 1 tablet (50 mg total) by mouth at bedtime.     dicyclomine 10 MG capsule  Commonly known as:  BENTYL  Take 1 capsule (10 mg total) by mouth 4 (four) times daily -  before meals and at bedtime.     diphenoxylate-atropine 2.5-0.025 MG tablet  Commonly known as:  LOMOTIL  Take 1 tablet by mouth 4  (four) times daily as needed for diarrhea or loose stools.     drospirenone-ethinyl estradiol 3-0.02 MG tablet  Commonly known as:  YAZ,GIANVI,LORYNA  Take 1 tablet by mouth daily.     loratadine 10 MG tablet  Commonly known as:  CLARITIN  Take 10 mg by mouth daily.     sertraline 25 MG tablet  Commonly known as:  ZOLOFT  25 mg Qd     Vitamin D (Ergocalciferol) 50000 units Caps capsule  Commonly known as:  DRISDOL  Take 1 capsule (50,000 Units total) by mouth every 7 (seven) days.       ROS: Negative, with the exception of above mentioned in HPI Objective:  BP 123/86 mmHg  Pulse 105  Temp(Src) 99.2 F (37.3 C)  Resp 20  Wt 134 lb 12 oz (61.122 kg)  SpO2 98%  LMP 01/08/2016 (Approximate) Body mass index is 22.42 kg/(m^2). Gen: Afebrile. No acute distress. Appears fatigued and ill. Nontoxic. Pleasant female. HENT: AT. [place]. Bilateral TM visualized, air fluid levels, no erythema or bulging.. MMM, no oral lesions. Bilateral nares mild erythema, drainage, no bogginess.  Throat with mild  Erythema, no exudates. Cough and  hoarseness present on exam.  Eyes:Pupils Equal Round Reactive to light, Extraocular movements intact,  Conjunctiva without redness, discharge or icterus. Neck/lymp/endocrine: Supple, No  Lymphadenopathy CV: mildly tachycardic Chest: CTAB, no wheeze or crackles. Good air movement, normal resp effort.  Abd: Soft. NTND. BS present Skin: no  rashes, purpura or petechiae.  Neuro: Normal gait. PERLA. EOMi. Alert. Oriented x3   Assessment/Plan: Suzanne Jarvis is a 25 y.o. female present for acute OV for  1. Influenza - Robitussin /Mucinex/hycodan. Push fluids. Advil/tylenol. Rest hydrate. - oseltamivir (TAMIFLU) 75 MG capsule; Take 1 capsule (75 mg total) by mouth 2 (two) times daily.  Dispense: 10 capsule; Refill: 0 - HYDROcodone-homatropine (HYCODAN) 5-1.5 MG/5ML syrup; Take 5 mLs by mouth every 8 (eight) hours as needed for cough.  Dispense: 120 mL; Refill:  0   electronically signed by:  Felix Pacini, DO  [facility] [place]

## 2016-01-14 NOTE — Patient Instructions (Signed)
Influenza, Adult Influenza ("the flu") is a viral infection of the respiratory tract. It occurs more often in winter months because people spend more time in close contact with one another. Influenza can make you feel very sick. Influenza easily spreads from person to person (contagious). CAUSES  Influenza is caused by a virus that infects the respiratory tract. You can catch the virus by breathing in droplets from an infected person's cough or sneeze. You can also catch the virus by touching something that was recently contaminated with the virus and then touching your mouth, nose, or eyes. RISKS AND COMPLICATIONS You may be at risk for a more severe case of influenza if you smoke cigarettes, have diabetes, have chronic heart disease (such as heart failure) or lung disease (such as asthma), or if you have a weakened immune system. Elderly people and pregnant women are also at risk for more serious infections. The most common problem of influenza is a lung infection (pneumonia). Sometimes, this problem can require emergency medical care and may be life threatening. SIGNS AND SYMPTOMS  Symptoms typically last 4 to 10 days and may include:  Fever.  Chills.  Headache, body aches, and muscle aches.  Sore throat.  Chest discomfort and cough.  Poor appetite.  Weakness or feeling tired.  Dizziness.  Nausea or vomiting. DIAGNOSIS  Diagnosis of influenza is often made based on your history and a physical exam. A nose or throat swab test can be done to confirm the diagnosis. TREATMENT  In mild cases, influenza goes away on its own. Treatment is directed at relieving symptoms. For more severe cases, your health care provider may prescribe antiviral medicines to shorten the sickness. Antibiotic medicines are not effective because the infection is caused by a virus, not by bacteria. HOME CARE INSTRUCTIONS  Take medicines only as directed by your health care provider.  Use a cool mist humidifier  to make breathing easier.  Get plenty of rest until your temperature returns to normal. This usually takes 3 to 4 days.  Drink enough fluid to keep your urine clear or pale yellow.  Cover yourmouth and nosewhen coughing or sneezing,and wash your handswellto prevent thevirusfrom spreading.  Stay homefromwork orschool untilthe fever is gonefor at least 98full day. PREVENTION  An annual influenza vaccination (flu shot) is the best way to avoid getting influenza. An annual flu shot is now routinely recommended for all adults in the [place] SEEK MEDICAL CARE IF:  You experiencechest pain, yourcough worsens,or you producemore mucus.  Youhave nausea,vomiting, ordiarrhea.  Your fever returns or gets worse. SEEK IMMEDIATE MEDICAL CARE IF:  You havetrouble breathing, you become short of breath,or your skin ornails becomebluish.  You have severe painor stiffnessin the neck.  You develop a sudden headache, or pain in the face or ear.  You have nausea or vomiting that you cannot control. MAKE SURE YOU:   Understand these instructions.  Will watch your condition.  Will get help right away if you are not doing well or get worse.   This information is not intended to replace advice given to you by your health care provider. Make sure you discuss any questions you have with your health care provider.   Document Released: 10/28/2000 Document Revised: 11/21/2014 Document Reviewed: 01/30/2012 Elsevier Interactive Patient Education 2016 [employer]   Robitussin /Mucinex/hycodan--> for cough, try to use hycodan at night only.  Push fluids.  Advil/tylenol for headache, muscle aches and fever tamiflu called in

## 2016-03-14 ENCOUNTER — Telehealth: Payer: Self-pay | Admitting: *Deleted

## 2016-03-14 NOTE — Telephone Encounter (Signed)
Patient called requesting appt for OV with Dr Claiborne BillingsKuneff for 03/16/16 states she is having pain with movement that shoots from back to chest states she feels like she pulled a muscle. Patient denies any SHOB, nausea or vomiting states pain is only with movement. Patient offered an appt for today but declined stating she wanted a Wednesday appt. Patient schedule for acute visit with Dr Milinda CaveMcgowen on requested day. Advised patient if symptoms get worse or she develops any SHOB,nausea or vomiting report to the Er. Patient verbalized understanding.

## 2016-03-16 ENCOUNTER — Ambulatory Visit: Payer: 59 | Admitting: Family Medicine

## 2016-03-16 DIAGNOSIS — Z0289 Encounter for other administrative examinations: Secondary | ICD-10-CM

## 2016-06-09 ENCOUNTER — Other Ambulatory Visit: Payer: Self-pay | Admitting: Family Medicine

## 2016-06-09 DIAGNOSIS — K589 Irritable bowel syndrome without diarrhea: Secondary | ICD-10-CM

## 2016-06-30 ENCOUNTER — Other Ambulatory Visit: Payer: Self-pay | Admitting: Family Medicine

## 2016-06-30 DIAGNOSIS — K589 Irritable bowel syndrome without diarrhea: Secondary | ICD-10-CM

## 2016-08-24 ENCOUNTER — Encounter: Payer: Self-pay | Admitting: Family Medicine

## 2016-08-24 ENCOUNTER — Ambulatory Visit (INDEPENDENT_AMBULATORY_CARE_PROVIDER_SITE_OTHER): Payer: 59 | Admitting: Family Medicine

## 2016-08-24 VITALS — BP 125/85 | HR 80 | Temp 98.6°F | Resp 20 | Ht 65.0 in | Wt 132.8 lb

## 2016-08-24 DIAGNOSIS — Z23 Encounter for immunization: Secondary | ICD-10-CM

## 2016-08-24 DIAGNOSIS — K580 Irritable bowel syndrome with diarrhea: Secondary | ICD-10-CM | POA: Diagnosis not present

## 2016-08-24 MED ORDER — DICYCLOMINE HCL 10 MG PO CAPS: ORAL_CAPSULE | ORAL | 1 refills | Status: DC

## 2016-08-24 MED ORDER — DIPHENOXYLATE-ATROPINE 2.5-0.025 MG PO TABS: ORAL_TABLET | ORAL | 1 refills | Status: DC

## 2016-08-24 NOTE — Patient Instructions (Signed)
I am glad you are doing well.  Have fun on your trip.  Follow up yearly with physical.

## 2016-08-24 NOTE — Progress Notes (Signed)
Patient ID: Suzanne Jarvis, female   DOB: August 12, 1991, 25 y.o.   MRN: [id number]   Subjective:    Patient ID: Suzanne Jarvis, female    DOB: August 12, 1991, 25 y.o.   MRN: [id number]  HPI  Anxiety/IBS: Patient presents for follow-up on anxiety and IBS today.She states she is doing really well. She is not taking the amitriptyline or Zoloft. She states she had not felt like she needed it after she ended her relationship with her prior boyfriend. The ex-boyfriend did OD on heroin this summer. She reports she is doing ok. She is in a good relationship with someone else and her family was supportive. She is still in contact with the ex's family, which has also been helpful. She continues to take the bentyl and lomotil as needed. She usually needs it 1-2x a day each.    - pt agreeable to flu shot today   Past Medical History:  Diagnosis Date  . Asthma   . Chronic headaches   . IBS (irritable bowel syndrome)   . Tonsillitis    Allergies  Allergen Reactions  . Amoxicillin   . Penicillins     REACTION: hives   Social History   Social History  . Marital status: Divorced    Spouse name: N/A  . Number of children: 0  . Years of education: N/A   Occupational History  . cosmetologist    Social History Main Topics  . Smoking status: Current Every Day Smoker    Packs/day: 0.25    Years: 6.00    Types: Cigarettes  . Smokeless tobacco: Never Used  . Alcohol use Yes     Comment: rare  . Drug use: No  . Sexual activity: Not on file   Other Topics Concern  . Not on file   Social History Narrative  . No narrative on file    Review of Systems Negative, with the exception of above mentioned in HPI     Objective:   Physical Exam BP 125/85 (BP Location: Left Arm, Patient Position: Sitting, Cuff Size: Normal)   Pulse 80   Temp 98.6 F (37 C) (Oral)   Resp 20   Ht 5\' 5"  (1.651 m)   Wt 132 lb 12.8 oz (60.2 kg)   SpO2 98%   BMI 22.10 kg/m  Gen: Afebrile. No acute distress.  Nontoxic in appearance, well-developed, well-nourished, Caucasian female, very pleasant. HENT: AT. [place].MMM.  Eyes:Pupils Equal Round Reactive to light, Extraocular movements intact,  Conjunctiva without redness, discharge or icterus. CV: RRR Abd: Soft. Flat. NTND. BS present. No Masses palpated.  Psych: Normal affect, dress and demeanor.  Normal speech. Normal thought content and judgment.     Assessment & Plan:  Severe anxiety with panic - Doing much better, not needing medications any longer.   IBS (irritable bowel syndrome) - Improved. Needing refills today.  - Continue dicyclomine (BENTYL) - Continue diphenoxylate-atropine (LOMOTIL) wean if able.  - F/U 1 year as long as doing ok./yearly physical.  - flu shot administered today.   Electronically Signed by: Felix Pacinienee Taniah Reinecke, DO [facility] [place]

## 2016-10-21 ENCOUNTER — Telehealth: Payer: Self-pay | Admitting: Family Medicine

## 2016-10-21 DIAGNOSIS — K580 Irritable bowel syndrome with diarrhea: Secondary | ICD-10-CM

## 2016-10-21 MED ORDER — DIPHENOXYLATE-ATROPINE 2.5-0.025 MG PO TABS: ORAL_TABLET | ORAL | 1 refills | Status: DC

## 2016-10-21 NOTE — Telephone Encounter (Signed)
Lomotil refilled °

## 2016-10-21 NOTE — Telephone Encounter (Signed)
Pt called back stating that this medication was recalled and she had to dispose of her current Rx. She stated that she needs this asap. She is leaving town in 2 hours. Please advise. Thanks.

## 2016-10-21 NOTE — Telephone Encounter (Signed)
[facility]. Patient going out of town in 3 hours & needs to take them with her.

## 2016-10-26 NOTE — Telephone Encounter (Signed)
Patient aware.

## 2016-11-21 ENCOUNTER — Ambulatory Visit (INDEPENDENT_AMBULATORY_CARE_PROVIDER_SITE_OTHER): Payer: 59 | Admitting: Family Medicine

## 2016-11-21 ENCOUNTER — Encounter: Payer: Self-pay | Admitting: Family Medicine

## 2016-11-21 VITALS — BP 114/76 | HR 88 | Temp 98.5°F | Resp 20 | Wt 141.5 lb

## 2016-11-21 DIAGNOSIS — J039 Acute tonsillitis, unspecified: Secondary | ICD-10-CM

## 2016-11-21 MED ORDER — METHYLPREDNISOLONE ACETATE 80 MG/ML IJ SUSP
80.0000 mg | Freq: Once | INTRAMUSCULAR | Status: AC
  Administered 2016-11-21: 80 mg via INTRAMUSCULAR

## 2016-11-21 MED ORDER — HYDROCODONE-HOMATROPINE 5-1.5 MG/5ML PO SYRP: 5.0000 mL | ORAL_SOLUTION | Freq: Three times a day (TID) | ORAL | 0 refills | Status: DC | PRN

## 2016-11-21 MED ORDER — DOXYCYCLINE HYCLATE 100 MG PO TABS: 100.0000 mg | ORAL_TABLET | Freq: Two times a day (BID) | ORAL | 0 refills | Status: DC

## 2016-11-21 NOTE — Addendum Note (Signed)
Addended by: Thomasena EdisWILLIAMS, Oksana Deberry N on: 11/21/2016 02:59 PM   Modules accepted: Orders

## 2016-11-21 NOTE — Progress Notes (Signed)
Suzanne Jarvis , 1991-02-20, 26 y.o., female MRN: [id number] Patient Care Team    Relationship Specialty Notifications Start End  Natalia Leatherwood, DO PCP - General Family Medicine  09/04/15     CC: cough  Subjective: Pt presents for an acute OV with complaints of cough  of 3 days duration.  Associated symptoms include cough is productive sometimes, hoarseness, runny nose. She denies fever, chills, nausea, vomit or diarrhea. UTD with flu shot. Pt has tried cough syrup  to ease their symptoms. She is allergic to PCN.  Allergies  Allergen Reactions  . Amoxicillin   . Paxil [Paroxetine Hcl]     Dizziness   . Penicillins     REACTION: hives   Social History  Substance Use Topics  . Smoking status: Former Smoker    Packs/day: 0.25    Years: 6.00    Types: Cigarettes  . Smokeless tobacco: Never Used  . Alcohol use Yes     Comment: rare   Past Medical History:  Diagnosis Date  . Asthma   . Chronic headaches   . IBS (irritable bowel syndrome)   . Tonsillitis    History reviewed. No pertinent surgical history. Family History  Problem Relation Age of Onset  . Allergies Mother   . Asthma Mother   . Hypertension Mother   . Arthritis Maternal Grandmother   . Irritable bowel syndrome Maternal Grandmother   . Lung cancer Other     paternal great grandfather  . Lung cancer Other     maternal great grandfather  . Cancer Other     Blood-great grandmother   Allergies as of 11/21/2016      Reactions   Amoxicillin    Paxil [paroxetine Hcl]    Dizziness   Penicillins    REACTION: hives      Medication List       Accurate as of 11/21/16  2:39 PM. Always use your most recent med list.          dicyclomine 10 MG capsule Commonly known as:  BENTYL TAKE 1 CAPSULE(10 MG) BY MOUTH FOUR TIMES DAILY PRN BEFORE MEALS AND AT BEDTIME   diphenoxylate-atropine 2.5-0.025 MG tablet Commonly known as:  LOMOTIL TAKE 1 TABLET BY MOUTH FOUR TIMES DAILY AS NEEDED FOR DIARRHEA OR LOOSE  STOOLS.   drospirenone-ethinyl estradiol 3-0.02 MG tablet Commonly known as:  YAZ,GIANVI,LORYNA Take 1 tablet by mouth daily.   loratadine 10 MG tablet Commonly known as:  CLARITIN Take 10 mg by mouth daily.   multivitamin with minerals tablet Take 1 tablet by mouth daily.       No results found for this or any previous visit (from the past 24 hour(s)). No results found.   ROS: Negative, with the exception of above mentioned in HPI   Objective:  BP 114/76 (BP Location: Left Arm, Patient Position: Sitting, Cuff Size: Normal)   Pulse 88   Temp 98.5 F (36.9 C)   Resp 20   Wt 141 lb 8 oz (64.2 kg)   SpO2 98%   BMI 23.55 kg/m  Body mass index is 23.55 kg/m. Gen: Afebrile. No acute distress. Nontoxic in appearance, well developed, well nourished.  HENT: AT. [place]. Bilateral TM visualized bilateral air fluid levels. MMM, no oral lesions. Bilateral nares with erythema and drainage. Throat without erythema or exudates. Tonsils enlarged. Cough present. Hoarseness present.  Eyes:Pupils Equal Round Reactive to light, Extraocular movements intact,  Conjunctiva without redness, discharge or icterus. Neck/lymp/endocrine: Supple,bilateral large  cervical  lymphadenopathy CV: RRR  Chest: CTAB, no wheeze or crackles. Good air movement, normal resp effort.  Abd: Soft. NTND. BS presnet  Assessment/Plan: Suzanne Jarvis is a 26 y.o. female present for acute OV for  Acute tonsillitis, unspecified etiology Doxycyline 100 mg BID x 10 days IM depo medrol Rest, hydrate. Hycodan, mucinex DM f/u PRN  electronically signed by:  Felix Pacinienee [name], DO  [facility] [place]

## 2016-11-21 NOTE — Patient Instructions (Addendum)
Doxycyline 100 mg BID x 10 days IM depo medrol Rest, hydrate. Mucinex DM Hycodan cough syrup at night only, no refills on med.

## 2017-01-12 ENCOUNTER — Other Ambulatory Visit: Payer: Self-pay | Admitting: Family Medicine

## 2017-01-12 DIAGNOSIS — K580 Irritable bowel syndrome with diarrhea: Secondary | ICD-10-CM

## 2017-01-18 ENCOUNTER — Encounter: Payer: Self-pay | Admitting: Family Medicine

## 2017-01-18 ENCOUNTER — Ambulatory Visit (INDEPENDENT_AMBULATORY_CARE_PROVIDER_SITE_OTHER): Payer: 59 | Admitting: Family Medicine

## 2017-01-18 DIAGNOSIS — K580 Irritable bowel syndrome with diarrhea: Secondary | ICD-10-CM | POA: Diagnosis not present

## 2017-01-18 DIAGNOSIS — Z113 Encounter for screening for infections with a predominantly sexual mode of transmission: Secondary | ICD-10-CM | POA: Diagnosis not present

## 2017-01-18 DIAGNOSIS — Z01419 Encounter for gynecological examination (general) (routine) without abnormal findings: Secondary | ICD-10-CM | POA: Diagnosis not present

## 2017-01-18 DIAGNOSIS — Z130 Encounter for screening for diseases of the blood and blood-forming organs and certain disorders involving the immune mechanism: Secondary | ICD-10-CM | POA: Diagnosis not present

## 2017-01-18 DIAGNOSIS — Z793 Long term (current) use of hormonal contraceptives: Secondary | ICD-10-CM | POA: Diagnosis not present

## 2017-01-18 DIAGNOSIS — Z6823 Body mass index (BMI) 23.0-23.9, adult: Secondary | ICD-10-CM | POA: Diagnosis not present

## 2017-01-18 DIAGNOSIS — Z1389 Encounter for screening for other disorder: Secondary | ICD-10-CM | POA: Diagnosis not present

## 2017-01-18 MED ORDER — DICYCLOMINE HCL 10 MG PO CAPS: 10.0000 mg | ORAL_CAPSULE | Freq: Three times a day (TID) | ORAL | 11 refills | Status: DC

## 2017-01-18 MED ORDER — DIPHENOXYLATE-ATROPINE 2.5-0.025 MG PO TABS: 1.0000 | ORAL_TABLET | Freq: Two times a day (BID) | ORAL | 3 refills | Status: DC | PRN

## 2017-01-18 NOTE — Patient Instructions (Signed)
Refills for 1 year on your medicines. We can refill at your meds at your yearly physical as long as your stomach is stable.   If condition worsens then I would need to see you. \

## 2017-01-18 NOTE — Progress Notes (Signed)
Patient ID: Suzanne Jarvis Gaskins, female   DOB: 03/09/1991, 26 y.o.   MRN: [id number]   Subjective:    Patient ID: Suzanne Jarvis Weiskopf, female    DOB: 03/09/1991, 26 y.o.   MRN: [id number]  HPI  Anxiety/IBS: Follow up in IBS. She is doing well. Continues to take bentyl about 2-3 times a day and lomotil about 1-2 times a day as needed. No side effects to report. Would like  refills today.   Prior note:  Patient presents for follow-up on anxiety and IBS today.She states she is doing really well. She is not taking the amitriptyline or Zoloft. She states she had not felt like she needed it after she ended her relationship with her prior boyfriend. The ex-boyfriend did OD on heroin this summer. She reports she is doing ok. She is in a good relationship with someone else and her family was supportive. She is still in contact with the ex's family, which has also been helpful. She continues to take the bentyl and lomotil as needed. She usually needs it 1-2x a day each.   Past Medical History:  Diagnosis Date  . Asthma   . Chronic headaches   . IBS (irritable bowel syndrome)   . Tonsillitis    Allergies  Allergen Reactions  . Amoxicillin   . Paxil [Paroxetine Hcl]     Dizziness   . Penicillins     REACTION: hives   Social History   Social History  . Marital status: Divorced    Spouse name: N/A  . Number of children: 0  . Years of education: N/A   Occupational History  . cosmetologist    Social History Main Topics  . Smoking status: Former Smoker    Packs/day: 0.25    Years: 6.00    Types: Cigarettes  . Smokeless tobacco: Never Used  . Alcohol use Yes     Comment: rare  . Drug use: No  . Sexual activity: Not on file   Other Topics Concern  . Not on file   Social History Narrative  . No narrative on file    Review of Systems Negative, with the exception of above mentioned in HPI     Objective:   Physical Exam BP 120/81 (BP Location: Right Arm, Patient Position: Sitting,  Cuff Size: Normal)   Pulse 86   Temp 98 F (36.7 C)   Resp 20   Ht 5\' 5"  (1.651 m)   Wt 142 lb (64.4 kg)   SpO2 98%   BMI 23.63 kg/m  Gen: Afebrile. No acute distress.  HENT: AT. [place].  MMM.  Eyes:Pupils Equal Round Reactive to light, Extraocular movements intact,  Conjunctiva without redness, discharge or icterus. CV: RRR  Chest: CTAB, no wheeze or crackles Abd: Soft. Flat. NTND. BS present (normal). no Masses palpated.  Neuro:  Normal gait. PERLA. EOMi. Alert. Oriented.  Psych: Normal affect, dress and demeanor. Normal speech. Normal thought content and judgment.      Assessment & Plan:  Suzanne Jarvis Hue is a 26 y.o. female present for IBS follow up.  IBS (irritable bowel syndrome) - stable today.  - Continue dicyclomine (BENTYL) - Continue diphenoxylate-atropine (LOMOTIL) wean if able.  - refills provided for a year.  - F/U 1 year as long as doing ok. At yearly physical. Sooner if worsening.    Electronically Signed by: Felix Pacinienee Dailey Buccheri, DO  [facility]- [place]

## 2017-03-01 DIAGNOSIS — Z113 Encounter for screening for infections with a predominantly sexual mode of transmission: Secondary | ICD-10-CM | POA: Diagnosis not present

## 2017-11-29 ENCOUNTER — Encounter: Payer: Self-pay | Admitting: Family Medicine

## 2017-11-29 ENCOUNTER — Ambulatory Visit: Payer: Self-pay | Admitting: Family Medicine

## 2017-11-29 ENCOUNTER — Ambulatory Visit: Payer: BLUE CROSS/BLUE SHIELD | Admitting: Family Medicine

## 2017-11-29 VITALS — BP 111/73 | HR 88 | Temp 98.6°F | Wt 135.1 lb

## 2017-11-29 DIAGNOSIS — J320 Chronic maxillary sinusitis: Secondary | ICD-10-CM | POA: Diagnosis not present

## 2017-11-29 MED ORDER — DOXYCYCLINE HYCLATE 100 MG PO TABS: 100.0000 mg | ORAL_TABLET | Freq: Two times a day (BID) | ORAL | 0 refills | Status: DC

## 2017-11-29 NOTE — Progress Notes (Signed)
Suzanne Jarvis , February 10, 1991, 27 y.o., female MRN: [id number] Patient Care Team    Relationship Specialty Notifications Start End  Natalia LeatherwoodKuneff, Raigan Baria A, DO PCP - General Family Medicine  09/04/15     Chief Complaint  Patient presents with  . URI    pts c/o productive cough with greenish sputum, congestion that started 4 days ago, try tynelol cold but no relief, pts deny sorethroat and fever..     Subjective: Pt presents for an OV with complaints of cough of 4-5 days duration.  Associated symptoms include green sputum, headache, fatigue, bloody nose, sinus pressure. She denies ongoing sore throat, but initially and sore throat. Pt has tried tylenol to ease their symptoms.   No flowsheet data found.  Allergies  Allergen Reactions  . Amoxicillin   . Paxil [Paroxetine Hcl]     Dizziness   . Penicillins     REACTION: hives   Social History   Tobacco Use  . Smoking status: Former Smoker    Packs/day: 0.25    Years: 6.00    Pack years: 1.50    Types: Cigarettes  . Smokeless tobacco: Never Used  Substance Use Topics  . Alcohol use: Yes    Comment: rare   Past Medical History:  Diagnosis Date  . Asthma   . Chronic headaches   . IBS (irritable bowel syndrome)   . Tonsillitis    History reviewed. No pertinent surgical history. Family History  Problem Relation Age of Onset  . Allergies Mother   . Asthma Mother   . Hypertension Mother   . Arthritis Maternal Grandmother   . Irritable bowel syndrome Maternal Grandmother   . Lung cancer Other        paternal great grandfather  . Lung cancer Other        maternal great grandfather  . Cancer Other        Blood-great grandmother   Allergies as of 11/29/2017      Reactions   Amoxicillin    Paxil [paroxetine Hcl]    Dizziness   Penicillins    REACTION: hives      Medication List        Accurate as of 11/29/17 10:40 AM. Always use your most recent med list.          dicyclomine 10 MG capsule Commonly known as:   BENTYL Take 1 capsule (10 mg total) by mouth 4 (four) times daily -  before meals and at bedtime.   diphenoxylate-atropine 2.5-0.025 MG tablet Commonly known as:  LOMOTIL Take 1 tablet by mouth 2 (two) times daily as needed for diarrhea or loose stools. TAKE 1 TABLET BY MOUTH FOUR TIMES DAILY AS NEEDED FOR DIARRHEA OR LOOSE STOOLS.   drospirenone-ethinyl estradiol 3-0.02 MG tablet Commonly known as:  YAZ,GIANVI,LORYNA Take 1 tablet by mouth daily.   multivitamin with minerals tablet Take 1 tablet by mouth daily.       All past medical history, surgical history, allergies, family history, immunizations andmedications were updated in the EMR today and reviewed under the history and medication portions of their EMR.     ROS: Negative, with the exception of above mentioned in HPI   Objective:  BP 111/73 (BP Location: Right Arm, Patient Position: Sitting, Cuff Size: Normal)   Pulse 88   Temp 98.6 F (37 C) (Oral)   Wt 135 lb 1.9 oz (61.3 kg)   LMP 11/04/2017   SpO2 99%   BMI 22.49 kg/m  Body mass  index is 22.49 kg/m. Gen: Afebrile. No acute distress. Nontoxic in appearance, well developed, well nourished.  HENT: AT. [place]. Bilateral TM visualized with fullnes L>R. MMM, no oral lesions. Bilateral nares with erythema, swelling and drainage L>R. Throat without erythema or exudates. PND, cough, TTP sinus.  Eyes:Pupils Equal Round Reactive to light, Extraocular movements intact,  Conjunctiva without redness, discharge or icterus. Neck/lymp/endocrine: Supple,no lymphadenopathy CV: RRR, Chest: CTAB, no wheeze or crackles. Good air movement, normal resp effort.  Abd: Soft. NTND. BS present Skin: no rashes, purpura or petechiae.   No exam data present No results found. No results found for this or any previous visit (from the past 24 hour(s)).  Assessment/Plan: Suzanne Jarvis is a 27 y.o. female present for OV for   Maxillary sinusitis, unspecified chronicity Rest, hydrate.  +  flonase, coricidin or deslym, nettie pot or nasal saline.  Start antibiotic (Doxy) called in --> on Friday if not improving or sooner if worsening or fever. If cough present it can last up to 6-8 weeks.  F/U 2 weeks of not improved.    Reviewed expectations re: course of current medical issues.  Discussed self-management of symptoms.  Outlined signs and symptoms indicating need for more acute intervention.  Patient verbalized understanding and all questions were answered.  Patient received an After-Visit Summary.    No orders of the defined types were placed in this encounter.    Note is dictated utilizing voice recognition software. Although note has been proof read prior to signing, occasional typographical errors still can be missed. If any questions arise, please do not hesitate to call for verification.   electronically signed by:  Felix Pacini, DO  [facility] [place]

## 2017-11-29 NOTE — Patient Instructions (Signed)
Rest, hydrate.  + flonase, coricidin or deslym, nettie pot or nasal saline.  Start antibiotic called in --> on Friday if not improving or sooner if worsening or fever. If cough present it can last up to 6-8 weeks.  F/U 2 weeks of not improved.    Sinusitis, Adult Sinusitis is soreness and inflammation of your sinuses. Sinuses are hollow spaces in the bones around your face. They are located:  Around your eyes.  In the middle of your forehead.  Behind your nose.  In your cheekbones.  Your sinuses and nasal passages are lined with a stringy fluid (mucus). Mucus normally drains out of your sinuses. When your nasal tissues get inflamed or swollen, the mucus can get trapped or blocked so air cannot flow through your sinuses. This lets bacteria, viruses, and funguses grow, and that leads to infection. Follow these instructions at home: Medicines  Take, use, or apply over-the-counter and prescription medicines only as told by your doctor. These may include nasal sprays.  If you were prescribed an antibiotic medicine, take it as told by your doctor. Do not stop taking the antibiotic even if you start to feel better. Hydrate and Humidify  Drink enough water to keep your pee (urine) clear or pale yellow.  Use a cool mist humidifier to keep the humidity level in your home above 50%.  Breathe in steam for 10-15 minutes, 3-4 times a day or as told by your doctor. You can do this in the bathroom while a hot shower is running.  Try not to spend time in cool or dry air. Rest  Rest as much as possible.  Sleep with your head raised (elevated).  Make sure to get enough sleep each night. General instructions  Put a warm, moist washcloth on your face 3-4 times a day or as told by your doctor. This will help with discomfort.  Wash your hands often with soap and water. If there is no soap and water, use hand sanitizer.  Do not smoke. Avoid being around people who are smoking (secondhand  smoke).  Keep all follow-up visits as told by your doctor. This is important. Contact a doctor if:  You have a fever.  Your symptoms get worse.  Your symptoms do not get better within 10 days. Get help right away if:  You have a very bad headache.  You cannot stop throwing up (vomiting).  You have pain or swelling around your face or eyes.  You have trouble seeing.  You feel confused.  Your neck is stiff.  You have trouble breathing. This information is not intended to replace advice given to you by your health care provider. Make sure you discuss any questions you have with your health care provider. Document Released: 04/18/2008 Document Revised: 06/26/2016 Document Reviewed: 08/26/2015 Elsevier Interactive Patient Education  [employer]2018 [employer]

## 2018-01-09 ENCOUNTER — Ambulatory Visit: Payer: BLUE CROSS/BLUE SHIELD | Admitting: Family Medicine

## 2018-01-09 ENCOUNTER — Encounter: Payer: Self-pay | Admitting: Family Medicine

## 2018-01-09 VITALS — BP 118/72 | HR 98 | Temp 98.2°F | Ht 65.0 in | Wt 135.2 lb

## 2018-01-09 DIAGNOSIS — R300 Dysuria: Secondary | ICD-10-CM

## 2018-01-09 DIAGNOSIS — N3001 Acute cystitis with hematuria: Secondary | ICD-10-CM | POA: Diagnosis not present

## 2018-01-09 LAB — POC URINALSYSI DIPSTICK (AUTOMATED)
Bilirubin, UA: NEGATIVE
GLUCOSE UA: NEGATIVE
Ketones, UA: NEGATIVE
Nitrite, UA: NEGATIVE
Protein, UA: NEGATIVE
SPEC GRAV UA: 1.01 (ref 1.010–1.025)
Urobilinogen, UA: 0.2 E.U./dL
pH, UA: 7.5 (ref 5.0–8.0)

## 2018-01-09 MED ORDER — SULFAMETHOXAZOLE-TRIMETHOPRIM 800-160 MG PO TABS: 1.0000 | ORAL_TABLET | Freq: Two times a day (BID) | ORAL | 0 refills | Status: DC

## 2018-01-09 NOTE — Progress Notes (Signed)
ZOX:WRUEAVPCP:[name], [name], DO Chief Complaint  Patient presents with  . Dysuria    Current Issues:  Presents with 2 days of dysuria, urinary urgency and urinary frequency Associated symptoms include:  dysuria and hematuria  There is no history of of similar symptoms.  Prior to Admission medications   Medication Sig Start Date End Date Taking? Authorizing Provider  dicyclomine (BENTYL) 10 MG capsule Take 1 capsule (10 mg total) by mouth 4 (four) times daily -  before meals and at bedtime. 01/18/17  Yes [name], [name], DO  diphenoxylate-atropine (LOMOTIL) 2.5-0.025 MG tablet Take 1 tablet by mouth 2 (two) times daily as needed for diarrhea or loose stools. TAKE 1 TABLET BY MOUTH FOUR TIMES DAILY AS NEEDED FOR DIARRHEA OR LOOSE STOOLS. 01/18/17  Yes [name], [name], DO  drospirenone-ethinyl estradiol (YAZ,GIANVI,LORYNA) 3-0.02 MG tablet Take 1 tablet by mouth daily.     Yes [provider]  Multiple Vitamins-Minerals (IMMUNE SUPPORT PO) Take 1 tablet by mouth daily.   Yes [provider]  Multiple Vitamins-Minerals (MULTIVITAMIN WITH MINERALS) tablet Take 1 tablet by mouth daily.   Yes [provider]    Review of Systems: General: Denies fever, chills, night sweats, changes in weight, changes in appetite HEENT: Denies headaches, ear pain, changes in vision, rhinorrhea, sore throat CV: Denies CP, palpitations, SOB, orthopnea Pulm: Denies SOB, cough, wheezing GI: Denies nausea, vomiting, diarrhea, constipation  +abdominal pain GU: Denies vaginal discharge  +dysuria, hematuria, frequency Msk: Denies muscle cramps, joint pains Neuro: Denies weakness, numbness, tingling Skin: Denies rashes, bruising Psych: Denies depression, anxiety, hallucinations   PE:  BP 118/72 (BP Location: Right Arm, Patient Position: Sitting, Cuff Size: Normal)   Pulse 98   Temp 98.2 F (36.8 C) (Oral)   Ht 5\' 5"  (1.651 m)   Wt 135 lb 3.2 oz (61.3 kg)   LMP 12/30/2017 (Approximate)   SpO2 97%    BMI 22.50 kg/m  Constitutional: Well-nourished, well-developed, in NAD Heart: RRR, no rubs, gallops, murmurs, no peripheral edema Lungs: CTA B, no accessory muscle use, no wheezing Back: No CVA tenderness Abdomen: Bowel sounds normal, nondistended, mild TTP of suprapubic area and left lower quadrant greater than right lower quadrant Pelvic: Deferred Skin: Warm, dry, intact  Results for orders placed or performed in visit on 01/09/18  POCT Urinalysis Dipstick (Automated)  Result Value Ref Range   Color, UA yellow    Clarity, UA clear    Glucose, UA n    Bilirubin, UA n    Ketones, UA n    Spec Grav, UA 1.010 1.010 - 1.025   Blood, UA 2+    pH, UA 7.5 5.0 - 8.0   Protein, UA n    Urobilinogen, UA 0.2 0.2 or 1.0 E.U./dL   Nitrite, UA n    Leukocytes, UA Moderate (2+) (Jarvis) Negative    Assessment and Plan:  1. Dysuria - POCT Urinalysis Dipstick (Automated) UA with moderate leuks, pH 7.5, SG 1.010, RBCs -We will send for UCx -We will start Bactrim twice daily times 7 days -Follow-up PRN  Abbe AmsterdamShannon Kemba Hoppes, MD

## 2018-01-09 NOTE — Patient Instructions (Signed)

## 2018-01-10 LAB — URINE CULTURE
MICRO NUMBER:: 90250295
RESULT: NO GROWTH
SPECIMEN QUALITY: ADEQUATE

## 2018-01-22 ENCOUNTER — Encounter: Payer: Self-pay | Admitting: Family Medicine

## 2018-01-22 LAB — HM PAP SMEAR

## 2018-02-12 ENCOUNTER — Other Ambulatory Visit: Payer: Self-pay | Admitting: Family Medicine

## 2018-02-21 ENCOUNTER — Ambulatory Visit: Payer: BLUE CROSS/BLUE SHIELD | Admitting: Family Medicine

## 2018-02-21 ENCOUNTER — Encounter: Payer: Self-pay | Admitting: Family Medicine

## 2018-02-21 DIAGNOSIS — K580 Irritable bowel syndrome with diarrhea: Secondary | ICD-10-CM | POA: Diagnosis not present

## 2018-02-21 MED ORDER — DICYCLOMINE HCL 10 MG PO CAPS: ORAL_CAPSULE | ORAL | 3 refills | Status: DC

## 2018-02-21 MED ORDER — DIPHENOXYLATE-ATROPINE 2.5-0.025 MG PO TABS: 1.0000 | ORAL_TABLET | Freq: Two times a day (BID) | ORAL | 3 refills | Status: DC | PRN

## 2018-02-21 NOTE — Patient Instructions (Signed)
It was great to see you today. I hope you have a great birthday.  Follow up in a year, sooner if needed.    Please help us help you:  We are honored [facility]. Below you will find basic instructions that you may need to access in the future. Please help us help you by reading the instructions, which cover many of the frequent questions we experience.   Prescription refills and request:  -In order to allow more efficient response time, please call your pharmacy for all refills. They will forward the request electronically to us. This allows for the quickest possible response. Request left on a nurse line can take longer to refill, since these are checked as time allows between office patients and other phone calls.  - refill request can take up to 3-5 working days to complete.  - If request is sent electronically and request is appropiate, it is usually completed in 1-2 business days.  - all patients will need to be seen routinely for all chronic medical conditions requiring prescription medications (see follow-up below). If you are overdue for follow up on your condition, you will be asked to make an appointment and we will call in enough medication to cover you until your appointment (up to 30 days).  - all controlled substances will require a face to face visit to request/refill.  - if you desire your prescriptions to go through a new pharmacy, and have an active script at original pharmacy, you will need to call your pharmacy and have scripts transferred to new pharmacy. This is completed between the pharmacy locations and not by your provider.    Results: If any images or labs were ordered, it can take up to 1 week to get results depending on the test ordered and the lab/facility running and resulting the test. - Normal or stable results, which do not need further discussion, may be released to your mychart immediately with attached note to you.  A call may not be generated for normal results. Please make certain to sign up for mychart. If you have questions on how to activate your mychart you can call the front office.  - If your results need further discussion, our office will attempt to contact you via phone, and if unable to reach you after 2 attempts, we will release your abnormal result to your mychart with instructions.  - All results will be automatically released in mychart after 1 week.  - Your provider will provide you with explanation and instruction on all relevant material in your results. Please keep in mind, results and labs may appear confusing or abnormal to the untrained eye, but it does not mean they are actually abnormal for you personally. If you have any questions about your results that are not covered, or you desire more detailed explanation than what was provided, you should make an appointment with your provider to do so.   Our office handles many outgoing and incoming calls daily. If we have not contacted you within 1 week about your results, please check your mychart to see if there is a message first and if not, then contact our office.  In helping with this matter, you help decrease call volume, and therefore allow us to be able to respond to patients needs more efficiently.   Acute office visits (sick visit):  An acute visit is intended for a new problem and are scheduled in shorter time  slots to allow schedule openings for patients with new problems. This is the appropriate visit to discuss a new problem. In order to provide you with excellent quality medical care with proper time for you to explain your problem, have an exam and receive treatment with instructions, these appointments should be limited to one new problem per visit. If you experience a new problem, in which you desire to be addressed, please make an acute office visit, we save openings on the schedule to accommodate you. Please do not save your new  problem for any other type of visit, let us take care of it properly and quickly for you.   Follow up visits:  Depending on your condition(s) your provider will need to see you routinely in order to provide you with quality care and prescribe medication(s). Most chronic conditions (Example: hypertension, Diabetes, depression/anxiety... etc), require visits a couple times a year. Your provider will instruct you on proper follow up for your personal medical conditions and history. Please make certain to make follow up appointments for your condition as instructed. Failing to do so could result in lapse in your medication treatment/refills. If you request a refill, and are overdue to be seen on a condition, we will always provide you with a 30 day script (once) to allow you time to schedule.    Medicare wellness (well visit): - we have a wonderful Nurse Selena Batten(Kim), that will meet with you and provide you will yearly medicare wellness visits. These visits should occur yearly (can not be scheduled less than 1 calendar year apart) and cover preventive health, immunizations, advance directives and screenings you are entitled to yearly through your medicare benefits. Do not miss out on your entitled benefits, this is when medicare will pay for these benefits to be ordered for you.  These are strongly encouraged by your provider and is the appropriate type of visit to make certain you are up to date with all preventive health benefits. If you have not had your medicare wellness exam in the last 12 months, please make certain to schedule one by calling the office and schedule your medicare wellness with Selena BattenKim as soon as possible.   Yearly physical (well visit):  - Adults are recommended to be seen yearly for physicals. Check with your insurance and date of your last physical, most insurances require one calendar year between physicals. Physicals include all preventive health topics, screenings, medical exam and labs that  are appropriate for gender/age and history. You may have fasting labs needed at this visit. This is a well visit (not a sick visit), new problems should not be covered during this visit (see acute visit).  - Pediatric patients are seen more frequently when they are younger. Your provider will advise you on well child visit timing that is appropriate for your their age. - This is not a medicare wellness visit. Medicare wellness exams do not have an exam portion to the visit. Some medicare companies allow for a physical, some do not allow a yearly physical. If your medicare allows a yearly physical you can schedule the medicare wellness with our nurse Selena BattenKim and have your physical with your provider after, on the same day. Please check with insurance for your full benefits.   Late Policy/No Shows:  - all new patients should arrive 15-30 minutes earlier than appointment to allow us time  to  obtain all personal demographics,  insurance information and for you to complete office paperwork. - All established patients should arrive  10-15 minutes earlier than appointment time to update all information and be checked in .  - In our best efforts to run on time, if you are late for your appointment you will be asked to either reschedule or if able, we will work you back into the schedule. There will be a wait time to work you back in the schedule,  depending on availability.  - If you are unable to make it to your appointment as scheduled, please call 24 hours ahead of time to allow [place] to fill the time slot with someone else who needs to be seen. If you do not cancel your appointment ahead of time, you may be charged a no show fee.

## 2018-02-21 NOTE — Progress Notes (Signed)
Patient ID: Suzanne Jarvis, female   DOB: [birth year], 27 y.o.   MRN: [id number]   Subjective:    Patient ID: Suzanne Jarvis, female    DOB: [birth year], 27 y.o.   MRN: [id number]  HPI  Anxiety/IBS: pt is doing very well on medication regimen. She is taking the lomotil mostly on long car rides, and with flares. Continues to take bentyl about 2-3 times a day. Weight stable.  Prior note:  Patient presents for follow-up on anxiety and IBS today.She states she is doing really well. She is not taking the amitriptyline or Zoloft. She states she had not felt like she needed it after she ended her relationship with her prior boyfriend. The ex-boyfriend did OD on heroin this summer. She reports she is doing ok. She is in a good relationship with someone else and her family was supportive. She is still in contact with the ex's family, which has also been helpful. She continues to take the bentyl and lomotil as needed. She usually needs it 1-2x a day each.   Past Medical History:  Diagnosis Date  . Asthma   . Chronic headaches   . IBS (irritable bowel syndrome)   . Tonsillitis    Allergies  Allergen Reactions  . Amoxicillin   . Paxil [Paroxetine Hcl]     Dizziness   . Penicillins     REACTION: hives   Social History   Socioeconomic History  . Marital status: Divorced    Spouse name: Not on file  . Number of children: 0  . Years of education: Not on file  . Highest education level: Not on file  Occupational History  . Occupation: Associate Professorcosmetologist  Social Needs  . Financial resource strain: Not on file  . Food insecurity:    Worry: Not on file    Inability: Not on file  . Transportation needs:    Medical: Not on file    Non-medical: Not on file  Tobacco Use  . Smoking status: Former Smoker    Packs/day: 0.25    Years: 6.00    Pack years: 1.50    Types: Cigarettes  . Smokeless tobacco: Never Used  Substance and Sexual Activity  . Alcohol use: Yes    Comment: rare  . Drug use: No   . Sexual activity: Not on file  Lifestyle  . Physical activity:    Days per week: Not on file    Minutes per session: Not on file  . Stress: Not on file  Relationships  . Social connections:    Talks on phone: Not on file    Gets together: Not on file    Attends religious service: Not on file    Active member of club or organization: Not on file    Attends meetings of clubs or organizations: Not on file    Relationship status: Not on file  . Intimate partner violence:    Fear of current or ex partner: Not on file    Emotionally abused: Not on file    Physically abused: Not on file    Forced sexual activity: Not on file  Other Topics Concern  . Not on file  Social History Narrative  . Not on file    Review of Systems Negative, with the exception of above mentioned in HPI     Objective:   Physical Exam BP 107/69 (BP Location: Left Arm, Patient Position: Sitting, Cuff Size: Normal)   Pulse 99   Temp 98 F (36.7  C) (Oral)   Ht 5\' 5"  (1.651 m)   Wt 134 lb 3.2 oz (60.9 kg)   LMP 02/01/2018   SpO2 98%   BMI 22.33 kg/m  Gen: Afebrile. No acute distress. Nontoxic in presentation. Very pleasant female.  HENT: AT. [place]. MMM.  Eyes:Pupils Equal Round Reactive to light, Extraocular movements intact,  Conjunctiva without redness, discharge or icterus. CV: RRR no murmur Chest: CTAB, no wheeze or crackles Abd: Soft. flat. NTND. BS present.  Skin: no rashes, purpura or petechiae.  Neuro:  Normal gait. PERLA. EOMi. Alert. Oriented x3    Assessment & Plan:  Suzanne Jarvis is a 27 y.o. female present for IBS follow up.  IBS (irritable bowel syndrome) - Stable today. Doing well on medication regimen.  - Continue dicyclomine (BENTYL) - Continue diphenoxylate-atropine (LOMOTIL) wean if able.  - refills provided for a year.  - F/U 1 year as long as doing ok. At yearly physical.    > 15 minutes spent with patient, >50% of time spent face to face  Electronically Signed by: Felix Pacini, DO [facility] [place]

## 2018-07-13 ENCOUNTER — Encounter: Payer: Self-pay | Admitting: Family Medicine

## 2018-07-13 ENCOUNTER — Ambulatory Visit: Payer: BLUE CROSS/BLUE SHIELD | Admitting: Family Medicine

## 2018-07-13 VITALS — BP 114/73 | HR 86 | Temp 98.5°F | Resp 20 | Ht 65.0 in | Wt 124.0 lb

## 2018-07-13 DIAGNOSIS — J340 Abscess, furuncle and carbuncle of nose: Secondary | ICD-10-CM

## 2018-07-13 MED ORDER — MUPIROCIN 2 % EX OINT: 1.0000 "application " | TOPICAL_OINTMENT | Freq: Two times a day (BID) | CUTANEOUS | 0 refills | Status: DC

## 2018-07-13 MED ORDER — DOXYCYCLINE HYCLATE 100 MG PO TABS: 100.0000 mg | ORAL_TABLET | Freq: Two times a day (BID) | ORAL | 0 refills | Status: DC

## 2018-07-13 NOTE — Progress Notes (Signed)
Suzanne Jarvis , July 30, 1991, 27 y.o., female MRN: [id number] Patient Care Team    Relationship Specialty Notifications Start End  Natalia Leatherwood, DO PCP - General Family Medicine  09/04/15     Chief Complaint  Patient presents with  . Facial Swelling    nasl drainage x 2 days     Subjective: Pt presents for an OV with complaints of nasal drainage and facial swelling of 2-3 days duration.  Associated symptoms include proximal nasal septum swelling and pain. She reports it started on the right side and then as spread to the left side last 2 days. She has tried to use neosporin over area. She denies fever, chills or bleeding. She became more worried when her nose started to swell.    No flowsheet data found.  Allergies  Allergen Reactions  . Amoxicillin   . Hyoscyamine Other (See Comments)  . Paxil [Paroxetine Hcl]     Dizziness   . Penicillins     REACTION: hives   Social History   Tobacco Use  . Smoking status: Former Smoker    Packs/day: 0.25    Years: 6.00    Pack years: 1.50    Types: Cigarettes  . Smokeless tobacco: Never Used  Substance Use Topics  . Alcohol use: Yes    Comment: rare   Past Medical History:  Diagnosis Date  . Asthma   . Chronic headaches   . IBS (irritable bowel syndrome)   . Tonsillitis    History reviewed. No pertinent surgical history. Family History  Problem Relation Age of Onset  . Allergies Mother   . Asthma Mother   . Hypertension Mother   . Arthritis Maternal Grandmother   . Irritable bowel syndrome Maternal Grandmother   . Lung cancer Other        paternal great grandfather  . Lung cancer Other        maternal great grandfather  . Cancer Other        Blood-great grandmother   Allergies as of 07/13/2018      Reactions   Amoxicillin    Hyoscyamine Other (See Comments)   Paxil [paroxetine Hcl]    Dizziness   Penicillins    REACTION: hives      Medication List        Accurate as of 07/13/18  3:32 PM. Always use  your most recent med list.          dicyclomine 10 MG capsule Commonly known as:  BENTYL TAKE 1 CAPSULE BY MOUTH FOUR TIMES DAILY BEFORE MEALS AND AT AT BEDTIME   diphenoxylate-atropine 2.5-0.025 MG tablet Commonly known as:  LOMOTIL Take 1 tablet by mouth 2 (two) times daily as needed for diarrhea or loose stools. TAKE 1 TABLET BY MOUTH FOUR TIMES DAILY AS NEEDED FOR DIARRHEA OR LOOSE STOOLS.   drospirenone-ethinyl estradiol 3-0.02 MG tablet Commonly known as:  YAZ,GIANVI,LORYNA Take 1 tablet by mouth daily.   multivitamin with minerals tablet Take 1 tablet by mouth daily.   IMMUNE SUPPORT PO Take 1 tablet by mouth daily.       All past medical history, surgical history, allergies, family history, immunizations andmedications were updated in the EMR today and reviewed under the history and medication portions of their EMR.     ROS: Negative, with the exception of above mentioned in HPI   Objective:  BP 114/73 (BP Location: Left Arm, Patient Position: Sitting, Cuff Size: Normal)   Pulse 86   Temp 98.5  F (36.9 C)   Resp 20   Ht 5\' 5"  (1.651 m)   Wt 124 lb (56.2 kg)   LMP 07/13/2018   SpO2 98%   BMI 20.63 kg/m  Body mass index is 20.63 kg/m. Gen: Afebrile. No acute distress. Nontoxic in appearance, well developed, well nourished.  HENT: AT. [place]. Bilateral TM visualized wnl. MMM, no oral lesions. Bilateral nares with distal nasal septum and vomer redness and swelling. TTp. Throat without erythema or exudates. No cough.  Eyes:Pupils Equal Round Reactive to light, Extraocular movements intact,  Conjunctiva without redness, discharge or icterus. Neck/lymp/endocrine: Supple,no lymphadenopathy CV: RRR  No exam data present No results found. No results found for this or any previous visit (from the past 24 hour(s)).  Assessment/Plan: Suzanne Fudgeheresa B Gregson is a 27 y.o. female present for OV for  Nasal septum ulceration Appears to be a small abscess or infection of the soft  tissue around distal nose.  - doxy BID. bactroban prescribed. Flush daily with nasal saline prior to applying ointment.  - mupirocin ointment (BACTROBAN) 2 %; Place 1 application into the nose 2 (two) times daily.  Dispense: 22 g; Refill: 0 - F/U PRN   Reviewed expectations re: course of current medical issues.  Discussed self-management of symptoms.  Outlined signs and symptoms indicating need for more acute intervention.  Patient verbalized understanding and all questions were answered.  Patient received an After-Visit Summary.    No orders of the defined types were placed in this encounter.    Note is dictated utilizing voice recognition software. Although note has been proof read prior to signing, occasional typographical errors still can be missed. If any questions arise, please do not hesitate to call for verification.   electronically signed by:  Felix Pacinienee [name], DO  [facility] [place]

## 2018-07-13 NOTE — Patient Instructions (Signed)
Nasal saline twice a day before applying ointment.  Doxycyline every 12 hrs for 10 days.  This appears to be an infection in the front of the nose, not a sinus infection.    Please help us help you:  We are honored [facility]. Below you will find basic instructions that you may need to access in the future. Please help us help you by reading the instructions, which cover many of the frequent questions we experience.   Prescription refills and request:  -In order to allow more efficient response time, please call your pharmacy for all refills. They will forward the request electronically to us. This allows for the quickest possible response. Request left on a nurse line can take longer to refill, since these are checked as time allows between office patients and other phone calls.  - refill request can take up to 3-5 working days to complete.  - If request is sent electronically and request is appropiate, it is usually completed in 1-2 business days.  - all patients will need to be seen routinely for all chronic medical conditions requiring prescription medications (see follow-up below). If you are overdue for follow up on your condition, you will be asked to make an appointment and we will call in enough medication to cover you until your appointment (up to 30 days).  - all controlled substances will require a face to face visit to request/refill.  - if you desire your prescriptions to go through a new pharmacy, and have an active script at original pharmacy, you will need to call your pharmacy and have scripts transferred to new pharmacy. This is completed between the pharmacy locations and not by your provider.    Results: If any images or labs were ordered, it can take up to 1 week to get results depending on the test ordered and the lab/facility running and resulting the test. - Normal or stable results, which do not need further discussion, may  be released to your mychart immediately with attached note to you. A call may not be generated for normal results. Please make certain to sign up for mychart. If you have questions on how to activate your mychart you can call the front office.  - If your results need further discussion, our office will attempt to contact you via phone, and if unable to reach you after 2 attempts, we will release your abnormal result to your mychart with instructions.  - All results will be automatically released in mychart after 1 week.  - Your provider will provide you with explanation and instruction on all relevant material in your results. Please keep in mind, results and labs may appear confusing or abnormal to the untrained eye, but it does not mean they are actually abnormal for you personally. If you have any questions about your results that are not covered, or you desire more detailed explanation than what was provided, you should make an appointment with your provider to do so.   Our office handles many outgoing and incoming calls daily. If we have not contacted you within 1 week about your results, please check your mychart to see if there is a message first and if not, then contact our office.  In helping with this matter, you help decrease call volume, and therefore allow us to be able to respond to patients needs more efficiently.   Acute office visits (sick visit):  An acute visit is intended  for a new problem and are scheduled in shorter time slots to allow schedule openings for patients with new problems. This is the appropriate visit to discuss a new problem. Problems will not be addressed by phone call or Echart message. Appointment is needed if requesting treatment. In order to provide you with excellent quality medical care with proper time for you to explain your problem, have an exam and receive treatment with instructions, these appointments should be limited to one new problem per visit. If you  experience a new problem, in which you desire to be addressed, please make an acute office visit, we save openings on the schedule to accommodate you. Please do not save your new problem for any other type of visit, let us take care of it properly and quickly for you.   Follow up visits:  Depending on your condition(s) your provider will need to see you routinely in order to provide you with quality care and prescribe medication(s). Most chronic conditions (Example: hypertension, Diabetes, depression/anxiety... etc), require visits a couple times a year. Your provider will instruct you on proper follow up for your personal medical conditions and history. Please make certain to make follow up appointments for your condition as instructed. Failing to do so could result in lapse in your medication treatment/refills. If you request a refill, and are overdue to be seen on a condition, we will always provide you with a 30 day script (once) to allow you time to schedule.    Medicare wellness (well visit): - we have a wonderful Nurse Maudie Mercury), that will meet with you and provide you will yearly medicare wellness visits. These visits should occur yearly (can not be scheduled less than 1 calendar year apart) and cover preventive health, immunizations, advance directives and screenings you are entitled to yearly through your medicare benefits. Do not miss out on your entitled benefits, this is when medicare will pay for these benefits to be ordered for you.  These are strongly encouraged by your provider and is the appropriate type of visit to make certain you are up to date with all preventive health benefits. If you have not had your medicare wellness exam in the last 12 months, please make certain to schedule one by calling the office and schedule your medicare wellness with Maudie Mercury as soon as possible.   Yearly physical (well visit):  - Adults are recommended to be seen yearly for physicals. Check with your insurance  and date of your last physical, most insurances require one calendar year between physicals. Physicals include all preventive health topics, screenings, medical exam and labs that are appropriate for gender/age and history. You may have fasting labs needed at this visit. This is a well visit (not a sick visit), new problems should not be covered during this visit (see acute visit).  - Pediatric patients are seen more frequently when they are younger. Your provider will advise you on well child visit timing that is appropriate for your their age. - This is not a medicare wellness visit. Medicare wellness exams do not have an exam portion to the visit. Some medicare companies allow for a physical, some do not allow a yearly physical. If your medicare allows a yearly physical you can schedule the medicare wellness with our nurse Maudie Mercury and have your physical with your provider after, on the same day. Please check with insurance for your full benefits.   Late Policy/No Shows:  - all new patients should arrive 15-30 minutes earlier than appointment  to allow [place] time  to  obtain all personal demographics,  insurance information and for you to complete office paperwork. - All established patients should arrive 10-15 minutes earlier than appointment time to update all information and be checked in .  - In our best efforts to run on time, if you are late for your appointment you will be asked to either reschedule or if able, we will work you back into the schedule. There will be a wait time to work you back in the schedule,  depending on availability.  - If you are unable to make it to your appointment as scheduled, please call 24 hours ahead of time to allow [place] to fill the time slot with someone else who needs to be seen. If you do not cancel your appointment ahead of time, you may be charged a no show fee.

## 2018-08-29 ENCOUNTER — Ambulatory Visit (INDEPENDENT_AMBULATORY_CARE_PROVIDER_SITE_OTHER): Payer: BLUE CROSS/BLUE SHIELD

## 2018-08-29 DIAGNOSIS — Z23 Encounter for immunization: Secondary | ICD-10-CM

## 2018-09-13 ENCOUNTER — Encounter: Payer: Self-pay | Admitting: Family Medicine

## 2018-09-13 ENCOUNTER — Ambulatory Visit: Payer: BLUE CROSS/BLUE SHIELD | Admitting: Family Medicine

## 2018-09-13 VITALS — BP 113/74 | HR 75 | Temp 98.1°F | Resp 20 | Ht 65.0 in | Wt 120.0 lb

## 2018-09-13 DIAGNOSIS — J0100 Acute maxillary sinusitis, unspecified: Secondary | ICD-10-CM | POA: Diagnosis not present

## 2018-09-13 MED ORDER — DOXYCYCLINE HYCLATE 100 MG PO TABS: 100.0000 mg | ORAL_TABLET | Freq: Two times a day (BID) | ORAL | 0 refills | Status: DC

## 2018-09-13 NOTE — Patient Instructions (Signed)
Rest, hydrate.  + flonase, try coricidn, nettie pot or nasal saline.  Doxycyline  prescribed, take until completed.  If cough present it can last up to 6-8 weeks.  F/U 2 weeks of not improved.    Sinusitis, Adult Sinusitis is soreness and inflammation of your sinuses. Sinuses are hollow spaces in the bones around your face. They are located:  Around your eyes.  In the middle of your forehead.  Behind your nose.  In your cheekbones.  Your sinuses and nasal passages are lined with a stringy fluid (mucus). Mucus normally drains out of your sinuses. When your nasal tissues get inflamed or swollen, the mucus can get trapped or blocked so air cannot flow through your sinuses. This lets bacteria, viruses, and funguses grow, and that leads to infection. Follow these instructions at home: Medicines  Take, use, or apply over-the-counter and prescription medicines only as told by your doctor. These may include nasal sprays.  If you were prescribed an antibiotic medicine, take it as told by your doctor. Do not stop taking the antibiotic even if you start to feel better. Hydrate and Humidify  Drink enough water to keep your pee (urine) clear or pale yellow.  Use a cool mist humidifier to keep the humidity level in your home above 50%.  Breathe in steam for 10-15 minutes, 3-4 times a day or as told by your doctor. You can do this in the bathroom while a hot shower is running.  Try not to spend time in cool or dry air. Rest  Rest as much as possible.  Sleep with your head raised (elevated).  Make sure to get enough sleep each night. General instructions  Put a warm, moist washcloth on your face 3-4 times a day or as told by your doctor. This will help with discomfort.  Wash your hands often with soap and water. If there is no soap and water, use hand sanitizer.  Do not smoke. Avoid being around people who are smoking (secondhand smoke).  Keep all follow-up visits as told by your  doctor. This is important. Contact a doctor if:  You have a fever.  Your symptoms get worse.  Your symptoms do not get better within 10 days. Get help right away if:  You have a very bad headache.  You cannot stop throwing up (vomiting).  You have pain or swelling around your face or eyes.  You have trouble seeing.  You feel confused.  Your neck is stiff.  You have trouble breathing. This information is not intended to replace advice given to you by your health care provider. Make sure you discuss any questions you have with your health care provider. Document Released: 04/18/2008 Document Revised: 06/26/2016 Document Reviewed: 08/26/2015 Elsevier Interactive Patient Education  [employer].

## 2018-09-13 NOTE — Progress Notes (Signed)
Suzanne Jarvis , 07/05/1991, 27 y.o., female MRN: [id number] Patient Care Team    Relationship Specialty Notifications Start End  Natalia Leatherwood, DO PCP - General Family Medicine  09/04/15     Chief Complaint  Patient presents with  . URI    chest congestion,drainage headache x 4 days     Subjective: Pt presents for an OV with complaints of chest congestion and cough of 4-5 days duration.  Associated symptoms include nasal congestion, runny nose, sore throat, headache, right sided facial pressure, ear pressure, nausea. She denies fever or chills.  Pt has tried ibuprophen to ease their symptoms. 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 Her symptoms are worsening over last 2 days.  No flowsheet data found.  Allergies  Allergen Reactions  . Amoxicillin   . Hyoscyamine Other (See Comments)  . Paxil [Paroxetine Hcl]     Dizziness   . Penicillins     REACTION: hives   Social History   Tobacco Use  . Smoking status: Former Smoker    Packs/day: 0.25    Years: 6.00    Pack years: 1.50    Types: Cigarettes  . Smokeless tobacco: Never Used  Substance Use Topics  . Alcohol use: Yes    Comment: rare   Past Medical History:  Diagnosis Date  . Asthma   . Chronic headaches   . IBS (irritable bowel syndrome)   . Tonsillitis    History reviewed. No pertinent surgical history. Family History  Problem Relation Age of Onset  . Allergies Mother   . Asthma Mother   . Hypertension Mother   . Arthritis Maternal Grandmother   . Irritable bowel syndrome Maternal Grandmother   . Lung cancer Other        paternal great grandfather  . Lung cancer Other        maternal great grandfather  . Cancer Other        Blood-great grandmother   Allergies as of 09/13/2018      Reactions   Amoxicillin    Hyoscyamine Other (See Comments)   Paxil [paroxetine Hcl]    Dizziness   Penicillins    REACTION: hives      Medication List        Accurate as of 09/13/18 10:03 AM. Always use your most  recent med list.          dicyclomine 10 MG capsule Commonly known as:  BENTYL TAKE 1 CAPSULE BY MOUTH FOUR TIMES DAILY BEFORE MEALS AND AT AT BEDTIME   diphenoxylate-atropine 2.5-0.025 MG tablet Commonly known as:  LOMOTIL Take 1 tablet by mouth 2 (two) times daily as needed for diarrhea or loose stools. TAKE 1 TABLET BY MOUTH FOUR TIMES DAILY AS NEEDED FOR DIARRHEA OR LOOSE STOOLS.   drospirenone-ethinyl estradiol 3-0.02 MG tablet Commonly known as:  YAZ,GIANVI,LORYNA Take 1 tablet by mouth daily.   multivitamin with minerals tablet Take 1 tablet by mouth daily.       All past medical history, surgical history, allergies, family history, immunizations andmedications were updated in the EMR today and reviewed under the history and medication portions of their EMR.     ROS: Negative, with the exception of above mentioned in HPI   Objective:  BP 113/74 (BP Location: Right Arm, Patient Position: Sitting, Cuff Size: Normal)   Pulse 75   Temp 98.1 F (36.7 C)   Resp 20   Ht 5\' 5"  (1.651 m)   Wt 120 lb (54.4 kg)   SpO2  98%   BMI 19.97 kg/m  Body mass index is 19.97 kg/m. Gen: Afebrile. No acute distress. Nontoxic in appearance, well developed, well nourished.  HENT: AT. [place]. Bilateral TM visualized bilateral fullness and pink tone. MMM, no oral lesions. Bilateral nares w/erythema, drainage present. Throat without erythema or exudates. Cough and hoarseness present.  Eyes:Pupils Equal Round Reactive to light, Extraocular movements intact,  Conjunctiva without redness, discharge or icterus. Neck/lymp/endocrine: Supple,bilateral ant cervical  Lymphadenopathy present CV: RRR Chest: CTAB, no wheeze or crackles. Good air movement, normal resp effort.  Abd: Soft. NTND. BS present.  Skin: no rashes, purpura or petechiae.  Neuro:  Normal gait. PERLA. EOMi. Alert. Oriented x3   No exam data present No results found. No results found for this or any previous visit (from the past 24  hour(s)).  Assessment/Plan: Suzanne Jarvis is a 27 y.o. female present for OV for  Acute non-recurrent maxillary sinusitis Rest, hydrate.  + flonase, try coricidn, nettie pot or nasal saline.  Doxycyline  prescribed, take until completed.  If cough present it can last up to 6-8 weeks.  F/U 2 weeks of not improved.     Reviewed expectations re: course of current medical issues.  Discussed self-management of symptoms.  Outlined signs and symptoms indicating need for more acute intervention.  Patient verbalized understanding and all questions were answered.  Patient received an After-Visit Summary.    No orders of the defined types were placed in this encounter.    Note is dictated utilizing voice recognition software. Although note has been proof read prior to signing, occasional typographical errors still can be missed. If any questions arise, please do not hesitate to call for verification.   electronically signed by:  Felix Pacini, DO  [facility] [place]

## 2018-09-21 ENCOUNTER — Encounter: Payer: Self-pay | Admitting: Family Medicine

## 2018-09-21 ENCOUNTER — Ambulatory Visit: Payer: Self-pay | Admitting: *Deleted

## 2018-09-21 ENCOUNTER — Ambulatory Visit: Payer: BLUE CROSS/BLUE SHIELD | Admitting: Family Medicine

## 2018-09-21 VITALS — BP 111/73 | HR 69 | Resp 16 | Ht 65.0 in | Wt 121.0 lb

## 2018-09-21 DIAGNOSIS — K2100 Gastro-esophageal reflux disease with esophagitis, without bleeding: Secondary | ICD-10-CM

## 2018-09-21 MED ORDER — PANTOPRAZOLE SODIUM 40 MG PO TBEC: DELAYED_RELEASE_TABLET | ORAL | 1 refills | Status: DC

## 2018-09-21 NOTE — Telephone Encounter (Signed)
Patient has IBS- patient ate Pizza Wednesday night and had pain in chest- she had a lot of gas and burping. Patient states when she eats she has pain and she feels pain in chest if she burps.  TUMS, gas X, Gum, ginger, acid reflux medication- over the counter today.  Patient is not able to calm the reflux she is feeling with over the counter medication- she has not tried Pedcid/Prilosec on regular basis. Call to office- appointment made.  Reason for Disposition . Chest pain lasts > 5 minutes (Exceptions: chest pain occurring > 3 days ago and now asymptomatic; same as previously diagnosed heartburn and has accompanying sour taste in mouth)  Answer Assessment - Initial Assessment Questions 1. LOCATION: "Where does it hurt?"       In between breat 2. RADIATION: "Does the pain go anywhere else?" (e.g., into neck, jaw, arms, back)     When she swallows food- she feels burning sensation in chest 3. ONSET: "When did the chest pain begin?" (Minutes, hours or days)      Wednesday night/Thursday morning 4. PATTERN "Does the pain come and go, or has it been constant since it started?"  "Does it get worse with exertion?"      Comes and goes, yesterday it did 5. DURATION: "How long does it last" (e.g., seconds, minutes, hours)     Seconds- especially when eating- with cold liquids- it does not hurt- warm it does 6. SEVERITY: "How bad is the pain?"  (e.g., Scale 1-10; mild, moderate, or severe)    - MILD (1-3): doesn't interfere with normal activities     - MODERATE (4-7): interferes with normal activities or awakens from sleep    - SEVERE (8-10): excruciating pain, unable to do any normal activities       Now- 2 7. CARDIAC RISK FACTORS: "Do you have any history of heart problems or risk factors for heart disease?" (e.g., prior heart attack, angina; high blood pressure, diabetes, being overweight, high cholesterol, smoking, or strong family history of heart disease)     no 8. PULMONARY RISK FACTORS: "Do  you have any history of lung disease?"  (e.g., blood clots in lung, asthma, emphysema, birth control pills)     OCP 9. CAUSE: "What do you think is causing the chest pain?"     IBS- takes medication 10. OTHER SYMPTOMS: "Do you have any other symptoms?" (e.g., dizziness, nausea, vomiting, sweating, fever, difficulty breathing, cough)       Slight cough- getting over sinus infection, nausea yesterday 11. PREGNANCY: "Is there any chance you are pregnant?" "When was your last menstrual period?"       OCP- LMP-09/09/18  Protocols used: CHEST PAIN-A-AH

## 2018-09-21 NOTE — Patient Instructions (Signed)
Take one generic, over the counter pepcid 20mg  tab every night before bed.   Food Choices for Gastroesophageal Reflux Disease, Adult When you have gastroesophageal reflux disease (GERD), the foods you eat and your eating habits are very important. Choosing the right foods can help ease the discomfort of GERD. Consider working with a diet and nutrition specialist (dietitian) to help you make healthy food choices. What general guidelines should I follow? Eating plan  Choose healthy foods low in fat, such as fruits, vegetables, whole grains, low-fat dairy products, and lean meat, fish, and poultry.  Eat frequent, small meals instead of three large meals each day. Eat your meals slowly, in a relaxed setting. Avoid bending over or lying down until 2-3 hours after eating.  Limit high-fat foods such as fatty meats or fried foods.  Limit your intake of oils, butter, and shortening to less than 8 teaspoons each day.  Avoid the following: ? Foods that cause symptoms. These may be different for different people. Keep a food diary to keep track of foods that cause symptoms. ? Alcohol. ? Drinking large amounts of liquid with meals. ? Eating meals during the 2-3 hours before bed.  Cook foods using methods other than frying. This may include baking, grilling, or broiling. Lifestyle   Maintain a healthy weight. Ask your health care provider what weight is healthy for you. If you need to lose weight, work with your health care provider to do so safely.  Exercise for at least 30 minutes on 5 or more days each week, or as told by your health care provider.  Avoid wearing clothes that fit tightly around your waist and chest.  Do not use any products that contain nicotine or tobacco, such as cigarettes and e-cigarettes. If you need help quitting, ask your health care provider.  Sleep with the head of your bed raised. Use a wedge under the mattress or blocks under the bed frame to raise the head of the  bed. What foods are not recommended? The items listed may not be a complete list. Talk with your dietitian about what dietary choices are best for you. Grains Pastries or quick breads with added fat. [place] toast. Vegetables Deep fried vegetables. [place] fries. Any vegetables prepared with added fat. Any vegetables that cause symptoms. For some people this may include tomatoes and tomato products, chili peppers, onions and garlic, and horseradish. Fruits Any fruits prepared with added fat. Any fruits that cause symptoms. For some people this may include citrus fruits, such as oranges, grapefruit, pineapple, and lemons. Meats and other protein foods High-fat meats, such as fatty beef or pork, hot dogs, ribs, ham, sausage, salami and bacon. Fried meat or protein, including fried fish and fried chicken. Nuts and nut butters. Dairy Whole milk and chocolate milk. Sour cream. Cream. Ice cream. Cream cheese. Milk shakes. Beverages Coffee and tea, with or without caffeine. Carbonated beverages. Sodas. Energy drinks. Fruit juice made with acidic fruits (such as orange or grapefruit). Tomato juice. Alcoholic drinks. Fats and oils Butter. Margarine. Shortening. Ghee. Sweets and desserts Chocolate and cocoa. Donuts. Seasoning and other foods Pepper. Peppermint and spearmint. Any condiments, herbs, or seasonings that cause symptoms. For some people, this may include curry, hot sauce, or vinegar-based salad dressings. Summary  When you have gastroesophageal reflux disease (GERD), food and lifestyle choices are very important to help ease the discomfort of GERD.  Eat frequent, small meals instead of three large meals each day. Eat your meals slowly, in a relaxed  setting. Avoid bending over or lying down until 2-3 hours after eating.  Limit high-fat foods such as fatty meat or fried foods. This information is not intended to replace advice given to you by your health care provider. Make sure you discuss  any questions you have with your health care provider. Document Released: 10/31/2005 Document Revised: 11/01/2016 Document Reviewed: 11/01/2016 Elsevier Interactive Patient Education  [employer].

## 2018-09-21 NOTE — Progress Notes (Signed)
OFFICE VISIT  09/21/2018   CC:  Chief Complaint  Patient presents with  . Chest Pain    ? reflux     HPI:    Patient is a 27 y.o. Caucasian female with hx of IBS who presents for "reflux pain". Onset 2 d/a in the night, burning substernal CP kept waking her up. Next day lots of burping, substernal burning pain.  Some brief ST and water brash yesterday. Tums no help.  Just took nexium otc starting today. No palpitations.  No fevers.  Of note, she was put on doxycycline 9 d/a for acute sinusitis.  She has been taking this with food. No probs with reflux from this med.  Past Medical History:  Diagnosis Date  . Asthma   . Chronic headaches   . IBS (irritable bowel syndrome)   . Tonsillitis     History reviewed. No pertinent surgical history.  Outpatient Medications Prior to Visit  Medication Sig Dispense Refill  . dicyclomine (BENTYL) 10 MG capsule TAKE 1 CAPSULE BY MOUTH FOUR TIMES DAILY BEFORE MEALS AND AT AT BEDTIME 120 capsule 3  . diphenoxylate-atropine (LOMOTIL) 2.5-0.025 MG tablet Take 1 tablet by mouth 2 (two) times daily as needed for diarrhea or loose stools. TAKE 1 TABLET BY MOUTH FOUR TIMES DAILY AS NEEDED FOR DIARRHEA OR LOOSE STOOLS. 180 tablet 3  . doxycycline (VIBRA-TABS) 100 MG tablet Take 1 tablet (100 mg total) by mouth 2 (two) times daily. 20 tablet 0  . drospirenone-ethinyl estradiol (YAZ,GIANVI,LORYNA) 3-0.02 MG tablet Take 1 tablet by mouth daily.      . Multiple Vitamins-Minerals (MULTIVITAMIN WITH MINERALS) tablet Take 1 tablet by mouth daily.     No facility-administered medications prior to visit.     Allergies  Allergen Reactions  . Amoxicillin   . Hyoscyamine Other (See Comments)  . Paxil [Paroxetine Hcl]     Dizziness   . Penicillins     REACTION: hives    ROS As per HPI  PE: Blood pressure 111/73, pulse 69, resp. rate 16, height 5\' 5"  (1.651 m), weight 121 lb (54.9 kg), SpO2 98 %. Exam chaperoned by Vanetta Mulders, CMA. ENT: Ears:  EACs clear, normal epithelium.  TMs with good light reflex and landmarks bilaterally.  Eyes: no injection, icteris, swelling, or exudate.  EOMI, PERRLA. Nose: no drainage or turbinate edema/swelling.  No injection or focal lesion.  Mouth: lips without lesion/swelling.  Oral mucosa pink and moist.  Dentition intact and without obvious caries or gingival swelling.  Oropharynx without erythema, exudate, or swelling.  Neck - No masses or thyromegaly or limitation in range of motion CV: RRR, no m/r/g.   LUNGS: CTA bilat, nonlabored resps, good aeration in all lung fields.   LABS:    Chemistry      Component Value Date/Time   NA 135 09/30/2015 1618   K 4.2 09/30/2015 1618   CL 99 09/30/2015 1618   CO2 23 09/30/2015 1618   BUN 7 09/30/2015 1618   CREATININE 0.72 09/30/2015 1618      Component Value Date/Time   CALCIUM 9.6 09/30/2015 1618   ALKPHOS 50 09/30/2015 1618   AST 17 09/30/2015 1618   ALT 12 09/30/2015 1618   BILITOT 0.7 09/30/2015 1618       IMPRESSION AND PLAN:  GERD with esophagitis. GERD diet, elevation of head of bed discussed. Start pantoprazole 40 mg qAM and take otc generic pepcid 20mg  qhs--->for the next 5d. Then use either the pantoprazole or pepcid on prn  basis.  An After Visit Summary was printed and given to the patient.  FOLLOW UP: Return if symptoms worsen or fail to improve.  Signed:  Santiago Bumpers, MD           09/21/2018

## 2018-09-24 ENCOUNTER — Emergency Department (HOSPITAL_COMMUNITY)
Admission: EM | Admit: 2018-09-24 | Discharge: 2018-09-24 | Disposition: A | Discharge status: Home / Self Care | Payer: BLUE CROSS/BLUE SHIELD | Attending: Emergency Medicine | Admitting: Emergency Medicine

## 2018-09-24 ENCOUNTER — Encounter (HOSPITAL_COMMUNITY): Payer: Self-pay | Admitting: Emergency Medicine

## 2018-09-24 ENCOUNTER — Ambulatory Visit: Payer: Self-pay

## 2018-09-24 ENCOUNTER — Emergency Department (HOSPITAL_COMMUNITY): Payer: BLUE CROSS/BLUE SHIELD

## 2018-09-24 ENCOUNTER — Telehealth: Payer: Self-pay

## 2018-09-24 DIAGNOSIS — Z79899 Other long term (current) drug therapy: Secondary | ICD-10-CM | POA: Insufficient documentation

## 2018-09-24 DIAGNOSIS — Z87891 Personal history of nicotine dependence: Secondary | ICD-10-CM | POA: Insufficient documentation

## 2018-09-24 DIAGNOSIS — J45909 Unspecified asthma, uncomplicated: Secondary | ICD-10-CM | POA: Diagnosis not present

## 2018-09-24 DIAGNOSIS — R0789 Other chest pain: Secondary | ICD-10-CM

## 2018-09-24 DIAGNOSIS — R079 Chest pain, unspecified: Secondary | ICD-10-CM | POA: Diagnosis present

## 2018-09-24 LAB — CBC
HEMATOCRIT: 38.9 % (ref 36.0–46.0)
Hemoglobin: 13 g/dL (ref 12.0–15.0)
MCH: 31.1 pg (ref 26.0–34.0)
MCHC: 33.4 g/dL (ref 30.0–36.0)
MCV: 93.1 fL (ref 80.0–100.0)
Platelets: 174 10*3/uL (ref 150–400)
RBC: 4.18 MIL/uL (ref 3.87–5.11)
RDW: 12.1 % (ref 11.5–15.5)
WBC: 7.3 10*3/uL (ref 4.0–10.5)
nRBC: 0 % (ref 0.0–0.2)

## 2018-09-24 LAB — BASIC METABOLIC PANEL
Anion gap: 7 (ref 5–15)
BUN: 9 mg/dL (ref 6–20)
CO2: 26 mmol/L (ref 22–32)
CREATININE: 0.77 mg/dL (ref 0.44–1.00)
Calcium: 9.2 mg/dL (ref 8.9–10.3)
Chloride: 104 mmol/L (ref 98–111)
GFR calc Af Amer: >60 mL/min (ref >60)
GLUCOSE: 93 mg/dL (ref 70–99)
Potassium: 3.5 mmol/L (ref 3.5–5.1)
SODIUM: 137 mmol/L (ref 135–145)

## 2018-09-24 LAB — I-STAT BETA HCG BLOOD, ED (MC, WL, AP ONLY): I-stat hCG, quantitative: <5 m[IU]/mL (ref <5)

## 2018-09-24 LAB — I-STAT TROPONIN, ED: Troponin i, poc: 0 ng/mL (ref 0.00–0.08)

## 2018-09-24 LAB — D-DIMER, QUANTITATIVE (NOT AT ARMC): D DIMER QUANT: 0.46 ug{FEU}/mL (ref 0.00–0.50)

## 2018-09-24 MED ORDER — IBUPROFEN 800 MG PO TABS: 800.0000 mg | ORAL_TABLET | Freq: Three times a day (TID) | ORAL | 0 refills | Status: DC | PRN

## 2018-09-24 MED ORDER — LIDOCAINE VISCOUS HCL 2 % MT SOLN
15.0000 mL | Freq: Once | OROMUCOSAL | Status: AC
  Administered 2018-09-24: 15 mL via ORAL
  Filled 2018-09-24: qty 15

## 2018-09-24 MED ORDER — ALUM & MAG HYDROXIDE-SIMETH 200-200-20 MG/5ML PO SUSP
30.0000 mL | Freq: Once | ORAL | Status: AC
  Administered 2018-09-24: 30 mL via ORAL
  Filled 2018-09-24: qty 30

## 2018-09-24 MED ORDER — KETOROLAC TROMETHAMINE 15 MG/ML IJ SOLN
15.0000 mg | Freq: Once | INTRAMUSCULAR | Status: AC
  Administered 2018-09-24: 15 mg via INTRAVENOUS
  Filled 2018-09-24: qty 1

## 2018-09-24 NOTE — Discharge Instructions (Addendum)
Continue taking your pantoprazole as prescribed.  You can take maalox and tylenol available over the counter according to label instructions.
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
## 2018-09-24 NOTE — ED Triage Notes (Signed)
Pt states she has been having acid reflux/ constant chest pain. Pt went to [facility] on Friday and was diagnosed with acid reflux and given medication for it, which has not help. Pt has been taking ibuprofen for the pain. Pain worsens with deep breaths. Denies nausea.

## 2018-09-24 NOTE — ED Notes (Signed)
Pt in xray

## 2018-09-24 NOTE — ED Provider Notes (Signed)
[facility] EMERGENCY DEPARTMENT Provider Note   CSN: [id number] Arrival date & time: 09/24/18  4098     History   Chief Complaint Chief Complaint  Patient presents with  . Chest Pain    HPI Suzanne Jarvis is a 27 y.o. female.  The history is provided by the patient. No language interpreter was used.  Chest Pain     Suzanne Jarvis is a 27 y.o. female who presents to the Emergency Department complaining of chest pain. She presents to the emergency department complaining of chest pain that began on Wednesday. She reports that it started out as a reflux type sensation but then progressed to a sharp central pain. Pain is worse with swallowing food and drink as well as with deep breaths and movement. She has some mild associated cough. She saw her PCP on Friday and was started on pantoprazole. She did not take the medication today. She took ibuprofen today with mild improvement in her pain. She has a history of IBS. She takes birth control and she waves. No history of blood clots. She denies any fevers, shortness of breath, hemoptysis, abdominal pain, nausea, vomiting, leg swelling or pain. Symptoms are moderate and constant nature. Past Medical History:  Diagnosis Date  . Asthma   . Chronic headaches   . IBS (irritable bowel syndrome)   . Tonsillitis     Patient Active Problem List   Diagnosis Date Noted  . IBS (irritable bowel syndrome) 09/30/2015  . Erythema migrans (Lyme disease) 07/15/2015  . Vitamin B 12 deficiency 12/10/2014    History reviewed. No pertinent surgical history.   OB History   None      Home Medications    Prior to Admission medications   Medication Sig Start Date End Date Taking? Authorizing Provider  dicyclomine (BENTYL) 10 MG capsule TAKE 1 CAPSULE BY MOUTH FOUR TIMES DAILY BEFORE MEALS AND AT AT BEDTIME 02/21/18   [name], [name], DO  diphenoxylate-atropine (LOMOTIL) 2.5-0.025 MG tablet Take 1 tablet by mouth 2 (two) times  daily as needed for diarrhea or loose stools. TAKE 1 TABLET BY MOUTH FOUR TIMES DAILY AS NEEDED FOR DIARRHEA OR LOOSE STOOLS. 02/21/18   [name], [name], DO  doxycycline (VIBRA-TABS) 100 MG tablet Take 1 tablet (100 mg total) by mouth 2 (two) times daily. 09/13/18   [name], [name], DO  drospirenone-ethinyl estradiol (YAZ,GIANVI,LORYNA) 3-0.02 MG tablet Take 1 tablet by mouth daily.      [provider]  ibuprofen (ADVIL,MOTRIN) 800 MG tablet Take 1 tablet (800 mg total) by mouth every 8 (eight) hours as needed. 09/24/18   Tilden Fossa, MD  Multiple Vitamins-Minerals (MULTIVITAMIN WITH MINERALS) tablet Take 1 tablet by mouth daily.    [provider]  pantoprazole (PROTONIX) 40 MG tablet 1 tab po qd prn 09/21/18   McGowen, Maryjean Morn, MD    Family History Family History  Problem Relation Age of Onset  . Allergies Mother   . Asthma Mother   . Hypertension Mother   . Arthritis Maternal Grandmother   . Irritable bowel syndrome Maternal Grandmother   . Lung cancer Other        paternal great grandfather  . Lung cancer Other        maternal great grandfather  . Cancer Other        Blood-great grandmother    Social History Social History   Tobacco Use  . Smoking status: Former Smoker    Packs/day: 0.25  Years: 6.00    Pack years: 1.50    Types: Cigarettes  . Smokeless tobacco: Never Used  Substance Use Topics  . Alcohol use: Yes    Comment: rare  . Drug use: No     Allergies   Amoxicillin; Hyoscyamine; Paxil [paroxetine hcl]; and Penicillins   Review of Systems Review of Systems  Cardiovascular: Positive for chest pain.  All other systems reviewed and are negative.    Physical Exam Updated Vital Signs BP 130/75 (BP Location: Right Arm)   Pulse 80   Temp 98.8 F (37.1 C) (Oral)   Resp 16   Ht 5\' 5"  (1.651 m)   Wt 78 kg   LMP 09/09/2018   SpO2 100%   BMI 28.62 kg/m   Physical Exam  Constitutional: She is oriented to person, place, and  time. She appears well-developed and well-nourished.  HENT:  Head: Normocephalic and atraumatic.  Cardiovascular: Normal rate and regular rhythm.  No murmur heard. Pulmonary/Chest: Effort normal and breath sounds normal. No respiratory distress.  Left sided chest wall tenderness  Abdominal: Soft. There is no tenderness. There is no rebound and no guarding.  Musculoskeletal: She exhibits no edema or tenderness.  Neurological: She is alert and oriented to person, place, and time.  Skin: Skin is warm and dry.  Psychiatric: She has a normal mood and affect. Her behavior is normal.  Nursing note and vitals reviewed.    ED Treatments / Results  Labs (all labs ordered are listed, but only abnormal results are displayed) Labs Reviewed  BASIC METABOLIC PANEL  CBC  D-DIMER, QUANTITATIVE (NOT AT [facility])  I-STAT TROPONIN, ED  I-STAT BETA HCG BLOOD, ED ([facility], [facility], [facility] ONLY)    EKG EKG Interpretation  Date/Time:  Monday September 24 2018 09:41:02 EST Ventricular Rate:  86 PR Interval:    QRS Duration: 83 QT Interval:  358 QTC Calculation: 429 R Axis:   63 Text Interpretation:  Sinus rhythm no prior available for comparison Confirmed by Tilden Fossa [phone]) on 09/24/2018 9:42:47 AM   Radiology Dg Chest 2 View  Result Date: 09/24/2018 CLINICAL DATA:  Chest pain beginning today EXAM: CHEST - 2 VIEW COMPARISON:  08/19/2010 FINDINGS: Heart size is normal. Mediastinal shadows are normal. The lungs are clear. No bronchial thickening. No infiltrate, mass, effusion or collapse. Pulmonary vascularity is normal. No bony abnormality. IMPRESSION: Normal chest Electronically Signed   By: Paulina Fusi M.D.   On: 09/24/2018 10:13    Procedures Procedures (including critical care time)  Medications Ordered in ED Medications  ketorolac (TORADOL) 15 MG/ML injection 15 mg (has no administration in time range)  alum & mag hydroxide-simeth (MAALOX/MYLANTA) 200-200-20 MG/5ML suspension 30 mL (30 mLs Oral  Given 09/24/18 1018)    And  lidocaine (XYLOCAINE) 2 % viscous mouth solution 15 mL (15 mLs Oral Given 09/24/18 1018)     Initial Impression / Assessment and Plan / ED Course  I have reviewed the triage vital signs and the nursing notes.  Pertinent labs & imaging results that were available during my care of the patient were reviewed by me and considered in my medical decision making (see chart for details).     Here for evaluation of sharp chest pain that is been present for the last few days. She does have reproducible chest wall tenderness on examination. Presentation is not consistent with ACS, dissection. D dimer is negative, presentation not consistent with PE. She did have transient improvement in symptoms following G.I. cocktail only to  have recurrent pain. Overall presentation is consistent with chest wall pain, but given the reflex component will continue treatment for reflux with pantoprazole, adding Maalox. Discussed outpatient follow-up and return precautions. Final Clinical Impressions(s) / ED Diagnoses   Final diagnoses:  Chest wall pain    ED Discharge Orders         Ordered    ibuprofen (ADVIL,MOTRIN) 800 MG tablet  Every 8 hours PRN     09/24/18 1124           Tilden Fossa, MD 09/24/18 1127

## 2018-09-24 NOTE — Telephone Encounter (Signed)
Phone call received from Okey Regal, nurse triage, regarding patient. Patient c/o worsening chest pain with stabbing feeling, pain 6 out of 10. Shortness of Breath. Taking medication for reflux and Ibuprofen with no relief. Dr. Milinda Cave notified and instructed patient to go to ED. Triage nurse informed.

## 2018-09-26 ENCOUNTER — Ambulatory Visit: Payer: BLUE CROSS/BLUE SHIELD | Admitting: Family Medicine

## 2018-09-26 ENCOUNTER — Encounter: Payer: Self-pay | Admitting: Family Medicine

## 2018-09-26 ENCOUNTER — Encounter: Payer: Self-pay | Admitting: Gastroenterology

## 2018-09-26 VITALS — BP 114/75 | HR 75 | Temp 98.1°F | Resp 20 | Ht 65.0 in | Wt 119.0 lb

## 2018-09-26 DIAGNOSIS — R0789 Other chest pain: Secondary | ICD-10-CM

## 2018-09-26 DIAGNOSIS — R1319 Other dysphagia: Secondary | ICD-10-CM

## 2018-09-26 DIAGNOSIS — R1310 Dysphagia, unspecified: Secondary | ICD-10-CM

## 2018-09-26 DIAGNOSIS — R1013 Epigastric pain: Secondary | ICD-10-CM | POA: Diagnosis not present

## 2018-09-26 LAB — H. PYLORI ANTIBODY, IGG: H Pylori IgG: NEGATIVE

## 2018-09-26 MED ORDER — FAMOTIDINE 40 MG PO TABS: 40.0000 mg | ORAL_TABLET | Freq: Every day | ORAL | 0 refills | Status: DC

## 2018-09-26 MED ORDER — SUCRALFATE 1 G PO TABS: 1.0000 g | ORAL_TABLET | Freq: Three times a day (TID) | ORAL | 0 refills | Status: DC

## 2018-09-26 NOTE — Progress Notes (Signed)
Suzanne Jarvis , 1991/02/21, 27 y.o., female MRN: [id number] Patient Care Team    Relationship Specialty Notifications Start End  [name], DO PCP - General Family Medicine  09/04/15     Chief Complaint  Patient presents with  . Gastroesophageal Reflux    follow up ED visit     Subjective: Pt presents for an OV for f/u on chest pain from ED visit and provider visit with Dr. Anitra Lauth prior to ED visit. Reviewed both visit notes today.  CBC, BMP, trop, D-dimer, CXR negative for acute cause of pain. Pt reports she has been taking the protonix prescribed, without great resolution. She has been taking the ibuprofen recommend by ED. She is now unable to eat without severe discomfort to her ---> points to lower esophagus/upper epigastric region. She denies vomit, fever, chills or unintentional weight loss. She reports it feels like she is "swallowing glass" when it gets to the lower esophagus. She endorses heart burn symptoms last week. She did just finish a round of doxycyline (tolerated in the past) for sinus infection. She does not reports a pill getting stuck in the throat.   Dg Chest 2 View  Result Date: 09/24/2018 CLINICAL DATA:  Chest pain beginning today EXAM: CHEST - 2 VIEW COMPARISON:  08/19/2010 FINDINGS: Heart size is normal. Mediastinal shadows are normal. The lungs are clear. No bronchial thickening. No infiltrate, mass, effusion or collapse. Pulmonary vascularity is normal. No bony abnormality. IMPRESSION: Normal chest Electronically Signed   By: Nelson Chimes M.D.   On: 09/24/2018 10:13    Recent Results (from the past 2160 hour(s))  Basic metabolic panel     Status: None   Collection Time: 09/24/18  9:45 AM  Result Value Ref Range   Sodium 137 135 - 145 mmol/L   Potassium 3.5 3.5 - 5.1 mmol/L   Chloride 104 98 - 111 mmol/L   CO2 26 22 - 32 mmol/L   Glucose, Bld 93 70 - 99 mg/dL   BUN 9 6 - 20 mg/dL   Creatinine, Ser 0.77 0.44 - 1.00 mg/dL   Calcium 9.2 8.9 - 10.3  mg/dL   GFR calc non Af Amer >60 >60 mL/min   GFR calc Af Amer >60 >60 mL/min    Comment: (NOTE) The eGFR has been calculated using the CKD EPI equation. This calculation has not been validated in all clinical situations. eGFR's persistently <60 mL/min signify possible Chronic Kidney Disease.    Anion gap 7 5 - 15    Comment: Performed at [facility] [address], [place] [address]  CBC     Status: None   Collection Time: 09/24/18  9:45 AM  Result Value Ref Range   WBC 7.3 4.0 - 10.5 K/uL   RBC 4.18 3.87 - 5.11 MIL/uL   Hemoglobin 13.0 12.0 - 15.0 g/dL   HCT 38.9 36.0 - 46.0 %   MCV 93.1 80.0 - 100.0 fL   MCH 31.1 26.0 - 34.0 pg   MCHC 33.4 30.0 - 36.0 g/dL   RDW 12.1 11.5 - 15.5 %   Platelets 174 150 - 400 K/uL   nRBC 0.0 0.0 - 0.2 %    Comment: Performed at [facility], [place] [address], [place], [place] [address]  D-dimer, quantitative     Status: None   Collection Time: 09/24/18  9:45 AM  Result Value Ref Range   D-Dimer, Quant 0.46 0.00 - 0.50 ug/mL-FEU  Comment: (NOTE) At the manufacturer cut-off of 0.50 ug/mL FEU, this assay has been documented to exclude PE with a sensitivity and negative predictive value of 97 to 99%.  At this time, this assay has not been approved by the FDA to exclude DVT/VTE. Results should be correlated with clinical presentation. Performed at [facility], [place] [address] [place], [place] [address]   I-stat troponin, ED     Status: None   Collection Time: 09/24/18  9:49 AM  Result Value Ref Range   Troponin i, poc 0.00 0.00 - 0.08 ng/mL   Comment 3            Comment: Due to the release kinetics of cTnI, a negative result within the first hours of the onset of symptoms does not rule out myocardial infarction with certainty. If myocardial infarction is still suspected, repeat the test at appropriate intervals.   I-Stat beta hCG blood, ED     Status: None   Collection Time: 09/24/18  9:49 AM  Result Value  Ref Range   I-stat hCG, quantitative <5.0 <5 mIU/mL   Comment 3            Comment:   GEST. AGE      CONC.  (mIU/mL)   <=1 WEEK        5 - 50     2 WEEKS       50 - 500     3 WEEKS       100 - 10,000     4 WEEKS     1,000 - 30,000        FEMALE AND NON-PREGNANT FEMALE:     LESS THAN 5 mIU/mL      No flowsheet data found.  Allergies  Allergen Reactions  . Amoxicillin Hives  . Hyoscyamine Other (See Comments)  . Paxil [Paroxetine Hcl]     Dizziness   . Penicillins     REACTION: hives   Social History   Tobacco Use  . Smoking status: Former Smoker    Packs/day: 0.25    Years: 6.00    Pack years: 1.50    Types: Cigarettes  . Smokeless tobacco: Never Used  Substance Use Topics  . Alcohol use: Yes    Comment: rare   Past Medical History:  Diagnosis Date  . Asthma   . Chronic headaches   . IBS (irritable bowel syndrome)   . Tonsillitis    History reviewed. No pertinent surgical history. Family History  Problem Relation Age of Onset  . Allergies Mother   . Asthma Mother   . Hypertension Mother   . Arthritis Maternal Grandmother   . Irritable bowel syndrome Maternal Grandmother   . Lung cancer Other        paternal great grandfather  . Lung cancer Other        maternal great grandfather  . Cancer Other        Blood-great grandmother   Allergies as of 09/26/2018      Reactions   Amoxicillin Hives   Hyoscyamine Other (See Comments)   Paxil [paroxetine Hcl]    Dizziness   Penicillins    REACTION: hives      Medication List        Accurate as of 09/26/18 11:36 AM. Always use your most recent med list.          dicyclomine 10 MG capsule Commonly known as:  BENTYL TAKE 1 CAPSULE BY MOUTH FOUR TIMES DAILY  BEFORE MEALS AND AT AT BEDTIME   diphenoxylate-atropine 2.5-0.025 MG tablet Commonly known as:  LOMOTIL Take 1 tablet by mouth 2 (two) times daily as needed for diarrhea or loose stools. TAKE 1 TABLET BY MOUTH FOUR TIMES DAILY AS NEEDED FOR  DIARRHEA OR LOOSE STOOLS.   drospirenone-ethinyl estradiol 3-0.02 MG tablet Commonly known as:  YAZ,GIANVI,LORYNA Take 1 tablet by mouth daily.   famotidine 40 MG tablet Commonly known as:  PEPCID Take 1 tablet (40 mg total) by mouth daily.   ibuprofen 800 MG tablet Commonly known as:  ADVIL,MOTRIN Take 1 tablet (800 mg total) by mouth every 8 (eight) hours as needed.   multivitamin with minerals tablet Take 1 tablet by mouth daily.   pantoprazole 40 MG tablet Commonly known as:  PROTONIX 1 tab po qd prn   sucralfate 1 g tablet Commonly known as:  CARAFATE Take 1 tablet (1 g total) by mouth 4 (four) times daily -  with meals and at bedtime.       All past medical history, surgical history, allergies, family history, immunizations andmedications were updated in the EMR today and reviewed under the history and medication portions of their EMR.     ROS: Negative, with the exception of above mentioned in HPI   Objective:  BP 114/75 (BP Location: Right Arm, Patient Position: Sitting, Cuff Size: Normal)   Pulse 75   Temp 98.1 F (36.7 C)   Resp 20   Ht 5' 5" (1.651 m)   Wt 119 lb (54 kg)   LMP 09/09/2018   SpO2 99%   BMI 19.80 kg/m  Body mass index is 19.8 kg/m.  Prior weight in ed of 172 is an error.  Gen: Afebrile. No acute distress. Nontoxic in appearance, well developed, well nourished. Appears mildly pale today. HENT: AT. [place]. MMM, no oral lesions.  Eyes:Pupils Equal Round Reactive to light, Extraocular movements intact,  Conjunctiva without redness, discharge or icterus. CV: RRR  Chest: CTAB Abd: Soft. Flat, moderately TTP inferior to xiphoid and upper epigastric ND. BS present. no Masses palpated. No rebound.  Skin: no rashes, purpura or petechiae.  Neuro:  Normal gait. PERLA. EOMi. Alert. Oriented x3  Psych: Normal affect, dress and demeanor. Normal speech. Normal thought content and judgment.  No exam data present No results found. No results found for  this or any previous visit (from the past 24 hour(s)).  Assessment/Plan: Suzanne Jarvis is a 27 y.o. female present for OV for  Chest discomfort/Esophageal dysphagia-odynophagia/dyspepsia - Symptoms progressing. She is now unable to eat. Sharp pain to distal esophagus with swallowing. She has heartburn in last week also. She also just finished doxycyline- but does not report a pill getting lodged. Possibly drug- induced dyspepsia vs severe reflux/dyspepsia.  - AVOID ALL NSAIDS - bland soft diet, advance slowly as tolerated.  - continue Protonix and start Pepcid (prescribed). - start Carafate (prescribed).  - GERD/dyspepsia diet and modifications education provided in AVS. - H. pylori antibody, IgG--> will  treat if +, since no prior +. - Ambulatory referral to Gastroenterology - f/u 2 weeks if not resolved or est w/ GI.    Reviewed expectations re: course of current medical issues.  Discussed self-management of symptoms.  Outlined signs and symptoms indicating need for more acute intervention.  Patient verbalized understanding and all questions were answered.  Patient received an After-Visit Summary.    Orders Placed This Encounter  Procedures  . H. pylori antibody, IgG  . Ambulatory referral to  Gastroenterology    > 25 minutes spent with patient, >50% of time spent face to face counseling   Note is dictated utilizing voice recognition software. Although note has been proof read prior to signing, occasional typographical errors still can be missed. If any questions arise, please do not hesitate to call for verification.   electronically signed by:  Howard Pouch, DO  [employer]

## 2018-09-26 NOTE — Patient Instructions (Addendum)
Continue Protonix 36m -1 hour before first meal.  Start Pepcid once daily. Start the Carafate (coater)  Before bed and right before meals . I have referred you to gastro, they will call to schedule . Follow bland diet. See below.  No NSAIDS.    Food Choices for Gastroesophageal Reflux Disease, Adult When you have gastroesophageal reflux disease (GERD), the foods you eat and your eating habits are very important. Choosing the right foods can help ease your discomfort. What guidelines do I need to follow? BUT START SOFT AND BLAND  Choose fruits, vegetables, whole grains, and low-fat dairy products.  Choose low-fat meat, fish, and poultry.  Limit fats such as oils, salad dressings, butter, nuts, and avocado.  Keep a food diary. This helps you identify foods that cause symptoms.  Avoid foods that cause symptoms. These may be different for everyone.  Eat small meals often instead of 3 large meals a day.  Eat your meals slowly, in a place where you are relaxed.  Limit fried foods.  Cook foods using methods other than frying.  Avoid drinking alcohol.  Avoid drinking large amounts of liquids with your meals.  Avoid bending over or lying down until 2-3 hours after eating. What foods are not recommended? These are some foods and drinks that may make your symptoms worse: Vegetables Tomatoes. Tomato juice. Tomato and spaghetti sauce. Chili peppers. Onion and garlic. Horseradish. Fruits Oranges, grapefruit, and lemon (fruit and juice). Meats High-fat meats, fish, and poultry. This includes hot dogs, ribs, ham, sausage, salami, and bacon. Dairy Whole milk and chocolate milk. Sour cream. Cream. Butter. Ice cream. Cream cheese. Drinks Coffee and tea. Bubbly (carbonated) drinks or energy drinks. Condiments Hot sauce. Barbecue sauce. Sweets/Desserts Chocolate and cocoa. Donuts. Peppermint and spearmint. Fats and Oils High-fat foods. This includes [place] fries and potato  chips. Other Vinegar. Strong spices. This includes black pepper, white pepper, red pepper, cayenne, curry powder, cloves, ginger, and chili powder. The items listed above may not be a complete list of foods and drinks to avoid. Contact your dietitian for more information. This information is not intended to replace advice given to you by your health care provider. Make sure you discuss any questions you have with your health care provider. Document Released: 05/01/2012 Document Revised: 04/07/2016 Document Reviewed: 09/04/2013 Elsevier Interactive Patient Education  2017 Elsevier Inc.   Indigestion Indigestion is a feeling of pain, discomfort, burning, or fullness in the upper part of your abdomen. It can come and go. It may occur frequently or rarely. Indigestion tends to occur while you are eating or right after you have finished eating. It may be worse at night and while bending over or lying down. Follow these instructions at home: Take these actions to decrease your pain or discomfort and to help avoid complications. Diet  Follow a diet as recommended by your health care provider. This may involve avoiding foods and drinks such as: ? Coffee and tea (with or without caffeine). ? Drinks that contain alcohol. ? Energy drinks and sports drinks. ? Carbonated drinks or sodas. ? Chocolate and cocoa. ? Peppermint and mint flavorings. ? Garlic and onions. ? Horseradish. ? Spicy and acidic foods, including peppers, chili powder, curry powder, vinegar, hot sauces, and barbecue sauce. ? Citrus fruit juices and citrus fruits, such as oranges, lemons, and limes. ? Tomato-based foods, such as red sauce, chili, salsa, and pizza with red sauce. ? Fried and fatty foods, such as donuts, french fries, potato chips, and high-fat dressings. ?  High-fat meats, such as hot dogs and fatty cuts of red and white meats, such as rib eye steak, sausage, ham, and bacon. ? High-fat dairy items, such as whole milk,  butter, and cream cheese.  Eat small, frequent meals instead of large meals.  Avoid drinking large amounts of liquid with your meals.  Avoid eating meals during the 2-3 hours before bedtime.  Avoid lying down right after you eat.  Do not exercise right after you eat. General instructions  Pay attention to any changes in your symptoms.  Take over-the-counter and prescription medicines only as told by your health care provider. Do not take aspirin, ibuprofen, or other NSAIDs unless your health care provider told you to do so.  Do not use any tobacco products, including cigarettes, chewing tobacco, and e-cigarettes. If you need help quitting, ask your health care provider.  Wear loose-fitting clothing. Do not wear anything tight around your waist that causes pressure on your abdomen.  Raise (elevate) the head of your bed about 6 inches (15 cm).  Try to reduce your stress, such as with yoga or meditation. If you need help reducing stress, ask your health care provider.  If you are overweight, reduce your weight to an amount that is healthy for you. Ask your health care provider for guidance about a safe weight loss goal.  Keep all follow-up visits as told by your health care provider. This is important. Contact a health care provider if:  You have new symptoms.  You have unexplained weight loss.  You have difficulty swallowing, or it hurts to swallow.  Your symptoms do not improve with treatment.  Your symptoms last for more than two days.  You have a fever.  You vomit. Get help right away if:  You have pain in your arms, neck, jaw, teeth, or back.  You feel sweaty, dizzy, or light-headed.  You faint.  You have chest pain or shortness of breath.  You cannot stop vomiting, or you vomit blood.  Your stool is bloody or black.  You have severe pain in your abdomen. This information is not intended to replace advice given to you by your health care provider. Make sure  you discuss any questions you have with your health care provider. Document Released: 12/08/2004 Document Revised: 04/07/2016 Document Reviewed: 02/25/2015 Elsevier Interactive Patient Education  [employer]2018 Elsevier Inc.

## 2018-10-05 ENCOUNTER — Encounter

## 2018-10-05 ENCOUNTER — Encounter: Payer: Self-pay | Admitting: Gastroenterology

## 2018-10-05 ENCOUNTER — Ambulatory Visit: Payer: BLUE CROSS/BLUE SHIELD | Admitting: Gastroenterology

## 2018-10-05 VITALS — BP 118/64 | HR 66 | Ht 65.0 in | Wt 119.0 lb

## 2018-10-05 DIAGNOSIS — K208 Other esophagitis: Secondary | ICD-10-CM | POA: Diagnosis not present

## 2018-10-05 DIAGNOSIS — R079 Chest pain, unspecified: Secondary | ICD-10-CM

## 2018-10-05 DIAGNOSIS — R1310 Dysphagia, unspecified: Secondary | ICD-10-CM

## 2018-10-05 DIAGNOSIS — T50905A Adverse effect of unspecified drugs, medicaments and biological substances, initial encounter: Secondary | ICD-10-CM
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
## 2018-10-05 NOTE — Patient Instructions (Addendum)
-   Take pantoprazole for 7 more days, then take every other day for 7 days then ok to stop - Ok to stop Carafate at this time - Ok to advance diet  If you are age 665 or older, your body mass index should be between 23-30. Your Body mass index is 19.8 kg/m. If this is out of the aforementioned range listed, please consider follow up with your Primary Care Provider.  If you are age 27 or younger, your body mass index should be between 19-25. Your Body mass index is 19.8 kg/m. If this is out of the aformentioned range listed, please consider follow up with your Primary Care Provider.   It was a pleasure to see you today!  Vito Cirigliano, D.O.

## 2018-10-16 ENCOUNTER — Other Ambulatory Visit: Payer: Self-pay | Admitting: Family Medicine

## 2018-10-16 MED ORDER — DICYCLOMINE HCL 10 MG PO CAPS: ORAL_CAPSULE | ORAL | 3 refills | Status: DC

## 2018-10-16 NOTE — Telephone Encounter (Signed)
Copied from CRM [phone]. Topic: Quick Communication - Rx Refill/Question >> Oct 16, 2018 11:02 AM Marylen PontoMcneil, Ja-Kwan wrote: Medication: dicyclomine (BENTYL) 10 MG capsule  Has the patient contacted their pharmacy? yes  - Patient stated pharmacy submitted request last week  Preferred Pharmacy (with phone number or street name): [id number] [place], [place] - [address] [facility] [address] [facility] [address]  [phone] (Phone)  [phone] (Fax)  Agent: Please be advised that RX refills may take up to 3 business days. We ask that you follow-up with your pharmacy.

## 2018-10-16 NOTE — Telephone Encounter (Signed)
Requested Prescriptions  Pending Prescriptions Disp Refills  . dicyclomine (BENTYL) 10 MG capsule 120 capsule 3    Sig: TAKE 1 CAPSULE BY MOUTH FOUR TIMES DAILY BEFORE MEALS AND AT AT BEDTIME     Gastroenterology:  Antispasmodic Agents Passed - 10/16/2018  1:51 PM      Passed - Last Heart Rate in normal range    Pulse Readings from Last 1 Encounters:  10/05/18 66         Passed - Valid encounter within last 12 months    Recent Outpatient Visits          2 weeks ago Dyspepsia   [facility] [name], [name], DO   3 weeks ago GERD with esophagitis   [facility] McGowen, Maryjean MornPhilip H, MD   1 month ago Acute non-recurrent maxillary sinusitis   [facility] [name], [name], DO   3 months ago Nasal septum ulceration   [facility] [name], [name], DO   7 months ago Irritable bowel syndrome with diarrhea   [facility] [name], [name], DO

## 2018-10-19 ENCOUNTER — Ambulatory Visit: Payer: BLUE CROSS/BLUE SHIELD | Admitting: Family Medicine

## 2018-10-19 ENCOUNTER — Encounter: Payer: Self-pay | Admitting: Family Medicine

## 2018-10-19 ENCOUNTER — Other Ambulatory Visit: Payer: Self-pay

## 2018-10-19 VITALS — BP 111/72 | HR 89 | Temp 98.1°F | Resp 16 | Ht 65.0 in | Wt 124.0 lb

## 2018-10-19 DIAGNOSIS — T7840XA Allergy, unspecified, initial encounter: Secondary | ICD-10-CM | POA: Diagnosis not present

## 2018-10-19 DIAGNOSIS — J0100 Acute maxillary sinusitis, unspecified: Secondary | ICD-10-CM | POA: Insufficient documentation

## 2018-10-19 MED ORDER — CEFDINIR 300 MG PO CAPS: 300.0000 mg | ORAL_CAPSULE | Freq: Two times a day (BID) | ORAL | 0 refills | Status: DC

## 2018-10-19 MED ORDER — METHYLPREDNISOLONE ACETATE 80 MG/ML IJ SUSP
80.0000 mg | Freq: Once | INTRAMUSCULAR | Status: AC
  Administered 2018-10-19: 80 mg via INTRAMUSCULAR

## 2018-10-19 MED ORDER — LEVOCETIRIZINE DIHYDROCHLORIDE 5 MG PO TABS: 5.0000 mg | ORAL_TABLET | Freq: Every evening | ORAL | 3 refills | Status: DC

## 2018-10-19 NOTE — Progress Notes (Signed)
Suzanne Jarvis , [birth year], 27 y.o., female MRN: [id number] Patient Care Team    Relationship Specialty Notifications Start End  Natalia LeatherwoodKuneff,  A, DO PCP - General Family Medicine  09/04/15     Chief Complaint  Patient presents with  . Cough    Pt States she is prone to Bronchitis  . Nasal Congestion    X's 3 days     Subjective: Pt presents for an OV with complaints of dry cough of 3 days duration.  Associated symptoms include nasal congestion, runny nose, mild sore throat , left ear fullness. She denies nausea, vomit, diarrhea, fever, chills. Pt has tried cold and flu products to ease their symptoms. She has had her flu shot this year  No flowsheet data found.  Allergies  Allergen Reactions  . Amoxicillin Hives  . Hyoscyamine Other (See Comments)  . Paxil [Paroxetine Hcl]     Dizziness   . Penicillins     REACTION: hives   Social History   Tobacco Use  . Smoking status: Former Smoker    Packs/day: 0.25    Years: 6.00    Pack years: 1.50    Types: Cigarettes  . Smokeless tobacco: Never Used  Substance Use Topics  . Alcohol use: Yes    Comment: rare   Past Medical History:  Diagnosis Date  . Asthma   . Chronic headaches   . IBS (irritable bowel syndrome)   . Tonsillitis    History reviewed. No pertinent surgical history. Family History  Problem Relation Age of Onset  . Allergies Mother   . Asthma Mother   . Hypertension Mother   . Arthritis Maternal Grandmother   . Irritable bowel syndrome Maternal Grandmother   . Lung cancer Other        paternal great grandfather  . Lung cancer Other        maternal great grandfather  . Cancer Other        Blood-great grandmother  . Colon cancer Neg Hx    Allergies as of 10/19/2018      Reactions   Amoxicillin Hives   Hyoscyamine Other (See Comments)   Paxil [paroxetine Hcl]    Dizziness   Penicillins    REACTION: hives      Medication List        Accurate as of 10/19/18 10:40 AM. Always use your  most recent med list.          dicyclomine 10 MG capsule Commonly known as:  BENTYL TAKE 1 CAPSULE BY MOUTH FOUR TIMES DAILY BEFORE MEALS AND AT AT BEDTIME   diphenoxylate-atropine 2.5-0.025 MG tablet Commonly known as:  LOMOTIL Take 1 tablet by mouth 2 (two) times daily as needed for diarrhea or loose stools. TAKE 1 TABLET BY MOUTH FOUR TIMES DAILY AS NEEDED FOR DIARRHEA OR LOOSE STOOLS.   drospirenone-ethinyl estradiol 3-0.02 MG tablet Commonly known as:  YAZ,GIANVI,LORYNA Take 1 tablet by mouth daily.   ibuprofen 800 MG tablet Commonly known as:  ADVIL,MOTRIN Take 1 tablet (800 mg total) by mouth every 8 (eight) hours as needed.   multivitamin with minerals tablet Take 1 tablet by mouth daily.       All past medical history, surgical history, allergies, family history, immunizations andmedications were updated in the EMR today and reviewed under the history and medication portions of their EMR.     ROS: Negative, with the exception of above mentioned in HPI   Objective:  BP 111/72   Pulse 89  Temp 98.1 F (36.7 C) (Oral)   Resp 16   Ht 5\' 5"  (1.651 m)   Wt 124 lb (56.2 kg)   SpO2 100%   BMI 20.63 kg/m  Body mass index is 20.63 kg/m. Gen: Afebrile. No acute distress. Nontoxic in appearance, well developed, well nourished.  HENT: AT. [place]. Bilateral TM visualized pink, fluid filled. MMM, no oral lesions. Bilateral nares with erythema, swelling and drainage. Throat without erythema or exudates. PND, cough, hoarseness present Eyes:Pupils Equal Round Reactive to light, Extraocular movements intact,  Conjunctiva without redness, discharge or icterus. Neck/lymp/endocrine: Supple,bilateral ant cervical lymphadenopathy CV: RRR Chest: CTAB, no wheeze or crackles. Good air movement, normal resp effort.  Neuro: Normal gait. PERLA. EOMi. Alert. Oriented x3  Psych: Normal affect, dress and demeanor. Normal speech. Normal thought content and judgment.  No exam data  present No results found. No results found for this or any previous visit (from the past 24 hour(s)).  Assessment/Plan: Suzanne Jarvis is a 27 y.o. female present for OV for  Acute non-recurrent maxillary sinusitis/Allergic state, initial encounter Rest, hydrate.  Continue flonase, coricidin , nettie pot or nasal saline.  omnicef prescribed, take until completed.  Start xyzal every night.  IM depo medrol provided  If cough present it can last up to 6-8 weeks.  F/U 2 weeks of not improved.     Reviewed expectations re: course of current medical issues.  Discussed self-management of symptoms.  Outlined signs and symptoms indicating need for more acute intervention.  Patient verbalized understanding and all questions were answered.  Patient received an After-Visit Summary.    No orders of the defined types were placed in this encounter.    Note is dictated utilizing voice recognition software. Although note has been proof read prior to signing, occasional typographical errors still can be missed. If any questions arise, please do not hesitate to call for verification.   electronically signed by:  Felix Pacini, DO  [facility] [place]

## 2018-10-19 NOTE — Patient Instructions (Signed)
Rest, hydrate.  Continue flonase, coricidin , nettie pot or nasal saline.  omnicef prescribed, take until completed.  Start xyzal every night.  IM depo medrol provided  If cough present it can last up to 6-8 weeks.  F/U 2 weeks of not improved.     Sinusitis, Adult Sinusitis is soreness and inflammation of your sinuses. Sinuses are hollow spaces in the bones around your face. They are located:  Around your eyes.  In the middle of your forehead.  Behind your nose.  In your cheekbones.  Your sinuses and nasal passages are lined with a stringy fluid (mucus). Mucus normally drains out of your sinuses. When your nasal tissues get inflamed or swollen, the mucus can get trapped or blocked so air cannot flow through your sinuses. This lets bacteria, viruses, and funguses grow, and that leads to infection. Follow these instructions at home: Medicines  Take, use, or apply over-the-counter and prescription medicines only as told by your doctor. These may include nasal sprays.  If you were prescribed an antibiotic medicine, take it as told by your doctor. Do not stop taking the antibiotic even if you start to feel better. Hydrate and Humidify  Drink enough water to keep your pee (urine) clear or pale yellow.  Use a cool mist humidifier to keep the humidity level in your home above 50%.  Breathe in steam for 10-15 minutes, 3-4 times a day or as told by your doctor. You can do this in the bathroom while a hot shower is running.  Try not to spend time in cool or dry air. Rest  Rest as much as possible.  Sleep with your head raised (elevated).  Make sure to get enough sleep each night. General instructions  Put a warm, moist washcloth on your face 3-4 times a day or as told by your doctor. This will help with discomfort.  Wash your hands often with soap and water. If there is no soap and water, use hand sanitizer.  Do not smoke. Avoid being around people who are smoking (secondhand  smoke).  Keep all follow-up visits as told by your doctor. This is important. Contact a doctor if:  You have a fever.  Your symptoms get worse.  Your symptoms do not get better within 10 days. Get help right away if:  You have a very bad headache.  You cannot stop throwing up (vomiting).  You have pain or swelling around your face or eyes.  You have trouble seeing.  You feel confused.  Your neck is stiff.  You have trouble breathing. This information is not intended to replace advice given to you by your health care provider. Make sure you discuss any questions you have with your health care provider. Document Released: 04/18/2008 Document Revised: 06/26/2016 Document Reviewed: 08/26/2015 Elsevier Interactive Patient Education  [employer]2018 [employer]

## 2018-10-31 ENCOUNTER — Encounter: Payer: Self-pay | Admitting: Family Medicine

## 2018-10-31 ENCOUNTER — Ambulatory Visit: Payer: BLUE CROSS/BLUE SHIELD | Admitting: Family Medicine

## 2018-10-31 VITALS — BP 111/63 | HR 85 | Temp 98.7°F | Resp 16 | Ht 65.0 in | Wt 119.0 lb

## 2018-10-31 DIAGNOSIS — K580 Irritable bowel syndrome with diarrhea: Secondary | ICD-10-CM

## 2018-10-31 MED ORDER — DICYCLOMINE HCL 10 MG PO CAPS: ORAL_CAPSULE | ORAL | 11 refills | Status: DC

## 2018-10-31 MED ORDER — DIPHENOXYLATE-ATROPINE 2.5-0.025 MG PO TABS: 1.0000 | ORAL_TABLET | Freq: Two times a day (BID) | ORAL | 3 refills | Status: DC | PRN

## 2018-10-31 NOTE — Patient Instructions (Signed)
I have refilled your meds for a year.  I hope you are able to stay with us.   Happy holidays.   Please help us help you:  We are honored you have chosen [facility] for your [facility]. Below you will find basic instructions that you may need to access in the future. Please help us help you by reading the instructions, which cover many of the frequent questions we experience.   Prescription refills and request:  -In order to allow more efficient response time, please call your pharmacy for all refills. They will forward the request electronically to us. This allows for the quickest possible response. Request left on a nurse line can take longer to refill, since these are checked as time allows between office patients and other phone calls.  - refill request can take up to 3-5 working days to complete.  - If request is sent electronically and request is appropiate, it is usually completed in 1-2 business days.  - all patients will need to be seen routinely for all chronic medical conditions requiring prescription medications (see follow-up below). If you are overdue for follow up on your condition, you will be asked to make an appointment and we will call in enough medication to cover you until your appointment (up to 30 days).  - all controlled substances will require a face to face visit to request/refill.  - if you desire your prescriptions to go through a new pharmacy, and have an active script at original pharmacy, you will need to call your pharmacy and have scripts transferred to new pharmacy. This is completed between the pharmacy locations and not by your provider.    Results: If any images or labs were ordered, it can take up to 1 week to get results depending on the test ordered and the lab/facility running and resulting the test. - Normal or stable results, which do not need further discussion, may be released to your mychart immediately with attached note to you. A call may  not be generated for normal results. Please make certain to sign up for mychart. If you have questions on how to activate your mychart you can call the front office.  - If your results need further discussion, our office will attempt to contact you via phone, and if unable to reach you after 2 attempts, we will release your abnormal result to your mychart with instructions.  - All results will be automatically released in mychart after 1 week.  - Your provider will provide you with explanation and instruction on all relevant material in your results. Please keep in mind, results and labs may appear confusing or abnormal to the untrained eye, but it does not mean they are actually abnormal for you personally. If you have any questions about your results that are not covered, or you desire more detailed explanation than what was provided, you should make an appointment with your provider to do so.   Our office handles many outgoing and incoming calls daily. If we have not contacted you within 1 week about your results, please check your mychart to see if there is a message first and if not, then contact our office.  In helping with this matter, you help decrease call volume, and therefore allow us to be able to respond to patients needs more efficiently.   Acute office visits (sick visit):  An acute visit is intended for a new problem and are scheduled in shorter time slots to  allow schedule openings for patients with new problems. This is the appropriate visit to discuss a new problem. Problems will not be addressed by phone call or Echart message. Appointment is needed if requesting treatment. In order to provide you with excellent quality medical care with proper time for you to explain your problem, have an exam and receive treatment with instructions, these appointments should be limited to one new problem per visit. If you experience a new problem, in which you desire to be addressed, please make an  acute office visit, we save openings on the schedule to accommodate you. Please do not save your new problem for any other type of visit, let us take care of it properly and quickly for you.   Follow up visits:  Depending on your condition(s) your provider will need to see you routinely in order to provide you with quality care and prescribe medication(s). Most chronic conditions (Example: hypertension, Diabetes, depression/anxiety... etc), require visits a couple times a year. Your provider will instruct you on proper follow up for your personal medical conditions and history. Please make certain to make follow up appointments for your condition as instructed. Failing to do so could result in lapse in your medication treatment/refills. If you request a refill, and are overdue to be seen on a condition, we will always provide you with a 30 day script (once) to allow you time to schedule.    Medicare wellness (well visit): - we have a wonderful Nurse Maudie Mercury), that will meet with you and provide you will yearly medicare wellness visits. These visits should occur yearly (can not be scheduled less than 1 calendar year apart) and cover preventive health, immunizations, advance directives and screenings you are entitled to yearly through your medicare benefits. Do not miss out on your entitled benefits, this is when medicare will pay for these benefits to be ordered for you.  These are strongly encouraged by your provider and is the appropriate type of visit to make certain you are up to date with all preventive health benefits. If you have not had your medicare wellness exam in the last 12 months, please make certain to schedule one by calling the office and schedule your medicare wellness with Maudie Mercury as soon as possible.   Yearly physical (well visit):  - Adults are recommended to be seen yearly for physicals. Check with your insurance and date of your last physical, most insurances require one calendar year  between physicals. Physicals include all preventive health topics, screenings, medical exam and labs that are appropriate for gender/age and history. You may have fasting labs needed at this visit. This is a well visit (not a sick visit), new problems should not be covered during this visit (see acute visit).  - Pediatric patients are seen more frequently when they are younger. Your provider will advise you on well child visit timing that is appropriate for your their age. - This is not a medicare wellness visit. Medicare wellness exams do not have an exam portion to the visit. Some medicare companies allow for a physical, some do not allow a yearly physical. If your medicare allows a yearly physical you can schedule the medicare wellness with our nurse Maudie Mercury and have your physical with your provider after, on the same day. Please check with insurance for your full benefits.   Late Policy/No Shows:  - all new patients should arrive 15-30 minutes earlier than appointment to allow [place] time  to  obtain all personal demographics,  insurance information and for you to complete office paperwork. - All established patients should arrive 10-15 minutes earlier than appointment time to update all information and be checked in .  - In our best efforts to run on time, if you are late for your appointment you will be asked to either reschedule or if able, we will work you back into the schedule. There will be a wait time to work you back in the schedule,  depending on availability.  - If you are unable to make it to your appointment as scheduled, please call 24 hours ahead of time to allow [place] to fill the time slot with someone else who needs to be seen. If you do not cancel your appointment ahead of time, you may be charged a no show fee.

## 2018-10-31 NOTE — Progress Notes (Signed)
Patient ID: Suzanne Jarvis, female   DOB: 1991-09-17, 27 y.o.   MRN: [id number]   Subjective:    Patient ID: Suzanne Jarvis, female    DOB: [birth year], 27 y.o.   MRN: [id number]  HPI IBS: pt is doing very well on medication regimen. She is taking the lomotil 2 x a day sometimes. Continues to take bentyl about 2-3 times a day. Weight stable. Feels good on med.  Prior note:  Patient presents for follow-up on anxiety and IBS today.She states she is doing really well. She is not taking the amitriptyline or Zoloft. She states she had not felt like she needed it after she ended her relationship with her prior boyfriend. The ex-boyfriend did OD on heroin this summer. She reports she is doing ok. She is in a good relationship with someone else and her family was supportive. She is still in contact with the ex's family, which has also been helpful. She continues to take the bentyl and lomotil as needed. She usually needs it 1-2x a day each.   Past Medical History:  Diagnosis Date  . Asthma   . Chronic headaches   . IBS (irritable bowel syndrome)   . Tonsillitis    Allergies  Allergen Reactions  . Amoxicillin Hives  . Hyoscyamine Other (See Comments)  . Paxil [Paroxetine Hcl]     Dizziness   . Penicillins     REACTION: hives   Social History   Socioeconomic History  . Marital status: Divorced    Spouse name: Not on file  . Number of children: 0  . Years of education: Not on file  . Highest education level: Not on file  Occupational History  . Occupation: Associate Professor  Social Needs  . Financial resource strain: Not on file  . Food insecurity:    Worry: Not on file    Inability: Not on file  . Transportation needs:    Medical: Not on file    Non-medical: Not on file  Tobacco Use  . Smoking status: Former Smoker    Packs/day: 0.25    Years: 6.00    Pack years: 1.50    Types: Cigarettes  . Smokeless tobacco: Never Used  Substance and Sexual Activity  . Alcohol use: Yes   Comment: rare  . Drug use: No  . Sexual activity: Not on file  Lifestyle  . Physical activity:    Days per week: Not on file    Minutes per session: Not on file  . Stress: Not on file  Relationships  . Social connections:    Talks on phone: Not on file    Gets together: Not on file    Attends religious service: Not on file    Active member of club or organization: Not on file    Attends meetings of clubs or organizations: Not on file    Relationship status: Not on file  . Intimate partner violence:    Fear of current or ex partner: Not on file    Emotionally abused: Not on file    Physically abused: Not on file    Forced sexual activity: Not on file  Other Topics Concern  . Not on file  Social History Narrative  . Not on file    Review of Systems Negative, with the exception of above mentioned in HPI     Objective:   Physical Exam BP 111/63 (BP Location: Left Arm, Patient Position: Sitting, Cuff Size: Normal)   Pulse 85  Temp 98.7 F (37.1 C) (Oral)   Resp 16   Ht 5\' 5"  (1.651 m)   Wt 119 lb (54 kg)   SpO2 100%   BMI 19.80 kg/m  Gen: Afebrile. No acute distress. Nontoxic in appearance. Very pleasant female.  HENT: AT. [place]. MMM.  Eyes:Pupils Equal Round Reactive to light, Extraocular movements intact,  Conjunctiva without redness, discharge or icterus. CV: RRR Chest: CTAB, no wheeze or crackles Abd: Soft. NTND. BS hyperactive.  Neuro:  Normal gait. PERLA. EOMi. Alert. Oriented x3  Assessment & Plan:  Suzanne Jarvis is a 27 y.o. female present for IBS follow up.  IBS (irritable bowel syndrome) - Stable today. Doing well on medication regimen.  - Continue dicyclomine (BENTYL) - Continue diphenoxylate-atropine (LOMOTIL) wean if able.  - refills provided for a year.  - F/U 1 year as long as doing ok. At yearly physical.    > 15 minutes spent with patient, >50% of time spent face to face  Electronically Signed by: Felix Pacinienee Drago Hammonds, DO [facility]-  OR

## 2018-11-08 ENCOUNTER — Ambulatory Visit: Payer: Self-pay

## 2018-11-08 ENCOUNTER — Telehealth: Payer: Self-pay | Admitting: Family Medicine

## 2018-11-08 NOTE — Telephone Encounter (Signed)
Pharmacy noified on directions.

## 2018-11-08 NOTE — Telephone Encounter (Signed)
Incoming call from Pharmacy, inquiring about Lomotil Rx.  Telephone call to Flow coordinator.  Flow coordinator states she will pass on to clinic. Will notify [facility] .

## 2018-11-08 NOTE — Telephone Encounter (Signed)
Pharmacist, Lelon MastSamantha, calling for clarification on Lomitil rx. It reads:  Sig: Take 1 tablet by mouth 2 (two) times daily as needed for diarrhea or loose stools. TAKE 1 TABLET BY MOUTH FOUR TIMES DAILY AS NEEDED FOR DIARRHEA OR LOOSE STOOLS.  Per DR Blenda BridegroomKuneffs last OV note pt should attempt to wean from this medication. Please call pharmacy with clarification to be Take 1 tablet by mouth 2 (two) times daily as needed for diarrhea or loose stools not 4 times a day.

## 2019-01-13 LAB — HM COLONOSCOPY

## 2019-03-26 DIAGNOSIS — R87610 Atypical squamous cells of undetermined significance on cytologic smear of cervix (ASC-US): Secondary | ICD-10-CM | POA: Insufficient documentation

## 2019-03-26 HISTORY — DX: Atypical squamous cells of undetermined significance on cytologic smear of cervix (ASC-US): R87.610

## 2019-10-17 ENCOUNTER — Other Ambulatory Visit: Payer: Self-pay

## 2019-10-17 DIAGNOSIS — Z20822 Contact with and (suspected) exposure to covid-19: Secondary | ICD-10-CM

## 2019-10-19 LAB — NOVEL CORONAVIRUS, NAA: SARS-CoV-2, NAA: NOT DETECTED

## 2019-11-02 ENCOUNTER — Other Ambulatory Visit: Payer: Self-pay | Admitting: Family Medicine

## 2019-11-03 NOTE — Telephone Encounter (Signed)
Please call patient. I have received refill request for patient's Bentyl.  Patient has not been seen in 1 year will need appointment before refills.  She may no longer be established here.  Last time I saw her, she stated due to insurance she was going to need to transfer.  Is she still a patient of mine, please schedule her.  If she is not please remove me as the PCP in the system and encouraged her to send the referral to her new PCP.  Thanks

## 2019-11-04 ENCOUNTER — Other Ambulatory Visit: Payer: Self-pay | Admitting: Family Medicine

## 2019-11-04 ENCOUNTER — Telehealth: Payer: Self-pay

## 2019-11-04 MED ORDER — DICYCLOMINE HCL 10 MG PO CAPS: ORAL_CAPSULE | ORAL | 0 refills | Status: DC

## 2019-11-04 NOTE — Telephone Encounter (Signed)
Patient has changed her insurance back to a plan that our office accepts. Her new insurance goes in to effect 11/15/19. She has made an appointment. With the holidays being here the patient needs Rx dicyclomine (BENTYL) 10 MG capsule. Please send to [facility]

## 2019-11-04 NOTE — Telephone Encounter (Signed)
Per Dr Raliegh Ip can send #[phone] to last until patients appt. Sent to [facility].

## 2019-11-04 NOTE — Telephone Encounter (Signed)
See other phone note- Pt has appt and short supply was sent to pharmacy

## 2019-11-20 ENCOUNTER — Ambulatory Visit (INDEPENDENT_AMBULATORY_CARE_PROVIDER_SITE_OTHER): Payer: 59 | Admitting: Family Medicine

## 2019-11-20 ENCOUNTER — Encounter: Payer: Self-pay | Admitting: Family Medicine

## 2019-11-20 ENCOUNTER — Other Ambulatory Visit: Payer: Self-pay

## 2019-11-20 VITALS — Temp 98.3°F | Ht 65.0 in

## 2019-11-20 DIAGNOSIS — K580 Irritable bowel syndrome with diarrhea: Secondary | ICD-10-CM | POA: Diagnosis not present

## 2019-11-20 DIAGNOSIS — J302 Other seasonal allergic rhinitis: Secondary | ICD-10-CM | POA: Diagnosis not present

## 2019-11-20 MED ORDER — DIPHENOXYLATE-ATROPINE 2.5-0.025 MG PO TABS: 1.0000 | ORAL_TABLET | Freq: Two times a day (BID) | ORAL | 3 refills | Status: DC | PRN

## 2019-11-20 MED ORDER — DICYCLOMINE HCL 10 MG PO CAPS: ORAL_CAPSULE | ORAL | 3 refills | Status: DC

## 2019-11-20 MED ORDER — LEVOCETIRIZINE DIHYDROCHLORIDE 5 MG PO TABS: 5.0000 mg | ORAL_TABLET | Freq: Every evening | ORAL | 3 refills | Status: DC

## 2019-11-20 NOTE — Progress Notes (Signed)
VIRTUAL VISIT VIA VIDEO  I connected with Suzanne Jarvis on 11/20/19 at  9:30 AM EST by a video enabled telemedicine application and verified that I am speaking with the correct person using two identifiers. Location patient: Home Location provider: [facility], Office Persons participating in the virtual visit: Patient, Dr. Claiborne Billings and R.Baker, LPN  I discussed the limitations of evaluation and management by telemedicine and the availability of in person appointments. The patient expressed understanding and agreed to proceed.   SUBJECTIVE Chief Complaint  Patient presents with  . Allergies    Pt needs refill on medication  . Irritable Bowel Syndrome    HPI: Suzanne Jarvis is a 29 y.o. female present for f/u [facility].   IBS: pt is doing very well on medication regimen. She is taking the lomotil 2 x a day sometimes. Continues to take bentyl about 2-3 times a day. Weight stable. Feels good on med.  Prior note:  Patient presents for follow-up on anxiety and IBS today.She states she is doing really well. She is not taking the amitriptyline or Zoloft. She states she had not felt like she needed it after she ended her relationship with her prior boyfriend. The ex-boyfriend did OD on heroin this summer. She reports she is doing ok. She is in a good relationship with someone else and her family was supportive. She is still in contact with the ex's family, which has also been helpful. She continues to take the bentyl and lomotil as needed. She usually needs it 1-2x a day each.  She does need refills today.  Allergies: Patient reports compliance with Xyzal 5 mg nightly.  She reports this medication controls her allergies well.  She does need refills today.  ROS: See pertinent positives and negatives per HPI.  Patient Active Problem List   Diagnosis Date Noted  . ASCUS with positive high risk HPV cervical 03/26/2019  . Allergies 10/19/2018  . IBS (irritable bowel syndrome) 09/30/2015   . Erythema migrans (Lyme disease) 07/15/2015  . Vitamin B 12 deficiency 12/10/2014    Social History   Tobacco Use  . Smoking status: Former Smoker    Packs/day: 0.25    Years: 6.00    Pack years: 1.50    Types: Cigarettes  . Smokeless tobacco: Never Used  Substance Use Topics  . Alcohol use: Yes    Comment: rare    Current Outpatient Medications:  .  ascorbic acid (VITAMIN C) 500 MG tablet, Take by mouth., Disp: , Rfl:  .  Cyanocobalamin (B-12) 1000 MCG TABS, Take 2 tablets by mouth daily., Disp: , Rfl:  .  dicyclomine (BENTYL) 10 MG capsule, TAKE 1 CAPSULE BY MOUTH FOUR TIMES DAILY BEFORE MEALS AND AT AT BEDTIME, Disp: 120 capsule, Rfl: 3 .  diphenoxylate-atropine (LOMOTIL) 2.5-0.025 MG tablet, Take 1 tablet by mouth 2 (two) times daily as needed for diarrhea or loose stools. TAKE 1 TABLET BY MOUTH FOUR TIMES DAILY AS NEEDED FOR DIARRHEA OR LOOSE STOOLS., Disp: 180 tablet, Rfl: 3 .  drospirenone-ethinyl estradiol (YAZ,GIANVI,LORYNA) 3-0.02 MG tablet, Take 1 tablet by mouth daily.  , Disp: , Rfl:  .  levocetirizine (XYZAL) 5 MG tablet, Take 1 tablet (5 mg total) by mouth every evening., Disp: 90 tablet, Rfl: 3 .  Magnesium 100 MG TABS, Take by mouth., Disp: , Rfl:  .  Multiple Vitamins-Minerals (MULTIVITAMIN WITH MINERALS) tablet, Take 1 tablet by mouth daily., Disp: , Rfl:  .  Saccharomyces boulardii (PROBIOTIC) 250 MG CAPS,  Take by mouth., Disp: , Rfl:  .  Vitamin D, Cholecalciferol, 25 MCG (1000 UT) CAPS, Take by mouth., Disp: , Rfl:   Allergies  Allergen Reactions  . Amoxicillin Hives  . Hyoscyamine Other (See Comments)  . Paxil [Paroxetine Hcl]     Dizziness   . Penicillins     REACTION: hives    OBJECTIVE: Temp 98.3 F (36.8 C) (Temporal)   Ht 5\' 5"  (1.651 m)   LMP 11/06/2019 (Exact Date)   BMI 19.80 kg/m  Gen: No acute distress. Nontoxic in appearance.  HENT: AT. [place].  MMM.  Eyes:Pupils Equal Round Reactive to light, Extraocular movements intact,  Conjunctiva  without redness, discharge or icterus. Chest: Cough or shortness of breath not present  Neuro:  Alert. Oriented x3  Psych: Normal affect, dress and demeanor. Normal speech. Normal thought content and judgment.  ASSESSMENT AND PLAN: Suzanne Jarvis is a 29 y.o. female present for  Seasonal allergies Stable.  Continue Xyzal nightly.  Refills provided for today.  Irritable bowel syndrome with diarrhea -Patient reports her condition has been stable as long as she is able to take her medications. -Continue Bentyl and Lomotil as needed.  Refills provided today. -Yearly follow-up unless needed sooner in addition to CPE.  Patient was encouraged to make CPE appointment within the next 3 to 6 months at her convenience.     No orders of the defined types were placed in this encounter.  Meds ordered this encounter  Medications  . levocetirizine (XYZAL) 5 MG tablet    Sig: Take 1 tablet (5 mg total) by mouth every evening.    Dispense:  90 tablet    Refill:  3  . diphenoxylate-atropine (LOMOTIL) 2.5-0.025 MG tablet    Sig: Take 1 tablet by mouth 2 (two) times daily as needed for diarrhea or loose stools. TAKE 1 TABLET BY MOUTH FOUR TIMES DAILY AS NEEDED FOR DIARRHEA OR LOOSE STOOLS.    Dispense:  180 tablet    Refill:  3  . dicyclomine (BENTYL) 10 MG capsule    Sig: TAKE 1 CAPSULE BY MOUTH FOUR TIMES DAILY BEFORE MEALS AND AT AT BEDTIME    Dispense:  120 capsule    Refill:  [place], DO 11/20/2019

## 2019-11-22 IMAGING — DX DG CHEST 2V
2 series · 2 of 2 positions shown · non-contrast
Comparison: 08/19/2010

CLINICAL DATA: Chest pain beginning today

EXAM:
CHEST - 2 VIEW

[w chest lat (1 of 2)]
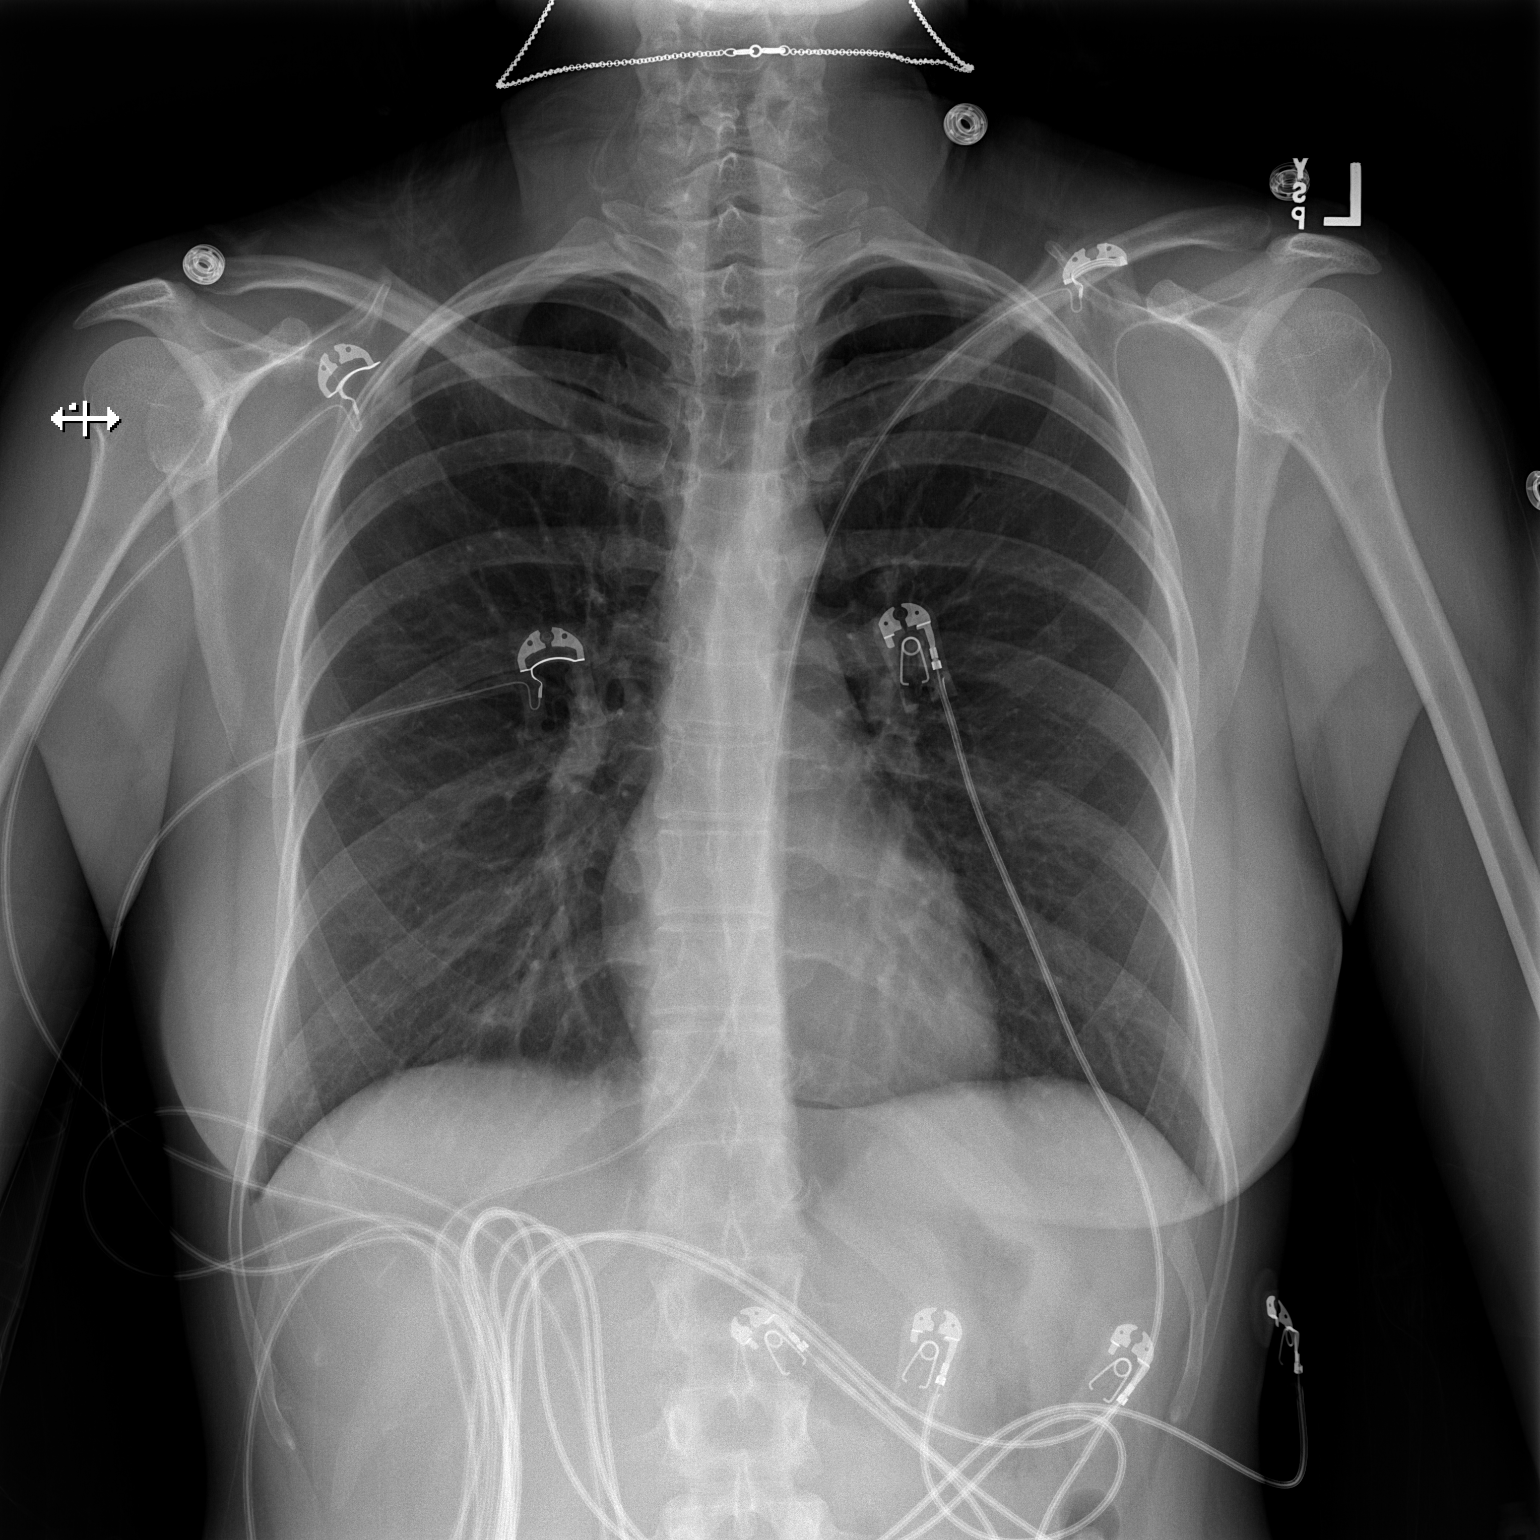

[w chest lat (2 of 2)]
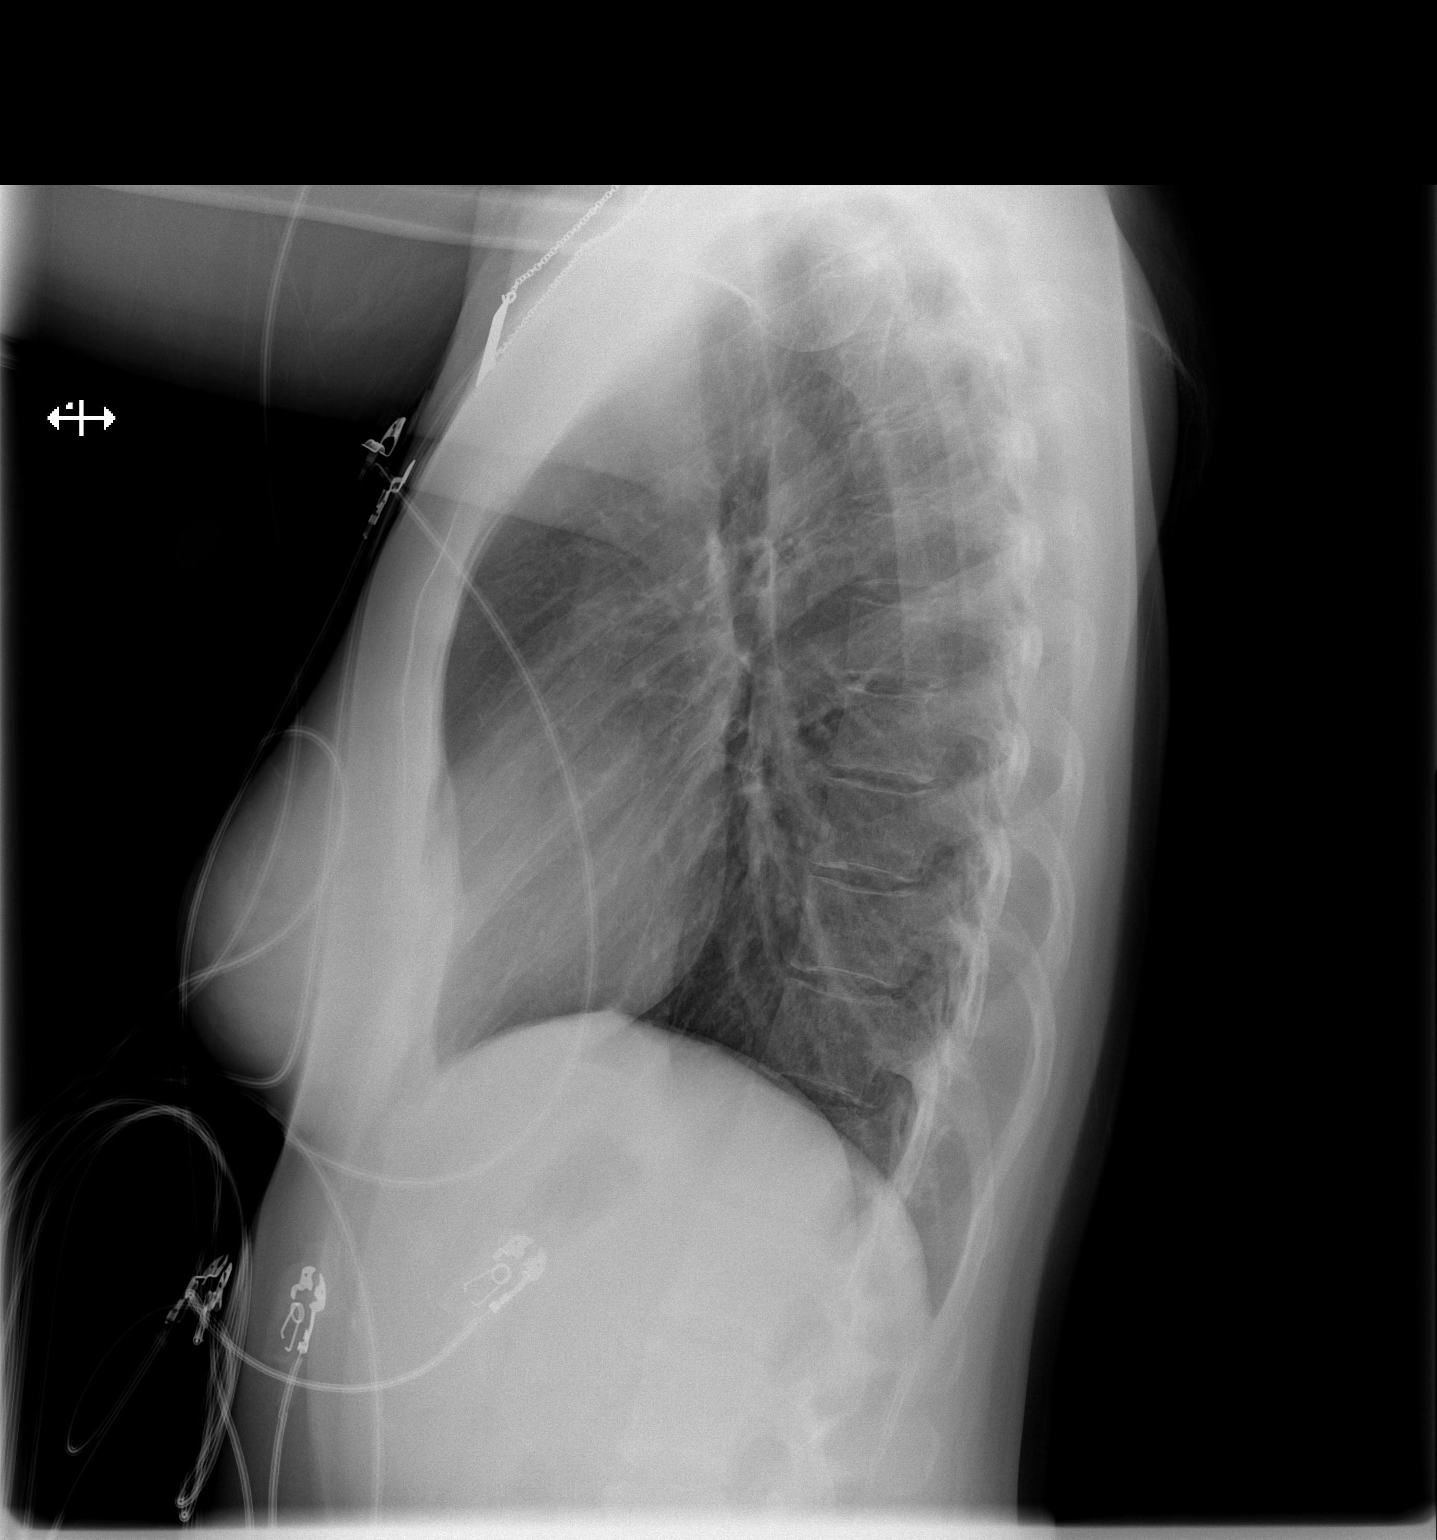

[2 of 2 positions shown; findings below may reference images not displayed]

FINDINGS: Heart size is normal. Mediastinal shadows are normal. The lungs are
clear. No bronchial thickening. No infiltrate, mass, effusion or
collapse. Pulmonary vascularity is normal. No bony abnormality.
IMPRESSION: Normal chest

## 2019-12-23 LAB — RESULTS CONSOLE HPV: CHL HPV: POSITIVE

## 2019-12-23 LAB — HM PAP SMEAR

## 2019-12-25 ENCOUNTER — Encounter: Payer: Self-pay | Admitting: Family Medicine

## 2020-01-29 ENCOUNTER — Other Ambulatory Visit: Payer: Self-pay

## 2020-01-29 ENCOUNTER — Encounter: Payer: Self-pay | Admitting: Family Medicine

## 2020-01-29 ENCOUNTER — Ambulatory Visit (INDEPENDENT_AMBULATORY_CARE_PROVIDER_SITE_OTHER): Payer: 59 | Admitting: Family Medicine

## 2020-01-29 VITALS — BP 120/81 | HR 86 | Temp 98.1°F | Resp 18 | Ht 64.5 in | Wt 133.5 lb

## 2020-01-29 DIAGNOSIS — T7840XD Allergy, unspecified, subsequent encounter: Secondary | ICD-10-CM | POA: Diagnosis not present

## 2020-01-29 DIAGNOSIS — K580 Irritable bowel syndrome with diarrhea: Secondary | ICD-10-CM | POA: Diagnosis not present

## 2020-01-29 DIAGNOSIS — Z0000 Encounter for general adult medical examination without abnormal findings: Secondary | ICD-10-CM

## 2020-01-29 DIAGNOSIS — Z130 Encounter for screening for diseases of the blood and blood-forming organs and certain disorders involving the immune mechanism: Secondary | ICD-10-CM | POA: Diagnosis not present

## 2020-01-29 DIAGNOSIS — Z131 Encounter for screening for diabetes mellitus: Secondary | ICD-10-CM | POA: Diagnosis not present

## 2020-01-29 DIAGNOSIS — Z23 Encounter for immunization: Secondary | ICD-10-CM

## 2020-01-29 DIAGNOSIS — E559 Vitamin D deficiency, unspecified: Secondary | ICD-10-CM

## 2020-01-29 DIAGNOSIS — Z793 Long term (current) use of hormonal contraceptives: Secondary | ICD-10-CM

## 2020-01-29 MED ORDER — MONTELUKAST SODIUM 10 MG PO TABS: 10.0000 mg | ORAL_TABLET | Freq: Every day | ORAL | 3 refills | Status: DC

## 2020-01-29 MED ORDER — DICYCLOMINE HCL 10 MG PO CAPS: ORAL_CAPSULE | ORAL | 3 refills | Status: DC

## 2020-01-29 NOTE — Addendum Note (Signed)
Addended by: Daryel November L on: 01/29/2020 01:47 PM   Modules accepted: Orders

## 2020-01-29 NOTE — Progress Notes (Signed)
This visit occurred during the SARS-CoV-2 public health emergency.  Safety protocols were in place, including screening questions prior to the visit, additional usage of staff PPE, and extensive cleaning of exam room while observing appropriate contact time as indicated for disinfecting solutions.    Patient ID: Suzanne Jarvis, female  DOB: 1990/12/12, 29 y.o.   MRN: [id number] Patient Care Team    Relationship Specialty Notifications Start End  [name], DO PCP - General Family Medicine  09/04/15   [name], NP Nurse Practitioner Obstetrics and Gynecology  01/29/20     Chief Complaint  Patient presents with  . Annual Exam    Not fasting. Pap smear 12/23/19, GYN.     Subjective:  Suzanne Jarvis is a 29 y.o.  Female  present for CPE. All past medical history, surgical history, allergies, family history, immunizations, medications and social history were updated in the electronic medical record today. All recent labs, ED visits and hospitalizations within the last year were reviewed.  Health maintenance:  Colonoscopy: routine screen at 57 Mammogram: routine screen 40 Cervical cancer screening: last pap: 12/2019. [facility], Kenton Kingfisher, NP Immunizations: tdap due- provided today 01/29/2020, Influenza UTD 2020 (encouraged yearly). covid vaccine counseled Infectious disease screening: HIV completed w/ gyn DEXA: routine screen Assistive device: none Oxygen JAS:NKNL Patient has a Dental home. Hospitalizations/ED visits: reviewed   IBS: pt is doing very well on medication regimen. She is taking the lomotil 2 x a day sometimes. Continues to take bentyl about 2-3 times a day. Weight stable. Feels good on med.  Prior note:  Patient presents for follow-up on anxiety and IBS today.She states she is doing really well. She is not taking the amitriptyline or Zoloft. She states she had not felt like she needed it after she ended her relationship with her prior boyfriend. The  ex-boyfriend did OD on heroin this summer. She reports she is doing ok. She is in a good relationship with someone else and her family was supportive. She is still in contact with the ex's family, which has also been helpful. She continues to take the bentyl and lomotil as needed. She usually needs it 1-2x a day each.  She does need refills today.  Allergies: Patient reports compliance with Xyzal 5 mg nightly.  She reports this medication controls her allergies well.  She does need refills today. Depression screen [facility] 2/9 01/29/2020 11/20/2019  Decreased Interest 0 0  Down, Depressed, Hopeless 0 0  PHQ - 2 Score 0 0   GAD 7 : Generalized Anxiety Score 09/30/2015  Nervous, Anxious, on Edge 2  Control/stop worrying 3  Worry too much - different things 2  Trouble relaxing 1  Restless 0  Easily annoyed or irritable 3  Afraid - awful might happen 2  Total GAD 7 Score 13  Anxiety Difficulty Very difficult    Immunization History  Administered Date(s) Administered  . Influenza Split 11/18/2011, 11/26/2012  . Influenza,inj,Quad PF,6+ Mos 08/08/2013, 09/05/2014, 08/14/2015, 08/24/2016, 08/26/2017, 08/29/2018, 09/18/2019  . Td 06/16/2008     Past Medical History:  Diagnosis Date  . Asthma   . Chronic headaches   . IBS (irritable bowel syndrome)   . Tonsillitis    Allergies  Allergen Reactions  . Amoxicillin Hives  . Hyoscyamine Other (See Comments)  . Paxil [Paroxetine Hcl]     Dizziness   . Penicillins     REACTION: hives   History reviewed. No pertinent surgical history. Family History  Problem  Relation Age of Onset  . Allergies Mother   . Asthma Mother   . Hypertension Mother   . Arthritis Maternal Grandmother   . Irritable bowel syndrome Maternal Grandmother   . Lung cancer Other        paternal great grandfather  . Lung cancer Other        maternal great grandfather  . Cancer Other        Blood-great grandmother  . Colon cancer Neg Hx    Social History    Social History Narrative  . Not on file    Allergies as of 01/29/2020      Reactions   Amoxicillin Hives   Hyoscyamine Other (See Comments)   Paxil [paroxetine Hcl]    Dizziness   Penicillins    REACTION: hives      Medication List       Accurate as of January 29, 2020 11:34 AM. If you have any questions, ask your nurse or doctor.        ascorbic acid 500 MG tablet Commonly known as: VITAMIN C Take by mouth.   B-12 1000 MCG Tabs Take 2 tablets by mouth daily.   Biotin 10000 MCG Tabs Take by mouth. 5,000 MCG daily   dicyclomine 10 MG capsule Commonly known as: BENTYL TAKE 1 CAPSULE BY MOUTH FOUR TIMES DAILY BEFORE MEALS AND AT AT BEDTIME   diphenoxylate-atropine 2.5-0.025 MG tablet Commonly known as: LOMOTIL Take 1 tablet by mouth 2 (two) times daily as needed for diarrhea or loose stools. TAKE 1 TABLET BY MOUTH FOUR TIMES DAILY AS NEEDED FOR DIARRHEA OR LOOSE STOOLS.   drospirenone-ethinyl estradiol 3-0.02 MG tablet Commonly known as: YAZ Take 1 tablet by mouth daily.   levocetirizine 5 MG tablet Commonly known as: Xyzal Take 1 tablet (5 mg total) by mouth every evening.   Magnesium 100 MG Tabs Take by mouth.   montelukast 10 MG tablet Commonly known as: SINGULAIR Take 1 tablet (10 mg total) by mouth at bedtime. Started by: Howard Pouch, DO   multivitamin with minerals tablet Take 1 tablet by mouth daily.   Probiotic 250 MG Caps Take by mouth.   Vitamin D (Cholecalciferol) 25 MCG (1000 UT) Caps Take by mouth.       All past medical history, surgical history, allergies, family history, immunizations andmedications were updated in the EMR today and reviewed under the history and medication portions of their EMR.     Recent Results (from the past 2160 hour(s))  HM PAP SMEAR     Status: None   Collection Time: 12/23/19 12:00 AM  Result Value Ref Range   HM Pap smear Pap completed      ROS: 14 pt review of systems performed and negative (unless  mentioned in an HPI)  Objective: BP 120/81 (BP Location: Right Arm, Patient Position: Sitting, Cuff Size: Normal)   Pulse 86   Temp 98.1 F (36.7 C) (Temporal)   Resp 18   Ht 5' 4.5" (1.638 m)   Wt 133 lb 8 oz (60.6 kg)   LMP 01/25/2020 (Exact Date)   SpO2 98%   BMI 22.56 kg/m  Gen: Afebrile. No acute distress. Nontoxic in appearance, well-developed, well-nourished,  Pleasant caucasian female.  HENT: AT. [place]. Bilateral TM visualized and normal in appearance, normal external auditory canal. MMM, no oral lesions, adequate dentition. Bilateral nares within normal limits. Throat without erythema, ulcerations or exudates. no Cough on exam, no hoarseness on exam. Eyes:Pupils Equal Round Reactive to light, Extraocular  movements intact,  Conjunctiva without redness, discharge or icterus. Neck/lymp/endocrine: Supple,no lymphadenopathy, no thyromegaly CV: RRR no murmur, no edema, +2/4 P posterior tibialis pulses. no carotid bruits. No JVD. Chest: CTAB, no wheeze, rhonchi or crackles. normal Respiratory effort. good Air movement. Abd: Soft. flat. NTND. BS present. no Masses palpated. No hepatosplenomegaly. No rebound tenderness or guarding. Skin: no rashes, purpura or petechiae. Warm and well-perfused. Skin intact. Neuro/Msk:  Normal gait. PERLA. EOMi. Alert. Oriented x3.  Cranial nerves II through XII intact. Muscle strength 5/5 upper/lower extremity. DTRs equal bilaterally. Psych: Normal affect, dress and demeanor. Normal speech. Normal thought content and judgment.   No exam data present  Assessment/plan: Suzanne Jarvis is a 29 y.o. female present for CPE Seasonal allergies Not as well controlled.  Continue  Xyzal flonase nasal spray daily.  - start Singulair QHS Irritable bowel syndrome with diarrhea Stable.  - TSH - cmp -Continue Bentyl Continue  Lomotil as needed.   Diabetes mellitus screening - Hemoglobin A1c Screening for deficiency anemia - CBC Long term current use of  hormonal contraceptive - Lipid panel Encounter for preventive health examination Patient was encouraged to exercise greater than 150 minutes a week. Patient was encouraged to choose a diet filled with fresh fruits and vegetables, and lean meats. AVS provided to patient today for education/recommendation on gender specific health and safety maintenance. Colonoscopy: routine screen at 64 Mammogram: routine screen 40 Cervical cancer screening: last pap: 12/2019. [facility], Kenton Kingfisher, NP Immunizations: tdap due- provided today 01/29/2020, Influenza UTD 2020 (encouraged yearly).  covid vaccine counseled- although she is at lower risk for reaction> encouraged her to inform vaccine administering site of prior reaction to possibly a flu shot (not proven and rx occurred more than hour after shot)   and be monitored for 30 minutes after covid vaccine. Also encouraged her to take benadryl with her. She has had flu shots since the reaction and other immunizations without incident or reaction.  Infectious disease screening: HIV completed w/ gyn DEXA: routine screen  Return in about 1 year (around 01/28/2021) for CPE (30 min).   Orders Placed This Encounter  Procedures  . CBC  . Comp Met (CMET)  . TSH  . Hemoglobin A1c  . Lipid panel  . Vitamin D (25 hydroxy)   Meds ordered this encounter  Medications  . dicyclomine (BENTYL) 10 MG capsule    Sig: TAKE 1 CAPSULE BY MOUTH FOUR TIMES DAILY BEFORE MEALS AND AT AT BEDTIME    Dispense:  120 capsule    Refill:  3  . montelukast (SINGULAIR) 10 MG tablet    Sig: Take 1 tablet (10 mg total) by mouth at bedtime.    Dispense:  90 tablet    Refill:  3   Referral Orders  No referral(s) requested today     Electronically signed by: Howard Pouch, [place]

## 2020-01-29 NOTE — Patient Instructions (Addendum)
COVID-19 Vaccine Information can be found at: [email or web address] For questions related to vaccine distribution or appointments, please email [email or web address] or call [phone].  Covid Vaccine appointment go to [email or web address].  Allergies:  Continue  Xyzal daily flonase nasal spray daily.  start Singulair before bed   Health Maintenance, Female Adopting a healthy lifestyle and getting preventive care are important in promoting health and wellness. Ask your health care provider about:  The right schedule for you to have regular tests and exams.  Things you can do on your own to prevent diseases and keep yourself healthy. What should I know about diet, weight, and exercise? Eat a healthy diet   Eat a diet that includes plenty of vegetables, fruits, low-fat dairy products, and lean protein.  Do not eat a lot of foods that are high in solid fats, added sugars, or sodium. Maintain a healthy weight Body mass index (BMI) is used to identify weight problems. It estimates body fat based on height and weight. Your health care provider can help determine your BMI and help you achieve or maintain a healthy weight. Get regular exercise Get regular exercise. This is one of the most important things you can do for your health. Most adults should:  Exercise for at least 150 minutes each week. The exercise should increase your heart rate and make you sweat (moderate-intensity exercise).  Do strengthening exercises at least twice a week. This is in addition to the moderate-intensity exercise.  Spend less time sitting. Even light physical activity can be beneficial. Watch cholesterol and blood lipids Have your blood tested for lipids and cholesterol at 29 years of age, then have this test every 5 years. Have your cholesterol levels checked more often if:  Your lipid or cholesterol levels are high.  You are older than 29  years of age.  You are at high risk for heart disease. What should I know about cancer screening? Depending on your health history and family history, you may need to have cancer screening at various ages. This may include screening for:  Breast cancer.  Cervical cancer.  Colorectal cancer.  Skin cancer.  Lung cancer. What should I know about heart disease, diabetes, and high blood pressure? Blood pressure and heart disease  High blood pressure causes heart disease and increases the risk of stroke. This is more likely to develop in people who have high blood pressure readings, are of African descent, or are overweight.  Have your blood pressure checked: ? Every 3-5 years if you are 24-77 years of age. ? Every year if you are 82 years old or older. Diabetes Have regular diabetes screenings. This checks your fasting blood sugar level. Have the screening done:  Once every three years after age 71 if you are at a normal weight and have a low risk for diabetes.  More often and at a younger age if you are overweight or have a high risk for diabetes. What should I know about preventing infection? Hepatitis B If you have a higher risk for hepatitis B, you should be screened for this virus. Talk with your health care provider to find out if you are at risk for hepatitis B infection. Hepatitis C Testing is recommended for:  Everyone born from [birth year] through 1965.  Anyone with known risk factors for hepatitis C. Sexually transmitted infections (STIs)  Get screened for STIs, including gonorrhea and chlamydia, if: ? You are sexually active and are younger than 29 years of age. ? You are older than 29  years of age and your health care provider tells you that you are at risk for this type of infection. ? Your sexual activity has changed since you were last screened, and you are at increased risk for chlamydia or gonorrhea. Ask your health care provider if you are at risk.  Ask your health  care provider about whether you are at high risk for HIV. Your health care provider may recommend a prescription medicine to help prevent HIV infection. If you choose to take medicine to prevent HIV, you should first get tested for HIV. You should then be tested every 3 months for as long as you are taking the medicine. Pregnancy  If you are about to stop having your period (premenopausal) and you may become pregnant, seek counseling before you get pregnant.  Take 400 to 800 micrograms (mcg) of folic acid every day if you become pregnant.  Ask for birth control (contraception) if you want to prevent pregnancy. Osteoporosis and menopause Osteoporosis is a disease in which the bones lose minerals and strength with aging. This can result in bone fractures. If you are 60 years old or older, or if you are at risk for osteoporosis and fractures, ask your health care provider if you should:  Be screened for bone loss.  Take a calcium or vitamin D supplement to lower your risk of fractures.  Be given hormone replacement therapy (HRT) to treat symptoms of menopause. Follow these instructions at home: Lifestyle  Do not use any products that contain nicotine or tobacco, such as cigarettes, e-cigarettes, and chewing tobacco. If you need help quitting, ask your health care provider.  Do not use street drugs.  Do not share needles.  Ask your health care provider for help if you need support or information about quitting drugs. Alcohol use  Do not drink alcohol if: ? Your health care provider tells you not to drink. ? You are pregnant, may be pregnant, or are planning to become pregnant.  If you drink alcohol: ? Limit how much you use to 0-1 drink a day. ? Limit intake if you are breastfeeding.  Be aware of how much alcohol is in your drink. In the [place] one drink equals one 12 oz bottle of beer (355 mL), one 5 oz glass of wine (148 mL), or one 1 oz glass of hard liquor (44 mL). General  instructions  Schedule regular health, dental, and eye exams.  Stay current with your vaccines.  Tell your health care provider if: ? You often feel depressed. ? You have ever been abused or do not feel safe at home. Summary  Adopting a healthy lifestyle and getting preventive care are important in promoting health and wellness.  Follow your health care provider's instructions about healthy diet, exercising, and getting tested or screened for diseases.  Follow your health care provider's instructions on monitoring your cholesterol and blood pressure. This information is not intended to replace advice given to you by your health care provider. Make sure you discuss any questions you have with your health care provider. Document Revised: 10/24/2018 Document Reviewed: 10/24/2018 Elsevier Patient Education  2020 [employer].

## 2020-01-30 LAB — COMPREHENSIVE METABOLIC PANEL
AG Ratio: 1.5 (calc) (ref 1.0–2.5)
ALT: 14 U/L (ref 6–29)
AST: 18 U/L (ref 10–30)
Albumin: 4.1 g/dL (ref 3.6–5.1)
Alkaline phosphatase (APISO): 46 U/L (ref 31–125)
BUN: 10 mg/dL (ref 7–25)
CO2: 23 mmol/L (ref 20–32)
Calcium: 9.6 mg/dL (ref 8.6–10.2)
Chloride: 105 mmol/L (ref 98–110)
Creat: 0.74 mg/dL (ref 0.50–1.10)
Globulin: 2.7 g/dL (calc) (ref 1.9–3.7)
Glucose, Bld: 76 mg/dL (ref 65–99)
Potassium: 4.2 mmol/L (ref 3.5–5.3)
Sodium: 142 mmol/L (ref 135–146)
Total Bilirubin: 0.4 mg/dL (ref 0.2–1.2)
Total Protein: 6.8 g/dL (ref 6.1–8.1)

## 2020-01-30 LAB — CBC
HCT: 40.9 % (ref 35.0–45.0)
Hemoglobin: 14.1 g/dL (ref 11.7–15.5)
MCH: 31.9 pg (ref 27.0–33.0)
MCHC: 34.5 g/dL (ref 32.0–36.0)
MCV: 92.5 fL (ref 80.0–100.0)
MPV: 10.7 fL (ref 7.5–12.5)
Platelets: 185 10*3/uL (ref 140–400)
RBC: 4.42 10*6/uL (ref 3.80–5.10)
RDW: 11.5 % (ref 11.0–15.0)
WBC: 5.6 10*3/uL (ref 3.8–10.8)

## 2020-01-30 LAB — VITAMIN D 25 HYDROXY (VIT D DEFICIENCY, FRACTURES): Vit D, 25-Hydroxy: 46 ng/mL (ref 30–100)

## 2020-01-30 LAB — LIPID PANEL
Cholesterol: 158 mg/dL (ref <200)
HDL: 65 mg/dL (ref >=50)
LDL Cholesterol (Calc): 74 mg/dL (calc)
Non-HDL Cholesterol (Calc): 93 mg/dL (calc) (ref <130)
Total CHOL/HDL Ratio: 2.4 (calc) (ref <5.0)
Triglycerides: 107 mg/dL (ref <150)

## 2020-01-30 LAB — HEMOGLOBIN A1C
Hgb A1c MFr Bld: 4.4 % of total Hgb (ref <5.7)
Mean Plasma Glucose: 80 (calc)
eAG (mmol/L): 4.4 (calc)

## 2020-01-30 LAB — TSH: TSH: 2.94 mIU/L

## 2020-02-18 ENCOUNTER — Ambulatory Visit: Payer: 59 | Attending: Internal Medicine

## 2020-02-18 ENCOUNTER — Other Ambulatory Visit: Payer: Self-pay

## 2020-02-18 DIAGNOSIS — Z20822 Contact with and (suspected) exposure to covid-19: Secondary | ICD-10-CM

## 2020-02-19 LAB — SARS-COV-2, NAA 2 DAY TAT

## 2020-02-19 LAB — NOVEL CORONAVIRUS, NAA: SARS-CoV-2, NAA: NOT DETECTED

## 2020-03-14 ENCOUNTER — Other Ambulatory Visit: Payer: Self-pay | Admitting: Urology

## 2020-03-14 MED ORDER — DOXYCYCLINE HYCLATE 100 MG PO TABS: 100.0000 mg | ORAL_TABLET | Freq: Two times a day (BID) | ORAL | 0 refills | Status: DC

## 2020-03-14 NOTE — Progress Notes (Signed)
I spoke with the patient by phone regarding a 10 day history of sinus congestion, pressure and now with thick yellow-green nasal discharge. She denies any known fever and has not had chills, N/V/D or night sweats. She has has some frontal headaches intermittently as well but it responds to tylenol/ibuprofen prn. She has also developed pressure/discomfort in her ears with popping on occasion.  In the past, she has had similar symptoms associated with sinus infections that end up leading to ear infection if left untreated.  She is unable to get in to see her PCP wince this is the weekend, so I have offered to call in a Rx for antibiotics with the understanding that should her symptoms persist or worsen despite treatment, she will call her PCP immediately for further evaluation and treatment.  She reports her understanding and is comfortable and in agreement with the stated plan.  Suzanne Jarvis, MMS, PA-C [facility] Radiation Oncology Physician Assistant Direct Dial: [phone]  Fax: [phone]

## 2020-04-07 ENCOUNTER — Encounter: Payer: Self-pay | Admitting: Family Medicine

## 2020-04-07 ENCOUNTER — Other Ambulatory Visit: Payer: Self-pay

## 2020-04-07 ENCOUNTER — Ambulatory Visit (INDEPENDENT_AMBULATORY_CARE_PROVIDER_SITE_OTHER): Payer: 59 | Admitting: Family Medicine

## 2020-04-07 VITALS — BP 128/88 | HR 95 | Temp 98.1°F | Resp 18 | Ht 65.0 in | Wt 134.5 lb

## 2020-04-07 DIAGNOSIS — J302 Other seasonal allergic rhinitis: Secondary | ICD-10-CM

## 2020-04-07 DIAGNOSIS — J329 Chronic sinusitis, unspecified: Secondary | ICD-10-CM | POA: Diagnosis not present

## 2020-04-07 DIAGNOSIS — B9689 Other specified bacterial agents as the cause of diseases classified elsewhere: Secondary | ICD-10-CM

## 2020-04-07 MED ORDER — OLOPATADINE HCL 0.1 % OP SOLN: 1.0000 [drp] | Freq: Two times a day (BID) | OPHTHALMIC | 2 refills | Status: DC

## 2020-04-07 MED ORDER — DOXYCYCLINE HYCLATE 100 MG PO TABS: 100.0000 mg | ORAL_TABLET | Freq: Two times a day (BID) | ORAL | 0 refills | Status: DC

## 2020-04-07 NOTE — Progress Notes (Signed)
This visit occurred during the SARS-CoV-2 public health emergency.  Safety protocols were in place, including screening questions prior to the visit, additional usage of staff PPE, and extensive cleaning of exam room while observing appropriate contact time as indicated for disinfecting solutions.    Suzanne Jarvis , 17-Apr-1991, 29 y.o., female MRN: [id number] Patient Care Team    Relationship Specialty Notifications Start End  Natalia Leatherwood, DO PCP - General Family Medicine  09/04/15   Rodell Perna, NP Nurse Practitioner Obstetrics and Gynecology  01/29/20     Chief Complaint  Patient presents with  . Watery eyes    Pt has had allergies for the past month and started getting worse Friday. No fever. Got first COVID vaccine on Tuesday.   . Facial Pain  . Nasal Congestion     Subjective: Pt presents for an OV with complaints of increased allergy symptoms over 1 month with worsening sinus-like symptoms that started 4 days ago.  Patient received her first Covid vaccine last Tuesday.  She is taking Claritin, Xyzal and Singulair at night and using Flonase routinely.  He denies fever.  She endorses sinus pressure, runny nose, nasal congestion and postnasal drip.  She denies fever, chills, cough-except with postnasal drip, denies shortness of breath.  She endorses rather bothersome itchy watery eyes.  Depression screen [facility] 2/9 01/29/2020 11/20/2019  Decreased Interest 0 0  Down, Depressed, Hopeless 0 0  PHQ - 2 Score 0 0    Allergies  Allergen Reactions  . Amoxicillin Hives  . Hyoscyamine Other (See Comments)  . Paxil [Paroxetine Hcl]     Dizziness   . Penicillins     REACTION: hives   Social History   Social History Narrative  . Not on file   Past Medical History:  Diagnosis Date  . Asthma   . Chronic headaches   . IBS (irritable bowel syndrome)   . Tonsillitis    No past surgical history on file. Family History  Problem Relation Age of Onset  . Allergies Mother    . Asthma Mother   . Hypertension Mother   . Arthritis Maternal Grandmother   . Irritable bowel syndrome Maternal Grandmother   . Lung cancer Other        paternal great grandfather  . Lung cancer Other        maternal great grandfather  . Cancer Other        Blood-great grandmother  . Colon cancer Neg Hx    Allergies as of 04/07/2020      Reactions   Amoxicillin Hives   Hyoscyamine Other (See Comments)   Paxil [paroxetine Hcl]    Dizziness   Penicillins    REACTION: hives      Medication List       Accurate as of Apr 07, 2020  7:05 PM. If you have any questions, ask your nurse or doctor.        ascorbic acid 500 MG tablet Commonly known as: VITAMIN C Take by mouth.   B-12 1000 MCG Tabs Take 2 tablets by mouth daily.   Biotin 61607 MCG Tabs Take by mouth. 5,000 MCG daily   dicyclomine 10 MG capsule Commonly known as: BENTYL TAKE 1 CAPSULE BY MOUTH FOUR TIMES DAILY BEFORE MEALS AND AT AT BEDTIME   diphenoxylate-atropine 2.5-0.025 MG tablet Commonly known as: LOMOTIL Take 1 tablet by mouth 2 (two) times daily as needed for diarrhea or loose stools. TAKE 1 TABLET BY MOUTH  FOUR TIMES DAILY AS NEEDED FOR DIARRHEA OR LOOSE STOOLS.   doxycycline 100 MG tablet Commonly known as: VIBRA-TABS Take 1 tablet (100 mg total) by mouth 2 (two) times daily.   drospirenone-ethinyl estradiol 3-0.02 MG tablet Commonly known as: YAZ Take 1 tablet by mouth daily.   fluticasone 50 MCG/ACT nasal spray Commonly known as: FLONASE Place into both nostrils daily.   levocetirizine 5 MG tablet Commonly known as: Xyzal Take 1 tablet (5 mg total) by mouth every evening.   loratadine 10 MG tablet Commonly known as: CLARITIN Take 10 mg by mouth daily.   Magnesium 100 MG Tabs Take by mouth.   montelukast 10 MG tablet Commonly known as: SINGULAIR Take 1 tablet (10 mg total) by mouth at bedtime.   multivitamin with minerals tablet Take 1 tablet by mouth daily.   olopatadine 0.1  % ophthalmic solution Commonly known as: Patanol Place 1 drop into both eyes 2 (two) times daily. Started by: Felix Pacini, DO   Probiotic 250 MG Caps Take by mouth.   Vitamin D (Cholecalciferol) 25 MCG (1000 UT) Caps Take by mouth.       All past medical history, surgical history, allergies, family history, immunizations andmedications were updated in the EMR today and reviewed under the history and medication portions of their EMR.     ROS: Negative, with the exception of above mentioned in HPI   Objective:  BP 128/88 (BP Location: Right Arm, Patient Position: Sitting, Cuff Size: Normal)   Pulse 95   Temp 98.1 F (36.7 C) (Temporal)   Resp 18   Ht 5\' 5"  (1.651 m)   Wt 134 lb 8 oz (61 kg)   LMP 03/17/2020 (Approximate)   SpO2 100%   BMI 22.38 kg/m  Body mass index is 22.38 kg/m. Gen: Afebrile. No acute distress. Nontoxic in appearance, well developed, well nourished.  HENT: AT. [place]. Bilateral TM visualized with bilateral effusions, no erythema. MMM, no oral lesions. Bilateral nares with erythema, mild swelling and drainage. Throat without erythema or exudates.  Postnasal drip present.  No cough.  No hoarseness. Eyes:Pupils Equal Round Reactive to light, Extraocular movements intact,  Conjunctiva without redness, discharge or icterus. Neck/lymp/endocrine: Supple, mild bilateral anterior cervical lymphadenopathy CV: RRR  Chest: CTAB, no wheeze or crackles. Good air movement, normal resp effort.  Skin: no rashes, purpura or petechiae.  Neuro: Normal gait. PERLA. EOMi. Alert. Oriented x3  Psych: Normal affect, dress and demeanor. Normal speech. Normal thought content and judgment.  No exam data present No results found. No results found for this or any previous visit (from the past 24 hour(s)).  Assessment/Plan: Suzanne Jarvis is a 29 y.o. female present for OV for  Seasonal allergies -Allergy symptoms have become worse over the last few years and resistant to multiple  drug therapy.  She would like referral to allergist for testing. - Ambulatory referral to Allergy  Bacterial sinusitis With 1 month of seasonal allergies with worsening sinus pressure 4 days ago.  Signs and symptoms of bacterial sinusitis are present.  Elected to treat with doxycycline twice daily x10 days. Pataday eyedrops prescribed.  If insurance does not cover it can purchase generic over-the-counter Pataday. Continue Xyzal, Singulair and Flonase. Although no known Covid exposure, and symptoms are more consistent with sinusitis> would encourage her to have Covid testing if employer is requiring. - Ambulatory referral to Allergy   Reviewed expectations re: course of current medical issues.  Discussed self-management of symptoms.  Outlined signs and symptoms  indicating need for more acute intervention.  Patient verbalized understanding and all questions were answered.  Patient received an After-Visit Summary.    Orders Placed This Encounter  Procedures  . Ambulatory referral to Allergy   Meds ordered this encounter  Medications  . doxycycline (VIBRA-TABS) 100 MG tablet    Sig: Take 1 tablet (100 mg total) by mouth 2 (two) times daily.    Dispense:  20 tablet    Refill:  0  . olopatadine (PATANOL) 0.1 % ophthalmic solution    Sig: Place 1 drop into both eyes 2 (two) times daily.    Dispense:  5 mL    Refill:  2    Referral Orders     Ambulatory referral to Allergy   Note is dictated utilizing voice recognition software. Although note has been proof read prior to signing, occasional typographical errors still can be missed. If any questions arise, please do not hesitate to call for verification.   electronically signed by:  Howard Pouch, DO  [place]

## 2020-04-09 ENCOUNTER — Emergency Department (HOSPITAL_COMMUNITY): Payer: 59

## 2020-04-09 ENCOUNTER — Encounter (HOSPITAL_COMMUNITY): Payer: Self-pay

## 2020-04-09 ENCOUNTER — Other Ambulatory Visit: Payer: Self-pay

## 2020-04-09 ENCOUNTER — Ambulatory Visit (INDEPENDENT_AMBULATORY_CARE_PROVIDER_SITE_OTHER): Payer: 59 | Admitting: Psychology

## 2020-04-09 DIAGNOSIS — J45909 Unspecified asthma, uncomplicated: Secondary | ICD-10-CM | POA: Diagnosis not present

## 2020-04-09 DIAGNOSIS — Y92410 Unspecified street and highway as the place of occurrence of the external cause: Secondary | ICD-10-CM | POA: Insufficient documentation

## 2020-04-09 DIAGNOSIS — Y9389 Activity, other specified: Secondary | ICD-10-CM | POA: Diagnosis not present

## 2020-04-09 DIAGNOSIS — Y999 Unspecified external cause status: Secondary | ICD-10-CM | POA: Insufficient documentation

## 2020-04-09 DIAGNOSIS — Z79899 Other long term (current) drug therapy: Secondary | ICD-10-CM | POA: Insufficient documentation

## 2020-04-09 DIAGNOSIS — Z87891 Personal history of nicotine dependence: Secondary | ICD-10-CM | POA: Insufficient documentation

## 2020-04-09 DIAGNOSIS — F4312 Post-traumatic stress disorder, chronic: Secondary | ICD-10-CM | POA: Diagnosis not present

## 2020-04-09 DIAGNOSIS — S4992XA Unspecified injury of left shoulder and upper arm, initial encounter: Secondary | ICD-10-CM | POA: Diagnosis present

## 2020-04-09 DIAGNOSIS — M542 Cervicalgia: Secondary | ICD-10-CM | POA: Diagnosis not present

## 2020-04-09 DIAGNOSIS — S40012A Contusion of left shoulder, initial encounter: Secondary | ICD-10-CM | POA: Diagnosis not present
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
## 2020-04-10 ENCOUNTER — Encounter (HOSPITAL_COMMUNITY): Payer: Self-pay | Admitting: Student

## 2020-04-10 ENCOUNTER — Emergency Department (HOSPITAL_COMMUNITY)
Admission: EM | Admit: 2020-04-10 | Discharge: 2020-04-10 | Disposition: A | Discharge status: Home / Self Care | Payer: 59 | Attending: Emergency Medicine | Admitting: Emergency Medicine

## 2020-04-10 MED ORDER — NAPROXEN 500 MG PO TABS: 500.0000 mg | ORAL_TABLET | Freq: Two times a day (BID) | ORAL | 0 refills | Status: DC

## 2020-04-10 MED ORDER — METHOCARBAMOL 500 MG PO TABS
500.0000 mg | ORAL_TABLET | Freq: Three times a day (TID) | ORAL | 0 refills | Status: DC | PRN
Start: 2020-04-10 — End: 2020-04-28
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
## 2020-04-20 ENCOUNTER — Ambulatory Visit (INDEPENDENT_AMBULATORY_CARE_PROVIDER_SITE_OTHER): Payer: 59 | Admitting: Psychology

## 2020-04-20 DIAGNOSIS — F4312 Post-traumatic stress disorder, chronic: Secondary | ICD-10-CM | POA: Diagnosis not present

## 2020-04-21 ENCOUNTER — Other Ambulatory Visit: Payer: Self-pay

## 2020-04-21 ENCOUNTER — Encounter: Payer: Self-pay | Admitting: Orthopaedic Surgery

## 2020-04-21 ENCOUNTER — Ambulatory Visit (INDEPENDENT_AMBULATORY_CARE_PROVIDER_SITE_OTHER): Payer: 59

## 2020-04-21 ENCOUNTER — Ambulatory Visit (INDEPENDENT_AMBULATORY_CARE_PROVIDER_SITE_OTHER): Payer: 59 | Admitting: Orthopaedic Surgery

## 2020-04-21 DIAGNOSIS — M542 Cervicalgia: Secondary | ICD-10-CM | POA: Diagnosis not present

## 2020-04-21 DIAGNOSIS — M545 Low back pain, unspecified: Secondary | ICD-10-CM

## 2020-04-21 MED ORDER — METAXALONE 800 MG PO TABS
800.0000 mg | ORAL_TABLET | Freq: Three times a day (TID) | ORAL | 0 refills | Status: DC
Start: 2020-04-21 — End: 2020-04-28

## 2020-04-21 MED ORDER — IBUPROFEN 800 MG PO TABS: 800.0000 mg | ORAL_TABLET | Freq: Three times a day (TID) | ORAL | 2 refills | Status: DC | PRN
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
## 2020-04-23 ENCOUNTER — Telehealth: Payer: Self-pay
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
## 2020-04-24 NOTE — Telephone Encounter (Signed)
Pt was called and appt was made for referral

## 2020-04-27 NOTE — Progress Notes (Signed)
New Patient Note  RE: Suzanne Jarvis MRN: [id number] DOB: February 25, 1991 Date of Office Visit: 04/28/2020  Referring provider: [name], DO Primary care provider: [name], DO  Chief Complaint: Allergic Rhinitis  (getting worse wants to know what she's allergic to)  History of Present Illness: I had the pleasure of seeing Suzanne Jarvis for initial evaluation at the [facility] on 04/28/2020. She is a 29 y.o. female, who is referred here by Howard Pouch A, DO for the evaluation of seasonal allergies.  She reports symptoms of sinus infections, nasal congestion, PND, rhinorrhea, coughing, itchy/bumpy skin, itchy/watery eyes. Symptoms have been going on for 4-5 years. The symptoms are present from the spring through fall. Other triggers include exposure to cats. Anosmia: no. Headache: sometimes. She has used nettipot, Flonase, Claritin, Xyzal, Singulair, olopatadine with some improvement in symptoms. Sinus infections: yes usually 2 per year. Previous work up includes: none. Previous ENT evaluation: no. Previous sinus imaging: no. History of nasal polyps: no. Last eye exam: few years. History of reflux: patient has IBS and reflux.  Assessment and Plan: Suzanne Jarvis is a 29 y.o. female with: Other allergic rhinitis Rhino conjunctivitis symptoms from the spring through fall for the past 5 years. Tried nettipot, Flonase, Claritin, Xyzal, Singulair, olopatadine with some improvement. 2 sinus infections per year. No previous allergy/ENT evaluation.  Today's skin testing showed: Positive to grass, weed, ragweed, trees, cat, mold, dog, dust mites. Borderline to cockroach.  Negative to common foods.   Results given.   Start environmental control measures as below.  May use over the counter antihistamines such as Zyrtec (cetirizine), Claritin (loratadine), Allegra (fexofenadine), or Xyzal (levocetirizine) daily as needed. May take twice a day if needed. Continue with  Singulair (montelukast) 10mg  daily at night. Cautioned that in some children/adults can experience behavioral changes including hyperactivity, agitation, depression, sleep disturbances and suicidal ideations. These side effects are rare, but if you notice them you should notify me and discontinue Singulair (montelukast).  May use olopatadine eye drops 0.1% twice a day as needed for itchy/watery eyes.  May use Flonase 1 spray per nostril twice a day for nasal congestion.  May use azelastine 1-2 sprays per nostril twice a day as needed for runny nose.  Nasal saline spray (i.e., Simply Saline) or nasal saline lavage (i.e., NeilMed) is recommended as needed and prior to medicated nasal sprays.  Had a detailed discussion with patient/family that clinical history is suggestive of allergic rhinitis, and may benefit from allergy immunotherapy (AIT). Discussed in detail regarding the dosing, schedule, side effects (mild to moderate local allergic reaction and rarely systemic allergic reactions including anaphylaxis), and benefits (significant improvement in nasal symptoms, seasonal flares of asthma) of immunotherapy with the patient. There is significant time commitment involved with allergy shots, which includes weekly immunotherapy injections for first 9-12 months and then biweekly to monthly injections for 3-5 years. Consent was signed.  Start allergy injections in our [place] office.   Allergic conjunctivitis of both eyes  See assessment and plan as above for allergic rhinitis.   Urticaria History of hives in the past with no known triggers.  Monitor symptoms. If has reoccurrence will re-assess then.   Penicillin allergy Broke out in hives after taking penicillin in the past.  Continue avoidance. Consider penicillin testing in future.   Return in about 3 months (around 07/29/2020).  Meds ordered this encounter  Medications  . Azelastine HCl 0.15 % SOLN    Sig: Place 1-2  sprays into the  nose 2 (two) times daily as needed (runny nose).    Dispense:  30 mL    Refill:  5  . EPINEPHrine (AUVI-Q) 0.3 mg/0.3 mL IJ SOAJ injection    Sig: Inject 0.3 mLs (0.3 mg total) into the muscle as needed for anaphylaxis.    Dispense:  1 each    Refill:  1   Other allergy screening: Asthma: no Food allergy: no Medication allergy: yes  Penicillin - hives Paxil - dizziness  Hymenoptera allergy: no Urticaria: yes with no triggers Eczema:yes in the genital area and using some type of cream prescribed by her gynecologist.  History of recurrent infections suggestive of immunodeficency: no  Frequent bronchitis, ear infections, tonsillitis as a child.   Diagnostics: Skin Testing: Environmental allergy panel. Positive to grass, weed, ragweed, trees, cat, mold, dog, dust mites. Borderline to cockroach.  Negative to common foods.  Results discussed with patient/family.  Airborne Adult Perc - 04/28/20 1046    Time Antigen Placed 1046    Allergen Manufacturer Waynette Buttery    Location Back    Number of Test 59    Panel 1 Select    1. Control-Buffer 50% Glycerol Negative    2. Control-Histamine 1 mg/ml 2+    3. Albumin saline Negative    4. Bahia 2+    5. [place] 2+    6. Johnson 2+    7. [place] Blue 4+    8. Meadow Fescue 2+    9. Perennial Rye Negative    10. Sweet Vernal Negative    11. Timothy 3+    12. Cocklebur Negative    13. Burweed Marshelder Negative    14. Ragweed, short --   +/-   15. Ragweed, Giant --   +/-   16. Plantain,  English 2+    17. Lamb's Quarters 2+    18. Sheep Sorrell Negative    19. Rough Pigweed Negative    20. Marsh Elder, Rough Negative    21. Mugwort, Common Negative    22. Ash mix --   +/-   23. Birch mix 3+    24. Beech American 2+    25. Box, Elder Negative    26. Cedar, red --   +/-   27. Cottonwood, Eastern Negative    28. Elm mix Negative    29. Hickory 3+    30. Maple mix --   +/-   31. Oak, Guinea[place]issau mix 4+    32. Pecan Pollen 3+    33.  Pine mix Negative    34. Sycamore Guinea-Bissau --   +/-   35. Walnut, Black Pollen Negative    36. Alternaria alternata Negative    37. Cladosporium Herbarum Negative    38. Aspergillus mix Negative    39. Penicillium mix Negative    40. Bipolaris sorokiniana (Helminthosporium) Negative    41. Drechslera spicifera (Curvularia) Negative    42. Mucor plumbeus Negative    43. Fusarium moniliforme Negative    44. Aureobasidium pullulans (pullulara) Negative    45. Rhizopus oryzae Negative    46. Botrytis cinera Negative    47. Epicoccum nigrum Negative    48. Phoma betae Negative    49. Candida Albicans Negative    50. Trichophyton mentagrophytes Negative    51. Mite, D Farinae  5,000 AU/ml Negative    52. Mite, D Pteronyssinus  5,000 AU/ml Negative    53. Cat Hair 10,000 BAU/ml 2+    54.  Dog Epithelia Negative    55. Mixed Feathers Negative    56. Horse Epithelia Negative    57. Cockroach, German Negative    58. Mouse Negative    59. Tobacco Leaf Negative          Food Perc - 04/28/20 1046      Test Information   Time Antigen Placed 1046    Allergen Manufacturer Waynette Buttery    Location Back    Number of allergen test 10    Food Select      Food   1. Peanut Negative    2. Soybean food Negative    3. Wheat, whole Negative    4. Sesame Negative    5. Milk, cow Negative    6. Egg White, chicken Negative    7. Casein Negative    8. Shellfish mix Negative    9. Fish mix Negative    10. Cashew Negative          Intradermal - 04/28/20 1114    Time Antigen Placed 1115    Allergen Manufacturer Waynette Buttery    Location Arm    Number of Test 8    Intradermal Select    Control Negative    Mold 1 Negative    Mold 2 2+    Mold 3 Negative    Mold 4 2+    Dog 2+    Cockroach --   +/-   Mite mix 3+           Past Medical History: Patient Active Problem List   Diagnosis Date Noted  . Allergic conjunctivitis of both eyes 04/28/2020  . Urticaria 04/28/2020  . Penicillin allergy  04/28/2020  . ASCUS with positive high risk HPV cervical 03/26/2019  . Allergies 10/19/2018  . IBS (irritable bowel syndrome) 09/30/2015  . Erythema migrans (Lyme disease) 07/15/2015  . Vitamin B 12 deficiency 12/10/2014  . Other allergic rhinitis 02/12/2009   Past Medical History:  Diagnosis Date  . Angio-edema   . Asthma   . Chronic headaches   . Eczema   . IBS (irritable bowel syndrome)   . Tonsillitis   . Urticaria    Past Surgical History: History reviewed. No pertinent surgical history. Medication List:  Current Outpatient Medications  Medication Sig Dispense Refill  . ascorbic acid (VITAMIN C) 500 MG tablet Take by mouth.    . Biotin 51025 MCG TABS Take by mouth. 5,000 MCG daily    . Cyanocobalamin (B-12) 1000 MCG TABS Take 2 tablets by mouth daily.    Marland Kitchen dicyclomine (BENTYL) 10 MG capsule TAKE 1 CAPSULE BY MOUTH FOUR TIMES DAILY BEFORE MEALS AND AT AT BEDTIME 120 capsule 3  . diphenoxylate-atropine (LOMOTIL) 2.5-0.025 MG tablet Take 1 tablet by mouth 2 (two) times daily as needed for diarrhea or loose stools. TAKE 1 TABLET BY MOUTH FOUR TIMES DAILY AS NEEDED FOR DIARRHEA OR LOOSE STOOLS. 180 tablet 3  . drospirenone-ethinyl estradiol (YAZ,GIANVI,LORYNA) 3-0.02 MG tablet Take 1 tablet by mouth daily.      . fluticasone (FLONASE) 50 MCG/ACT nasal spray Place into both nostrils daily.    Marland Kitchen ibuprofen (ADVIL) 800 MG tablet Take 1 tablet (800 mg total) by mouth every 8 (eight) hours as needed. 30 tablet 2  . levocetirizine (XYZAL) 5 MG tablet Take 1 tablet (5 mg total) by mouth every evening. 90 tablet 3  . loratadine (CLARITIN) 10 MG tablet Take 10 mg by mouth daily.    . Magnesium 100 MG TABS Take  by mouth.    . montelukast (SINGULAIR) 10 MG tablet Take 1 tablet (10 mg total) by mouth at bedtime. 90 tablet 3  . Multiple Vitamins-Minerals (MULTIVITAMIN WITH MINERALS) tablet Take 1 tablet by mouth daily.    Marland Kitchen olopatadine (PATANOL) 0.1 % ophthalmic solution Place 1 drop into both  eyes 2 (two) times daily. 5 mL 2  . Vitamin D, Cholecalciferol, 25 MCG (1000 UT) CAPS Take by mouth.    . Azelastine HCl 0.15 % SOLN Place 1-2 sprays into the nose 2 (two) times daily as needed (runny nose). 30 mL 5  . EPINEPHrine (AUVI-Q) 0.3 mg/0.3 mL IJ SOAJ injection Inject 0.3 mLs (0.3 mg total) into the muscle as needed for anaphylaxis. 1 each 1   No current facility-administered medications for this visit.   Allergies: Allergies  Allergen Reactions  . Amoxicillin Hives  . Hyoscyamine Other (See Comments)  . Paxil [Paroxetine Hcl]     Dizziness   . Penicillins     REACTION: hives   Social History: Social History   Socioeconomic History  . Marital status: Divorced    Spouse name: Not on file  . Number of children: 0  . Years of education: Not on file  . Highest education level: Not on file  Occupational History  . Occupation: cosmetologist  Tobacco Use  . Smoking status: Former Smoker    Packs/day: 0.25    Years: 6.00    Pack years: 1.50    Types: Cigarettes  . Smokeless tobacco: Never Used  Vaping Use  . Vaping Use: Never used  Substance and Sexual Activity  . Alcohol use: Yes    Comment: rare  . Drug use: No  . Sexual activity: Not on file  Other Topics Concern  . Not on file  Social History Narrative  . Not on file   Social Determinants of Health   Financial Resource Strain:   . Difficulty of Paying Living Expenses:   Food Insecurity:   . Worried About Programme researcher, broadcasting/film/video in the Last Year:   . Barista in the Last Year:   Transportation Needs:   . Freight forwarder (Medical):   Marland Kitchen Lack of Transportation (Non-Medical):   Physical Activity:   . Days of Exercise per Week:   . Minutes of Exercise per Session:   Stress:   . Feeling of Stress :   Social Connections:   . Frequency of Communication with Friends and Family:   . Frequency of Social Gatherings with Friends and Family:   . Attends Religious Services:   . Active Member of Clubs  or Organizations:   . Attends Banker Meetings:   Marland Kitchen Marital Status:    Lives in a townhome which is 29 year old for about 1 year. Smoking: vape Occupation: hairdresser  Landscape architect HistorySurveyor, minerals in the house: no Engineer, civil (consulting) in the family room: no Carpet in the bedroom: no Heating: electric Cooling: central Pet: yes 1 gecko x 2 yrs  Family History: Family History  Problem Relation Age of Onset  . Allergies Mother   . Asthma Mother   . Hypertension Mother   . Allergic rhinitis Mother   . Urticaria Mother   . Arthritis Maternal Grandmother   . Irritable bowel syndrome Maternal Grandmother   . Allergic rhinitis Father   . Allergic rhinitis Sister   . Eczema Sister   . Lung cancer Other        paternal great grandfather  .  Lung cancer Other        maternal great grandfather  . Cancer Other        Blood-great grandmother  . Colon cancer Neg Hx    Review of Systems  Constitutional: Negative for appetite change, chills, fever and unexpected weight change.  HENT: Positive for congestion, postnasal drip, rhinorrhea and sneezing.   Eyes: Positive for itching.  Respiratory: Negative for cough, chest tightness, shortness of breath and wheezing.   Cardiovascular: Negative for chest pain.  Gastrointestinal: Negative for abdominal pain.  Genitourinary: Negative for difficulty urinating.  Skin: Negative for rash.  Allergic/Immunologic: Positive for environmental allergies. Negative for food allergies.  Neurological: Negative for headaches.   Objective: BP 134/90 (BP Location: Left Arm, Patient Position: Sitting, Cuff Size: Normal)   Pulse 88   Temp 98.2 F (36.8 C) (Temporal)   Resp 18   Ht 5' 4.5" (1.638 m)   Wt 132 lb (59.9 kg)   SpO2 98%   BMI 22.31 kg/m  Body mass index is 22.31 kg/m. Physical Exam Vitals and nursing note reviewed.  Constitutional:      Appearance: Normal appearance. She is well-developed and normal weight.  HENT:      Head: Normocephalic and atraumatic.     Right Ear: Tympanic membrane and external ear normal.     Left Ear: Tympanic membrane and external ear normal.     Nose: Nose normal.     Mouth/Throat:     Mouth: Mucous membranes are moist.     Pharynx: Oropharynx is clear.  Eyes:     Conjunctiva/sclera: Conjunctivae normal.  Cardiovascular:     Rate and Rhythm: Normal rate and regular rhythm.     Heart sounds: Normal heart sounds. No murmur heard.  No friction rub. No gallop.   Pulmonary:     Effort: Pulmonary effort is normal.     Breath sounds: Normal breath sounds. No wheezing, rhonchi or rales.  Abdominal:     Palpations: Abdomen is soft.  Musculoskeletal:     Cervical back: Neck supple.  Skin:    General: Skin is warm.     Findings: No rash.  Neurological:     Mental Status: She is alert and oriented to person, place, and time.  Psychiatric:        Mood and Affect: Mood normal.        Behavior: Behavior normal.    The plan was reviewed with the patient/family, and all questions/concerned were addressed.  It was my pleasure to see Suzanne Jarvis today and participate in her care. Please feel free to contact me with any questions or concerns.  Sincerely,  Wyline MoodYoon Christianna Belmonte, DO Allergy & Immunology  [facility] [place] office: [phone] [facility] office: [phone] [place] office: [phone]

## 2020-04-28 ENCOUNTER — Ambulatory Visit (INDEPENDENT_AMBULATORY_CARE_PROVIDER_SITE_OTHER): Payer: 59 | Admitting: Allergy

## 2020-04-28 ENCOUNTER — Other Ambulatory Visit: Payer: Self-pay

## 2020-04-28 ENCOUNTER — Encounter: Payer: Self-pay | Admitting: Allergy

## 2020-04-28 VITALS — BP 134/90 | HR 88 | Temp 98.2°F | Resp 18 | Ht 64.5 in | Wt 132.0 lb

## 2020-04-28 DIAGNOSIS — H1013 Acute atopic conjunctivitis, bilateral: Secondary | ICD-10-CM | POA: Diagnosis not present

## 2020-04-28 DIAGNOSIS — Z880 Allergy status to penicillin: Secondary | ICD-10-CM | POA: Diagnosis not present

## 2020-04-28 DIAGNOSIS — L509 Urticaria, unspecified: Secondary | ICD-10-CM | POA: Diagnosis not present

## 2020-04-28 DIAGNOSIS — L2089 Other atopic dermatitis: Secondary | ICD-10-CM | POA: Insufficient documentation

## 2020-04-28 DIAGNOSIS — J3089 Other allergic rhinitis: Secondary | ICD-10-CM

## 2020-04-28 HISTORY — DX: Allergy status to penicillin: Z88.0

## 2020-04-28 MED ORDER — EPINEPHRINE 0.3 MG/0.3ML IJ SOAJ: 0.3000 mg | INTRAMUSCULAR | 1 refills | Status: DC | PRN

## 2020-04-28 MED ORDER — AZELASTINE HCL 0.15 % NA SOLN: 1.0000 | Freq: Two times a day (BID) | NASAL | 5 refills | Status: DC | PRN

## 2020-04-28 NOTE — Assessment & Plan Note (Signed)
   See assessment and plan as above for allergic rhinitis.  

## 2020-04-28 NOTE — Assessment & Plan Note (Signed)
Broke out in hives after taking penicillin in the past.  Continue avoidance. Consider penicillin testing in future.

## 2020-04-28 NOTE — Patient Instructions (Addendum)
Today's skin testing showed: Positive to grass, weed, ragweed, trees, cat, mold, dog, dust mites. Borderline to cockroach.  Negative to common foods.  Results given.   Environmental allergies  Start environmental control measures as below.  May use over the counter antihistamines such as Zyrtec (cetirizine), Claritin (loratadine), Allegra (fexofenadine), or Xyzal (levocetirizine) daily as needed. May take twice a day if needed. Continue with Singulair (montelukast) 10mg  daily at night. Cautioned that in some children/adults can experience behavioral changes including hyperactivity, agitation, depression, sleep disturbances and suicidal ideations. These side effects are rare, but if you notice them you should notify me and discontinue Singulair (montelukast).  May use olopatadine eye drops 0.1% twice a day as needed for itchy/watery eyes.  May use Flonase 1 spray per nostril twice a day for nasal congestion.  May use azelastine 1-2 sprays per nostril twice a day as needed for runny nose.  Nasal saline spray (i.e., Simply Saline) or nasal saline lavage (i.e., NeilMed) is recommended as needed and prior to medicated nasal sprays.  Had a detailed discussion with patient/family that clinical history is suggestive of allergic rhinitis, and may benefit from allergy immunotherapy (AIT). Discussed in detail regarding the dosing, schedule, side effects (mild to moderate local allergic reaction and rarely systemic allergic reactions including anaphylaxis), and benefits (significant improvement in nasal symptoms, seasonal flares of asthma) of immunotherapy with the patient. There is significant time commitment involved with allergy shots, which includes weekly immunotherapy injections for first 9-12 months and then biweekly to monthly injections for 3-5 years. Consent was signed.  Start allergy injections in our [place] office.   Follow up in 3-4 months or sooner if needed.   Reducing Pollen  Exposure . Pollen seasons: trees (spring), grass (summer) and ragweed/weeds (fall). 03-23-1981 Keep windows closed in your home and car to lower pollen exposure.  Marland Kitchen air conditioning in the bedroom and throughout the house if possible.  . Avoid going out in dry windy days - especially early morning. . Pollen counts are highest between 5 - 10 AM and on dry, hot and windy days.  . Save outside activities for late afternoon or after a heavy rain, when pollen levels are lower.  . Avoid mowing of grass if you have grass pollen allergy. Lilian Kapur Be aware that pollen can also be transported indoors on people and pets.  . Dry your clothes in an automatic dryer rather than hanging them outside where they might collect pollen.  . Rinse hair and eyes before bedtime.  Pet Allergen Avoidance: . Contrary to popular opinion, there are no "hypoallergenic" breeds of dogs or cats. That is because people are not allergic to an animal's hair, but to an allergen found in the animal's saliva, dander (dead skin flakes) or urine. Pet allergy symptoms typically occur within minutes. For some people, symptoms can build up and become most severe 8 to 12 hours after contact with the animal. People with severe allergies can experience reactions in public places if dander has been transported on the pet owners' clothing. Marland Kitchen Keeping an animal outdoors is only a partial solution, since homes with pets in the yard still have higher concentrations of animal allergens. . Before getting a pet, ask your allergist to determine if you are allergic to animals. If your pet is already considered part of your family, try to minimize contact and keep the pet out of the bedroom and other rooms where you spend a great deal of time. . As with dust mites, vacuum  carpets often or replace carpet with a hardwood floor, tile or linoleum. . High-efficiency particulate air (HEPA) cleaners can reduce allergen levels over time. . While dander and saliva are the  source of cat and dog allergens, urine is the source of allergens from rabbits, hamsters, mice and [place] pigs; so ask a non-allergic family member to clean the animal's cage. . If you have a pet allergy, talk to your allergist about the potential for allergy immunotherapy (allergy shots). This strategy can often provide long-term relief. Mold Control . Mold and fungi can grow on a variety of surfaces provided certain temperature and moisture conditions exist.  . Outdoor molds grow on plants, decaying vegetation and soil. The major outdoor mold, Alternaria and Cladosporium, are found in very high numbers during hot and dry conditions. Generally, a late summer - fall peak is seen for common outdoor fungal spores. Rain will temporarily lower outdoor mold spore count, but counts rise rapidly when the rainy period ends. . The most important indoor molds are Aspergillus and Penicillium. Dark, humid and poorly ventilated basements are ideal sites for mold growth. The next most common sites of mold growth are the bathroom and the kitchen. Outdoor (Seasonal) Mold Control . Use air conditioning and keep windows closed. . Avoid exposure to decaying vegetation. Marland Kitchen Avoid leaf raking. . Avoid grain handling. . Consider wearing a face mask if working in moldy areas.  Indoor (Perennial) Mold Control  . Maintain humidity below 50%. . Get rid of mold growth on hard surfaces with water, detergent and, if necessary, 5% bleach (do not mix with other cleaners). Then dry the area completely. If mold covers an area more than 10 square feet, consider hiring an indoor environmental professional. . For clothing, washing with soap and water is best. If moldy items cannot be cleaned and dried, throw them away. . Remove sources e.g. contaminated carpets. . Repair and seal leaking roofs or pipes. Using dehumidifiers in damp basements may be helpful, but empty the water and clean units regularly to prevent mildew from forming. All  rooms, especially basements, bathrooms and kitchens, require ventilation and cleaning to deter mold and mildew growth. Avoid carpeting on concrete or damp floors, and storing items in damp areas. Control of House Dust Mite Allergen . Dust mite allergens are a common trigger of allergy and asthma symptoms. While they can be found throughout the house, these microscopic creatures thrive in warm, humid environments such as bedding, upholstered furniture and carpeting. . Because so much time is spent in the bedroom, it is essential to reduce mite levels there.  . Encase pillows, mattresses, and box springs in special allergen-proof fabric covers or airtight, zippered plastic covers.  . Bedding should be washed weekly in hot water (130 F) and dried in a hot dryer. Allergen-proof covers are available for comforters and pillows that can't be regularly washed.  Wendee Copp the allergy-proof covers every few months. Minimize clutter in the bedroom. Keep pets out of the bedroom.  Marland Kitchen Keep humidity less than 50% by using a dehumidifier or air conditioning. You can buy a humidity measuring device called a hygrometer to monitor this.  . If possible, replace carpets with hardwood, linoleum, or washable area rugs. If that's not possible, vacuum frequently with a vacuum that has a HEPA filter. . Remove all upholstered furniture and non-washable window drapes from the bedroom. . Remove all non-washable stuffed toys from the bedroom.  Wash stuffed toys weekly. Cockroach Allergen Avoidance Cockroaches are often found in  the homes of densely populated urban areas, schools or commercial buildings, but these creatures can lurk almost anywhere. This does not mean that you have a dirty house or living area. . Block all areas where roaches can enter the home. This includes crevices, wall cracks and windows.  . Cockroaches need water to survive, so fix and seal all leaky faucets and pipes. Have an exterminator go through the house  when your family and pets are gone to eliminate any remaining roaches. Marland Kitchen Keep food in lidded containers and put pet food dishes away after your pets are done eating. Vacuum and sweep the floor after meals, and take out garbage and recyclables. Use lidded garbage containers in the kitchen. Wash dishes immediately after use and clean under stoves, refrigerators or toasters where crumbs can accumulate. Wipe off the stove and other kitchen surfaces and cupboards regularly.  Skin care recommendations  Bath time: . Always use lukewarm water. AVOID very hot or cold water. Marland Kitchen Keep bathing time to 5-10 minutes. . Do NOT use bubble bath. . Use a mild soap and use just enough to wash the dirty areas. . Do NOT scrub skin vigorously.  . After bathing, pat dry your skin with a towel. Do NOT rub or scrub the skin.  Moisturizers and prescriptions:  . ALWAYS apply moisturizers immediately after bathing (within 3 minutes). This helps to lock-in moisture. . Use the moisturizer several times a day over the whole body. Peri Jefferson summer moisturizers include: Aveeno, CeraVe, Cetaphil. Peri Jefferson winter moisturizers include: Aquaphor, Vaseline, Cerave, Cetaphil, Eucerin, Vanicream. . When using moisturizers along with medications, the moisturizer should be applied about one hour after applying the medication to prevent diluting effect of the medication or moisturize around where you applied the medications. When not using medications, the moisturizer can be continued twice daily as maintenance.  Laundry and clothing: . Avoid laundry products with added color or perfumes. . Use unscented hypo-allergenic laundry products such as Tide free, Cheer free & gentle, and All free and clear.  . If the skin still seems dry or sensitive, you can try double-rinsing the clothes. . Avoid tight or scratchy clothing such as wool. . Do not use fabric softeners or dyer sheets.

## 2020-04-28 NOTE — Assessment & Plan Note (Signed)
History of hives in the past with no known triggers.  Monitor symptoms. If has reoccurrence will re-assess then.

## 2020-04-28 NOTE — Assessment & Plan Note (Signed)
Rhino conjunctivitis symptoms from the spring through fall for the past 5 years. Tried nettipot, Flonase, Claritin, Xyzal, Singulair, olopatadine with some improvement. 2 sinus infections per year. No previous allergy/ENT evaluation.  Today's skin testing showed: Positive to grass, weed, ragweed, trees, cat, mold, dog, dust mites. Borderline to cockroach.  Negative to common foods.   Results given.   Start environmental control measures as below.  May use over the counter antihistamines such as Zyrtec (cetirizine), Claritin (loratadine), Allegra (fexofenadine), or Xyzal (levocetirizine) daily as needed. May take twice a day if needed. Continue with Singulair (montelukast) 10mg  daily at night. Cautioned that in some children/adults can experience behavioral changes including hyperactivity, agitation, depression, sleep disturbances and suicidal ideations. These side effects are rare, but if you notice them you should notify me and discontinue Singulair (montelukast).  May use olopatadine eye drops 0.1% twice a day as needed for itchy/watery eyes.  May use Flonase 1 spray per nostril twice a day for nasal congestion.  May use azelastine 1-2 sprays per nostril twice a day as needed for runny nose.  Nasal saline spray (i.e., Simply Saline) or nasal saline lavage (i.e., NeilMed) is recommended as needed and prior to medicated nasal sprays.  Had a detailed discussion with patient/family that clinical history is suggestive of allergic rhinitis, and may benefit from allergy immunotherapy (AIT). Discussed in detail regarding the dosing, schedule, side effects (mild to moderate local allergic reaction and rarely systemic allergic reactions including anaphylaxis), and benefits (significant improvement in nasal symptoms, seasonal flares of asthma) of immunotherapy with the patient. There is significant time commitment involved with allergy shots, which includes weekly immunotherapy injections for first 9-12  months and then biweekly to monthly injections for 3-5 years. Consent was signed.  Start allergy injections in our [place] office.

## 2020-04-29 ENCOUNTER — Encounter: Payer: Self-pay | Admitting: Family Medicine

## 2020-04-29 ENCOUNTER — Ambulatory Visit (INDEPENDENT_AMBULATORY_CARE_PROVIDER_SITE_OTHER): Payer: 59 | Admitting: Family Medicine

## 2020-04-29 ENCOUNTER — Encounter: Payer: Self-pay | Admitting: Physical Therapy

## 2020-04-29 ENCOUNTER — Ambulatory Visit (INDEPENDENT_AMBULATORY_CARE_PROVIDER_SITE_OTHER): Payer: 59 | Admitting: Physical Therapy

## 2020-04-29 VITALS — BP 120/85 | HR 103 | Temp 98.5°F | Resp 18 | Ht 65.0 in | Wt 133.1 lb

## 2020-04-29 DIAGNOSIS — G4452 New daily persistent headache (NDPH): Secondary | ICD-10-CM | POA: Diagnosis not present

## 2020-04-29 DIAGNOSIS — R413 Other amnesia: Secondary | ICD-10-CM

## 2020-04-29 DIAGNOSIS — M542 Cervicalgia: Secondary | ICD-10-CM | POA: Diagnosis not present

## 2020-04-29 DIAGNOSIS — H539 Unspecified visual disturbance: Secondary | ICD-10-CM

## 2020-04-29 DIAGNOSIS — M545 Low back pain, unspecified: Secondary | ICD-10-CM

## 2020-04-29 DIAGNOSIS — R293 Abnormal posture: Secondary | ICD-10-CM

## 2020-04-29 DIAGNOSIS — R29898 Other symptoms and signs involving the musculoskeletal system: Secondary | ICD-10-CM | POA: Diagnosis not present

## 2020-04-29 MED ORDER — DICLOFENAC SODIUM 75 MG PO TBEC
75.0000 mg | DELAYED_RELEASE_TABLET | Freq: Two times a day (BID) | ORAL | 1 refills | Status: DC
Start: 2020-04-29 — End: 2020-10-14

## 2020-04-29 NOTE — Progress Notes (Signed)
EXP 05/04/21 
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
## 2020-04-29 NOTE — Patient Instructions (Signed)
Access Code: [email or web address] URL: [email or web address] Date: 04/29/2020 Prepared by: Moshe Cipro  Exercises Standing Backward Shoulder Rolls - 2 x daily - 7 x weekly - 10 reps - 1 sets Seated Scapular Retraction - 2 x daily - 7 x weekly - 10 reps - 1 sets - 5 sec hold Seated Upper Trapezius Stretch - 2 x daily - 7 x weekly - 3 reps - 1 sets - 30 sec hold Seated Levator Scapulae Stretch - 2 x daily - 7 x weekly - 1 reps - 1 sets - 30 sec hold Seated Assisted Cervical Rotation with Towel - 2 x daily - 7 x weekly - 1 sets - 5 reps - 10 sec hold Hooklying Single Knee to Chest - 2 x daily - 7 x weekly - 3 reps - 1 sets - 30 sec hold Supine Double Knee to Chest - 2 x daily - 7 x weekly - 3 reps - 1 sets - 30 sec hold Supine Lower Trunk Rotation - 2 x daily - 7 x weekly - 1 sets - 5 reps - 10 sec hold  Patient Education Trigger Point Dry Needling

## 2020-04-29 NOTE — Patient Instructions (Signed)
We will call you with lab results and MRI results once we receive them.   You will receive a call to set up your MRI. Once we get those results we will decide on neurology referral location.   Start diclofenac every 12 hours with food. This is not a controlled substance pain medication and will be ok for you to use.
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
## 2020-04-30 LAB — BASIC METABOLIC PANEL
BUN: 10 mg/dL (ref 7–25)
CO2: 24 mmol/L (ref 20–32)
Calcium: 9.1 mg/dL (ref 8.6–10.2)
Chloride: 104 mmol/L (ref 98–110)
Creat: 0.81 mg/dL (ref 0.50–1.10)
Glucose, Bld: 106 mg/dL — ABNORMAL HIGH (ref 65–99)
Potassium: 4.2 mmol/L (ref 3.5–5.3)
Sodium: 140 mmol/L (ref 135–146)

## 2020-04-30 LAB — CBC
HCT: 37.4 % (ref 35.0–45.0)
Hemoglobin: 13.1 g/dL (ref 11.7–15.5)
MCH: 31.6 pg (ref 27.0–33.0)
MCHC: 35 g/dL (ref 32.0–36.0)
MCV: 90.1 fL (ref 80.0–100.0)
MPV: 10.8 fL (ref 7.5–12.5)
Platelets: 221 10*3/uL (ref 140–400)
RBC: 4.15 10*6/uL (ref 3.80–5.10)
RDW: 12 % (ref 11.0–15.0)
WBC: 6.7 10*3/uL (ref 3.8–10.8)

## 2020-05-04 DIAGNOSIS — J301 Allergic rhinitis due to pollen: Secondary | ICD-10-CM

## 2020-05-05 DIAGNOSIS — J3089 Other allergic rhinitis: Secondary | ICD-10-CM

## 2020-05-06 ENCOUNTER — Ambulatory Visit (INDEPENDENT_AMBULATORY_CARE_PROVIDER_SITE_OTHER): Payer: 59 | Admitting: Psychology

## 2020-05-06 DIAGNOSIS — F4312 Post-traumatic stress disorder, chronic: Secondary | ICD-10-CM

## 2020-05-09 ENCOUNTER — Ambulatory Visit (HOSPITAL_BASED_OUTPATIENT_CLINIC_OR_DEPARTMENT_OTHER)
Admission: RE | Admit: 2020-05-09 | Discharge: 2020-05-09 | Disposition: A | Discharge status: Home / Self Care | Payer: 59 | Source: Ambulatory Visit | Attending: Family Medicine | Admitting: Family Medicine

## 2020-05-09 ENCOUNTER — Other Ambulatory Visit: Payer: Self-pay

## 2020-05-09 DIAGNOSIS — T1490XA Injury, unspecified, initial encounter: Secondary | ICD-10-CM | POA: Insufficient documentation

## 2020-05-09 DIAGNOSIS — H539 Unspecified visual disturbance: Secondary | ICD-10-CM

## 2020-05-09 DIAGNOSIS — R413 Other amnesia: Secondary | ICD-10-CM | POA: Diagnosis not present

## 2020-05-09 DIAGNOSIS — H538 Other visual disturbances: Secondary | ICD-10-CM | POA: Insufficient documentation

## 2020-05-09 DIAGNOSIS — G4452 New daily persistent headache (NDPH): Secondary | ICD-10-CM | POA: Diagnosis present

## 2020-05-09 MED ORDER — GADOBUTROL 1 MMOL/ML IV SOLN
6.0000 mL | Freq: Once | INTRAVENOUS | Status: AC | PRN
  Administered 2020-05-09: 6 mL via INTRAVENOUS

## 2020-05-11 ENCOUNTER — Telehealth: Payer: Self-pay | Admitting: Family Medicine

## 2020-05-11 DIAGNOSIS — G4452 New daily persistent headache (NDPH): Secondary | ICD-10-CM

## 2020-05-11 DIAGNOSIS — H539 Unspecified visual disturbance: Secondary | ICD-10-CM

## 2020-05-11 DIAGNOSIS — R413 Other amnesia: Secondary | ICD-10-CM

## 2020-05-11 MED ORDER — LEVOFLOXACIN 500 MG PO TABS
500.0000 mg | ORAL_TABLET | Freq: Every day | ORAL | 0 refills | Status: DC
Start: 2020-05-11 — End: 2020-05-27

## 2020-05-11 NOTE — Telephone Encounter (Signed)
Pt was called and given information, she verbalized understanding  

## 2020-05-11 NOTE — Telephone Encounter (Signed)
Please inform patient the following information: MRI of her brain/head is normal with the exception of chronic sinusitis signs. I have called in a medication to take once daily for 7 days to attempt to treat her sinusitis.  I have also referred her to our concussion clinic.  Although she does not recall a head injury or loss of consciousness.  Her normal MRI and memory symptoms suggest a concussion as possible cause.
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
## 2020-05-11 NOTE — Telephone Encounter (Signed)
Called pt and scheduled her for new pt appt on Wednesday at 2 pm

## 2020-05-13 ENCOUNTER — Ambulatory Visit (INDEPENDENT_AMBULATORY_CARE_PROVIDER_SITE_OTHER): Payer: 59 | Admitting: Rehabilitative and Restorative Service Providers"

## 2020-05-13 ENCOUNTER — Encounter: Payer: Self-pay | Admitting: Family Medicine

## 2020-05-13 ENCOUNTER — Other Ambulatory Visit: Payer: Self-pay

## 2020-05-13 ENCOUNTER — Ambulatory Visit (INDEPENDENT_AMBULATORY_CARE_PROVIDER_SITE_OTHER): Payer: 59 | Admitting: Family Medicine

## 2020-05-13 ENCOUNTER — Encounter: Payer: Self-pay | Admitting: Rehabilitative and Restorative Service Providers"

## 2020-05-13 VITALS — BP 102/78 | HR 97 | Ht 65.0 in | Wt 135.0 lb

## 2020-05-13 DIAGNOSIS — R293 Abnormal posture: Secondary | ICD-10-CM | POA: Diagnosis not present

## 2020-05-13 DIAGNOSIS — M542 Cervicalgia: Secondary | ICD-10-CM | POA: Diagnosis not present

## 2020-05-13 DIAGNOSIS — S060X0A Concussion without loss of consciousness, initial encounter: Secondary | ICD-10-CM

## 2020-05-13 DIAGNOSIS — M545 Low back pain, unspecified: Secondary | ICD-10-CM

## 2020-05-13 DIAGNOSIS — R29898 Other symptoms and signs involving the musculoskeletal system: Secondary | ICD-10-CM | POA: Diagnosis not present

## 2020-05-13 MED ORDER — NORTRIPTYLINE HCL 25 MG PO CAPS: 25.0000 mg | ORAL_CAPSULE | Freq: Every day | ORAL | 2 refills | Status: DC

## 2020-05-13 NOTE — Patient Instructions (Addendum)
Thank you for coming in today. Start nortryptline at bedtime.  This will help with sleep and headache.  Recheck in about 2 weeks.  Let me know sooner if this is not working.    Concussion, Adult  A concussion is a brain injury from a hard, direct hit (trauma) to the head or body. This direct hit causes the brain to shake quickly back and forth inside the skull. This can damage brain cells and cause chemical changes in the brain. A concussion may also be known as a mild traumatic brain injury (TBI). Concussions are usually not life-threatening, but the effects of a concussion can be serious. If you have a concussion, you should be very careful to avoid having a second concussion. What are the causes? This condition is caused by:  A direct hit to your head, such as: ? Running into another player during a game. ? Being hit in a fight. ? Hitting your head on a hard surface.  Sudden movement of your body that causes your brain to move back and forth inside the skull, such as in a car crash. What are the signs or symptoms? The signs of a concussion can be hard to notice. Early on, they may be missed by you, family members, and health care providers. You may look fine on the outside but may act or feel differently. Symptoms are usually temporary and most often improve in 7-10 days. Some symptoms appear right away, but other symptoms may not show up for hours or days. If your symptoms last longer than normal, you may have post-concussion syndrome. Every head injury is different. Physical symptoms  Headaches. This can include a feeling of pressure in the head or migraine-like symptoms.  Tiredness (fatigue).  Dizziness.  Problems with coordination or balance.  Vision or hearing problems.  Sensitivity to light or noise.  Nausea or vomiting.  Changes in eating or sleeping patterns.  Numbness or tingling.  Seizure. Mental and emotional symptoms  Memory problems.  Trouble  concentrating, organizing, or making decisions.  Slowness in thinking, acting or reacting, speaking, or reading.  Irritability or mood changes.  Anxiety or depression. How is this diagnosed? This condition is diagnosed based on:  Your symptoms.  A description of your injury. You may also have tests, including:  Imaging tests, such as a CT scan or MRI.  Neuropsychological tests. These measure your thinking, understanding, learning, and remembering abilities. How is this treated? Treatment for this condition includes:  Stopping sports or activity if you are injured. If you hit your head or show signs of concussion: ? Do not return to sports or activities the same day. ? Get checked by a health care provider before you return to your activities.  Physical and mental rest and careful observation, usually at home. Gradually return to your normal activities.  Medicines to help with symptoms such as headaches, nausea, or difficulty sleeping. ? Avoid taking opioid pain medicine while recovering from a concussion.  Avoiding alcohol and drugs. These may slow your recovery and can put you at risk of further injury.  Referral to a concussion clinic or rehabilitation center. Recovery from a concussion can take time. How fast you recover depends on many factors. Return to activities only when:  Your symptoms are completely gone.  Your health care provider says that it is safe. Follow these instructions at home: Activity  Limit activities that require a lot of thought or concentration, such as: ? Doing homework or job-related work. ? Watching  TV. ? Working on the computer or phone. ? Playing memory games and puzzles.  Rest. Rest helps your brain heal. Make sure you: ? Get plenty of sleep. Most adults should get 7-9 hours of sleep each night. ? Rest during the day. Take naps or rest breaks when you feel tired.  Avoid physical activity like exercise until your health care provider  says it is safe. Stop any activity that worsens symptoms.  Do not do high-risk activities that could cause a second concussion, such as riding a bike or playing sports.  Ask your health care provider when you can return to your normal activities, such as school, work, athletics, and driving. Your ability to react may be slower after a brain injury. Never do these activities if you are dizzy. Your health care provider will likely give you a plan for gradually returning to activities. General instructions   Take over-the-counter and prescription medicines only as told by your health care provider. Some medicines, such as blood thinners (anticoagulants) and aspirin, may increase the risk for complications, such as bleeding.  Do not drink alcohol until your health care provider says you can.  Watch your symptoms and tell others around you to do the same. Complications sometimes occur after a concussion. Older adults with a brain injury may have a higher risk of serious complications.  Tell your work Production designer, theatre/television/film, teachers, Tax adviser, school counselor, coach, or Event organiser about your injury, symptoms, and restrictions.  Keep all follow-up visits as told by your health care provider. This is important. How is this prevented? Avoiding another brain injury is very important. In rare cases, another injury can lead to permanent brain damage, brain swelling, or death. The risk of this is greatest during the first 7-10 days after a head injury. Avoid injuries by:  Stopping activities that could lead to a second concussion, such as contact or recreational sports, until your health care provider says it is okay.  Taking these actions once you have returned to sports or activities: ? Avoiding plays or moves that can cause you to crash into another person. This is how most concussions occur. ? Following the rules and being respectful of other players. Do not engage in violent or illegal plays.  Getting  regular exercise that includes strength and balance training.  Wearing a properly fitting helmet during sports, biking, or other activities. Helmets can help protect you from serious skull and brain injuries, but they do not protect you from a concussion. Even when wearing a helmet, you should avoid being hit in the head. Contact a health care provider if:  Your symptoms get worse or they do not improve.  You have new symptoms.  You have another injury. Get help right away if:  You have severe or worsening headaches.  You have weakness or numbness in any part of your body.  You are confused.  Your coordination gets worse.  You vomit repeatedly.  You are sleepier than normal.  Your speech is slurred.  You cannot recognize people or places.  You have a seizure.  It is difficult to wake you up.  You have unusual behavior changes.  You have changes in your vision.  You lose consciousness. Summary  A concussion is a brain injury that results from a hard, direct hit (trauma) to your head or body.  You may have imaging tests and neuropsychological tests to diagnose a concussion.  Treatment for this condition includes physical and mental rest and careful observation.  Ask your health care provider when you can return to your normal activities, such as school, work, athletics, and driving.  Get help right away if you have a severe headache, weakness on one side of the body, seizures, behavior changes, changes in vision, or if you are confused or sleepier than normal. This information is not intended to replace advice given to you by your health care provider. Make sure you discuss any questions you have with your health care provider. Document Revised: 06/21/2018 Document Reviewed: 06/21/2018 Elsevier Patient Education  2020 [employer].
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
## 2020-05-13 NOTE — Therapy (Signed)
[facility] Physical Therapy [address] [place], [place], [address] Phone: [phone]   Fax:  [phone]  Physical Therapy Treatment  Patient Details  Name: Suzanne Jarvis MRN: [id number] Date of Birth: 03-11-1991 Referring Provider (PT): Tarry Kos, MD    Encounter Date: 05/13/2020   PT End of Session - 05/13/20 1051    Visit Number 2    Number of Visits 12    Date for PT Re-Evaluation 06/10/20    Progress Note Due on Visit 10    PT Start Time 1100    PT Stop Time 1140    PT Time Calculation (min) 40 min    Activity Tolerance Patient tolerated treatment well    Behavior During Therapy [facility] for tasks assessed/performed           Past Medical History:  Diagnosis Date  . Angio-edema   . Asthma   . Chronic headaches   . Eczema   . IBS (irritable bowel syndrome)   . Tonsillitis   . Urticaria     History reviewed. No pertinent surgical history.  There were no vitals filed for this visit.   Subjective Assessment - 05/13/20 1104    Subjective Pt. stated pain still present in neck.  5/10.    Limitations Sitting;Standing    How long can you sit comfortably? > 1 hour    How long can you stand comfortably? standing most of the day - up to 3 hours    Diagnostic tests xrays negative    Patient Stated Goals improve pain    Currently in Pain? Yes    Pain Location Neck    Pain Orientation Posterior    Pain Descriptors / Indicators Other (Comment)   pulling   Pain Type Acute pain    Pain Onset 1 to 4 weeks ago    Pain Frequency Constant    Aggravating Factors  sleeping    Pain Relieving Factors unsure at this time.    Effect of Pain on Daily Activities work difficulty    Pain Score 0    Pain Location Back    Pain Type Acute pain    Pain Onset 1 to 4 weeks ago    Aggravating Factors  standing at work long day    Pain Relieving Factors resting    Effect of Pain on Daily Activities work                             [place]  Adult PT Treatment/Exercise - 05/13/20 0001      Neck Exercises: Machines for Strengthening   UBE (Upper Arm Bike) Fwd UE/LE lvl 2.5 5 mins, rev UE lvl 2 2 mins      Neck Exercises: Seated   Neck Retraction 15 reps    Other Seated Exercise seated bilateral UE ER, scap retraction 2 x 10      Lumbar Exercises: Stretches   Lower Trunk Rotation 5 reps;Other (comment)   15 sec x 5 bilateral     Lumbar Exercises: Supine   Bridge 20 reps      Manual Therapy   Manual therapy comments STM, compression to paraspinals lower cervical.  cPA, regional PA upper/mid thoracic g3            Trigger Point Dry Needling - 05/13/20 0001    Consent Given? Yes    Education Handout Provided Yes    Muscles Treated Head and Neck Semispinalis capitus;Cervical multifidi  Semispinalis capitus Response Twitch reponse elicited    Cervical multifidi Response Twitch reponse elicited                  PT Short Term Goals - 05/13/20 1051      PT SHORT TERM GOAL #1   Title independent with initial HEP    Status On-going    Target Date 05/20/20             PT Long Term Goals - 04/29/20 1313      PT LONG TERM GOAL #1   Title independent with final HEP for home and community fitness    Status New    Target Date 06/10/20      PT LONG TERM GOAL #2   Title Bil cervical rotation improved to at least 70 degrees for improved driving tolerance    Status New    Target Date 06/10/20      PT LONG TERM GOAL #3   Title report pain < 4/10 with full work day for improved function    Status New    Target Date 06/10/20      PT LONG TERM GOAL #4   Title report 50% improvement in lying down and sleep quality for improved quality of life    Status New    Target Date 06/10/20                 Plan - 05/13/20 1139    Clinical Impression Statement Good tolerance to manual intervention for cervicothoracic mobility as myofascial relief techniques.  Continued skilled PT services indicated to  progress activity tolerance, improve myofascial mobility to return to PLOF.    Personal Factors and Comorbidities Comorbidity 2    Comorbidities asthma, headaches    Examination-Activity Limitations Stand;Lift;Reach Overhead;Sit;Sleep;Carry    Examination-Participation Restrictions Other;Community Activity   occupation   Stability/Clinical Decision Making Stable/Uncomplicated    Rehab Potential Good    PT Frequency 2x / week    PT Duration 6 weeks    PT Treatment/Interventions ADLs/Self Care Home Management;Cryotherapy;Electrical Stimulation;Ultrasound;Traction;Moist Heat;Functional mobility training;Therapeutic activities;Therapeutic exercise;Patient/family education;Neuromuscular re-education;Iontophoresis 4mg /ml Dexamethasone;Passive range of motion;Dry needling;Taping    PT Next Visit Plan DN prn, postural strengthening, progressive mobility gains.    PT Home Exercise Plan Access Code:    Consulted and Agree with Plan of Care Patient           Patient will benefit from skilled therapeutic intervention in order to improve the following deficits and impairments:  Increased fascial restricitons, Increased muscle spasms, Pain, Postural dysfunction, Decreased range of motion, Decreased strength, Impaired flexibility  Visit Diagnosis: Cervicalgia  Acute midline low back pain without sciatica  Abnormal posture  Other symptoms and signs involving the musculoskeletal system     Problem List Patient Active Problem List   Diagnosis Date Noted  . Allergic conjunctivitis of both eyes 04/28/2020  . Urticaria 04/28/2020  . Penicillin allergy 04/28/2020  . ASCUS with positive high risk HPV cervical 03/26/2019  . Allergies 10/19/2018  . IBS (irritable bowel syndrome) 09/30/2015  . Erythema migrans (Lyme disease) 07/15/2015  . Vitamin B 12 deficiency 12/10/2014  . Other allergic rhinitis 02/12/2009    04/14/2009, PT, DPT, OCS, ATC 05/13/20  11:41 AM  . [facility] Physical Therapy [address] [place], [place], [place] Phone: [phone]   Fax:  [phone]  Name: Suzanne Jarvis MRN: [name] Date of Birth: [birth year]

## 2020-05-20 ENCOUNTER — Other Ambulatory Visit: Payer: Self-pay

## 2020-05-20 ENCOUNTER — Ambulatory Visit (INDEPENDENT_AMBULATORY_CARE_PROVIDER_SITE_OTHER): Payer: 59 | Admitting: Psychology

## 2020-05-20 ENCOUNTER — Encounter: Payer: Self-pay | Admitting: Physical Therapy

## 2020-05-20 ENCOUNTER — Ambulatory Visit (INDEPENDENT_AMBULATORY_CARE_PROVIDER_SITE_OTHER): Payer: 59 | Admitting: Physical Therapy

## 2020-05-20 ENCOUNTER — Ambulatory Visit (INDEPENDENT_AMBULATORY_CARE_PROVIDER_SITE_OTHER): Payer: 59

## 2020-05-20 DIAGNOSIS — F4322 Adjustment disorder with anxiety: Secondary | ICD-10-CM | POA: Diagnosis not present

## 2020-05-20 DIAGNOSIS — M542 Cervicalgia: Secondary | ICD-10-CM

## 2020-05-20 DIAGNOSIS — J309 Allergic rhinitis, unspecified: Secondary | ICD-10-CM | POA: Diagnosis not present

## 2020-05-20 DIAGNOSIS — M545 Low back pain, unspecified: Secondary | ICD-10-CM

## 2020-05-20 DIAGNOSIS — R29898 Other symptoms and signs involving the musculoskeletal system: Secondary | ICD-10-CM

## 2020-05-20 DIAGNOSIS — R293 Abnormal posture: Secondary | ICD-10-CM | POA: Diagnosis not present

## 2020-05-20 NOTE — Progress Notes (Signed)
Immunotherapy   Patient Details  Name: Suzanne Jarvis MRN: [id number] Date of Birth: 06-Dec-1990  05/20/2020  [name] here to start her allergy injections. Patient received 0.05 ml of both blue vialds with an expiration of 05/04/2021. One with G-W-RW-T and the other with M-C-D-CR-DM. Patient waited 30 minutes in an exam room with no problems. Following schedule: B Frequency: Once weekly Epi-Pen: Yes Consent signed and patient instructions given.   Dub Mikes 05/20/2020, 8:53 AM

## 2020-05-20 NOTE — Therapy (Signed)
[facility] Physical Therapy [address] [place], [place], [address] Phone: [phone]   Fax:  [phone]  Physical Therapy Treatment  Patient Details  Name: Suzanne Jarvis MRN: [id number] Date of Birth: November 17, 1990 Referring Provider (PT): Tarry Kos, MD    Encounter Date: 05/20/2020   PT End of Session - 05/20/20 1228    Visit Number 3    Number of Visits 12    Date for PT Re-Evaluation 06/10/20    Progress Note Due on Visit 10    PT Start Time 1123    PT Stop Time 1203    PT Time Calculation (min) 40 min    Activity Tolerance Patient tolerated treatment well    Behavior During Therapy [facility] for tasks assessed/performed           Past Medical History:  Diagnosis Date  . Angio-edema   . Asthma   . Chronic headaches   . Eczema   . IBS (irritable bowel syndrome)   . Tonsillitis   . Urticaria     History reviewed. No pertinent surgical history.  There were no vitals filed for this visit.   Subjective Assessment - 05/20/20 1123    Subjective feels the same, still having headaches and neck pain.    Limitations Sitting;Standing    How long can you sit comfortably? > 1 hour    How long can you stand comfortably? standing most of the day - up to 3 hours    Diagnostic tests xrays negative    Patient Stated Goals improve pain    Currently in Pain? Yes    Pain Score 4     Pain Location Neck    Pain Orientation Right    Pain Descriptors / Indicators Aching    Pain Type Acute pain    Pain Onset 1 to 4 weeks ago    Pain Frequency Constant    Aggravating Factors  sleeping    Pain Relieving Factors unsure    Pain Onset 1 to 4 weeks ago                             [facility] Adult PT Treatment/Exercise - 05/20/20 1126      Neck Exercises: Machines for Strengthening   UBE (Upper Arm Bike) L2 x 6 min, 3 min each direction      Neck Exercises: Theraband   Horizontal ABduction 20 reps;Blue    Horizontal ABduction Limitations  supine      Neck Exercises: Prone   Neck Retraction --   3x10   W Back --   3x10     Manual Therapy   Manual Therapy Soft tissue mobilization    Soft tissue mobilization STM to suboccipitals, cervical paraspinals, upper traps; lateral glides R -> L grades 2-3      Neck Exercises: Stretches   Other Neck Stretches mid doorway stretch 3x30 sec                    PT Short Term Goals - 05/20/20 1228      PT SHORT TERM GOAL #1   Title independent with initial HEP    Status Achieved    Target Date 05/20/20             PT Long Term Goals - 04/29/20 1313      PT LONG TERM GOAL #1   Title independent with final HEP for home and community fitness  Status New    Target Date 06/10/20      PT LONG TERM GOAL #2   Title Bil cervical rotation improved to at least 70 degrees for improved driving tolerance    Status New    Target Date 06/10/20      PT LONG TERM GOAL #3   Title report pain < 4/10 with full work day for improved function    Status New    Target Date 06/10/20      PT LONG TERM GOAL #4   Title report 50% improvement in lying down and sleep quality for improved quality of life    Status New    Target Date 06/10/20                 Plan - 05/20/20 1228    Clinical Impression Statement Pt reports continued pain without significant change following DN sessions.  Encouraged daily use of her TENS unit, and continue with HEP.  She has new dx of concussion, and now on new medication.  Will need to give medication time to work to see how this impacts neck and headache.  Will continue to benefit from PT to maximize function.    Personal Factors and Comorbidities Comorbidity 2    Comorbidities asthma, headaches    Examination-Activity Limitations Stand;Lift;Reach Overhead;Sit;Sleep;Carry    Examination-Participation Restrictions Other;Community Activity   occupation   Stability/Clinical Decision Making Stable/Uncomplicated    Rehab Potential Good    PT  Frequency 2x / week    PT Duration 6 weeks    PT Treatment/Interventions ADLs/Self Care Home Management;Cryotherapy;Electrical Stimulation;Ultrasound;Traction;Moist Heat;Functional mobility training;Therapeutic activities;Therapeutic exercise;Patient/family education;Neuromuscular re-education;Iontophoresis 4mg /ml Dexamethasone;Passive range of motion;Dry needling;Taping    PT Next Visit Plan DN prn, postural strengthening, progressive mobility gains.    PT Home Exercise Plan Access Code:    Consulted and Agree with Plan of Care Patient           Patient will benefit from skilled therapeutic intervention in order to improve the following deficits and impairments:  Increased fascial restricitons, Increased muscle spasms, Pain, Postural dysfunction, Decreased range of motion, Decreased strength, Impaired flexibility  Visit Diagnosis: Cervicalgia  Acute midline low back pain without sciatica  Abnormal posture  Other symptoms and signs involving the musculoskeletal system     Problem List Patient Active Problem List   Diagnosis Date Noted  . Allergic conjunctivitis of both eyes 04/28/2020  . Urticaria 04/28/2020  . Penicillin allergy 04/28/2020  . ASCUS with positive high risk HPV cervical 03/26/2019  . Allergies 10/19/2018  . IBS (irritable bowel syndrome) 09/30/2015  . Erythema migrans (Lyme disease) 07/15/2015  . Vitamin B 12 deficiency 12/10/2014  . Other allergic rhinitis 02/12/2009       04/14/2009, PT, DPT 05/20/20 12:31 PM    [facility] Physical Therapy [address] [place], [place], [place] Phone: [phone]   Fax:  [phone]  Name: Suzanne Jarvis MRN: [name] Date of Birth: 1991-06-13

## 2020-05-21 ENCOUNTER — Encounter: Payer: Self-pay | Admitting: Family Medicine

## 2020-05-21 MED ORDER — TRAZODONE HCL 50 MG PO TABS
50.0000 mg | ORAL_TABLET | Freq: Every day | ORAL | 1 refills | Status: DC
Start: 2020-05-21 — End: 2020-07-08

## 2020-05-26 ENCOUNTER — Ambulatory Visit (INDEPENDENT_AMBULATORY_CARE_PROVIDER_SITE_OTHER): Payer: 59

## 2020-05-26 DIAGNOSIS — J309 Allergic rhinitis, unspecified: Secondary | ICD-10-CM

## 2020-05-27 ENCOUNTER — Ambulatory Visit (INDEPENDENT_AMBULATORY_CARE_PROVIDER_SITE_OTHER): Payer: 59 | Admitting: Physical Therapy

## 2020-05-27 ENCOUNTER — Other Ambulatory Visit: Payer: Self-pay

## 2020-05-27 ENCOUNTER — Encounter: Payer: Self-pay | Admitting: Physical Therapy

## 2020-05-27 ENCOUNTER — Ambulatory Visit (INDEPENDENT_AMBULATORY_CARE_PROVIDER_SITE_OTHER): Payer: 59 | Admitting: Family Medicine

## 2020-05-27 ENCOUNTER — Encounter: Payer: Self-pay | Admitting: Family Medicine

## 2020-05-27 VITALS — BP 108/80 | HR 81 | Ht 65.0 in | Wt 133.8 lb

## 2020-05-27 DIAGNOSIS — M545 Low back pain, unspecified: Secondary | ICD-10-CM

## 2020-05-27 DIAGNOSIS — R29898 Other symptoms and signs involving the musculoskeletal system: Secondary | ICD-10-CM

## 2020-05-27 DIAGNOSIS — S060X0A Concussion without loss of consciousness, initial encounter: Secondary | ICD-10-CM

## 2020-05-27 DIAGNOSIS — R293 Abnormal posture: Secondary | ICD-10-CM

## 2020-05-27 DIAGNOSIS — M542 Cervicalgia: Secondary | ICD-10-CM

## 2020-05-27 NOTE — Therapy (Signed)
[place], [place], [address] Phone: [phone]   Fax:  [phone]  Physical Therapy Treatment  Patient Details  Name: Suzanne Jarvis MRN: [id number] Date of Birth: [birth year] Referring Provider (PT): Leandrew Koyanagi, MD    Encounter Date: 05/27/2020   PT End of Session - 05/27/20 1056    Visit Number 4    Number of Visits 12    Date for PT Re-Evaluation 06/10/20    Progress Note Due on Visit 10    PT Start Time 0930    PT Stop Time 1000    PT Time Calculation (min) 30 min    Activity Tolerance Patient tolerated treatment well    Behavior During Therapy [facility] for tasks assessed/performed           Past Medical History:  Diagnosis Date  . Angio-edema   . Asthma   . Chronic headaches   . Eczema   . IBS (irritable bowel syndrome)   . Tonsillitis   . Urticaria     History reviewed. No pertinent surgical history.  There were no vitals filed for this visit.   Subjective Assessment - 05/27/20 0929    Subjective went to they gym on Monday so she's sore but feels her neck pain is getting better.  headaches are less frequent, and overall sleeping better.    Limitations Sitting;Standing    How long can you sit comfortably? > 1 hour    How long can you stand comfortably? standing most of the day - up to 3 hours    Diagnostic tests xrays negative    Patient Stated Goals improve pain    Currently in Pain? No/denies    Pain Score 0-No pain    Pain Onset 1 to 4 weeks ago    Pain Onset 1 to 4 weeks ago              [facility] PT Assessment - 05/27/20 0956      Assessment   Medical Diagnosis Neck pain, Bilateral low back pain    Referring Provider (PT) Leandrew Koyanagi, MD       AROM   Cervical - Right Rotation 76    Cervical - Left Rotation 17                         [facility] Adult PT Treatment/Exercise - 05/27/20 0932      Neck Exercises: Machines for Strengthening   UBE (Upper Arm Bike) L2.5 x 6  min, 3 min each direction    Cybex Row 25# 3x10    Cybex Chest Press 25# 3x10    Lat Pull 25# 3x10    Other Machines for Strengthening bicep curls 10# 3x10    Other Machines for Strengthening overhead press 5# each 3x10                    PT Short Term Goals - 05/20/20 1228      PT SHORT TERM GOAL #1   Title independent with initial HEP    Status Achieved    Target Date 05/20/20             PT Long Term Goals - 05/27/20 1058      PT LONG TERM GOAL #1   Title independent with final HEP for home and community fitness    Baseline 7/14: initiated gym program today    Status On-going    Target Date  06/10/20      PT LONG TERM GOAL #2   Title Bil cervical rotation improved to at least 70 degrees for improved driving tolerance    Baseline 7/14: met to Rt; 67 degrees on Lt    Status Partially Met    Target Date 06/10/20      PT LONG TERM GOAL #3   Title report pain < 4/10 with full work day for improved function    Status Achieved      PT LONG TERM GOAL #4   Title report 50% improvement in lying down and sleep quality for improved quality of life    Status Achieved                 Plan - 05/27/20 1058    Clinical Impression Statement Pt overall doing much better and feels pain and motion greatly improved.  All goals have demonstrated progress and has met 2 LTGs at this time.  Recommended continued gradual return to community fitness.  May not need additional visits.    Personal Factors and Comorbidities Comorbidity 2    Comorbidities asthma, headaches    Examination-Activity Limitations Stand;Lift;Reach Overhead;Sit;Sleep;Carry    Examination-Participation Restrictions Other;Community Activity   occupation   Stability/Clinical Decision Making Stable/Uncomplicated    Rehab Potential Good    PT Frequency 2x / week    PT Duration 6 weeks    PT Treatment/Interventions ADLs/Self Care Home Management;Cryotherapy;Electrical Stimulation;Ultrasound;Traction;Moist  Heat;Functional mobility training;Therapeutic activities;Therapeutic exercise;Patient/family education;Neuromuscular re-education;Iontophoresis '4mg'$ /ml Dexamethasone;Passive range of motion;Dry needling;Taping    PT Next Visit Plan see how she's doing, any changes in pain over past week, if she doesn't need [place] d/c in 30 days.    PT Home Exercise Plan Access Code: [email or web address]    Consulted and Agree with Plan of Care Patient           Patient will benefit from skilled therapeutic intervention in order to improve the following deficits and impairments:  Increased fascial restricitons, Increased muscle spasms, Pain, Postural dysfunction, Decreased range of motion, Decreased strength, Impaired flexibility  Visit Diagnosis: Cervicalgia  Acute midline low back pain without sciatica  Abnormal posture  Other symptoms and signs involving the musculoskeletal system     Problem List Patient Active Problem List   Diagnosis Date Noted  . Allergic conjunctivitis of both eyes 04/28/2020  . Urticaria 04/28/2020  . Penicillin allergy 04/28/2020  . ASCUS with positive high risk HPV cervical 03/26/2019  . Allergies 10/19/2018  . IBS (irritable bowel syndrome) 09/30/2015  . Erythema migrans (Lyme disease) 07/15/2015  . Vitamin B 12 deficiency 12/10/2014  . Other allergic rhinitis 02/12/2009        Laureen Abrahams, PT, DPT 05/27/20 11:01 AM     [facility] Physical Therapy [address] [place], [place], [address] Phone: [phone]   Fax:  [phone]  Name: Suzanne Jarvis MRN: [id number] Date of Birth: 1991/04/16

## 2020-05-27 NOTE — Patient Instructions (Signed)
Thank you for coming in today.  Plan to continue trazodone as needed.  Advance activity as tolerated.  Recheck in about 6 weeks  Ok to return sooner if needed.
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
## 2020-06-02 ENCOUNTER — Ambulatory Visit (INDEPENDENT_AMBULATORY_CARE_PROVIDER_SITE_OTHER): Payer: 59 | Admitting: Psychology

## 2020-06-02 DIAGNOSIS — F4312 Post-traumatic stress disorder, chronic: Secondary | ICD-10-CM

## 2020-06-03 ENCOUNTER — Other Ambulatory Visit: Payer: Self-pay

## 2020-06-03 ENCOUNTER — Ambulatory Visit (INDEPENDENT_AMBULATORY_CARE_PROVIDER_SITE_OTHER): Payer: 59 | Admitting: Rehabilitative and Restorative Service Providers"

## 2020-06-03 ENCOUNTER — Ambulatory Visit (INDEPENDENT_AMBULATORY_CARE_PROVIDER_SITE_OTHER): Payer: 59

## 2020-06-03 ENCOUNTER — Encounter: Payer: Self-pay | Admitting: Rehabilitative and Restorative Service Providers"

## 2020-06-03 DIAGNOSIS — R29898 Other symptoms and signs involving the musculoskeletal system: Secondary | ICD-10-CM

## 2020-06-03 DIAGNOSIS — J309 Allergic rhinitis, unspecified: Secondary | ICD-10-CM

## 2020-06-03 DIAGNOSIS — M545 Low back pain, unspecified: Secondary | ICD-10-CM

## 2020-06-03 DIAGNOSIS — M542 Cervicalgia: Secondary | ICD-10-CM | POA: Diagnosis not present

## 2020-06-03 DIAGNOSIS — R293 Abnormal posture: Secondary | ICD-10-CM

## 2020-06-03 NOTE — Therapy (Signed)
[facility] Physical Therapy [address] [place], [place], [address] Phone: [phone]   Fax:  [phone]  Physical Therapy Treatment/Discharge  Patient Details  Name: Suzanne Jarvis MRN: [id number] Date of Birth: 05-Jun-1991 Referring Provider (PT): Leandrew Koyanagi, MD    Encounter Date: 06/03/2020   PT End of Session - 06/03/20 0926    Visit Number 5    Number of Visits 12    Date for PT Re-Evaluation 06/10/20    Progress Note Due on Visit 10    PT Start Time 0925    PT Stop Time 0950    PT Time Calculation (min) 25 min    Activity Tolerance Patient tolerated treatment well    Behavior During Therapy [facility] for tasks assessed/performed           Past Medical History:  Diagnosis Date   Angio-edema    Asthma    Chronic headaches    Eczema    IBS (irritable bowel syndrome)    Tonsillitis    Urticaria     History reviewed. No pertinent surgical history.  There were no vitals filed for this visit.   Subjective Assessment - 06/03/20 0928    Subjective Pt. indicated no pain today.  Feeling well and ready for d/c.    Limitations Sitting;Standing    How long can you sit comfortably? > 1 hour    How long can you stand comfortably? standing most of the day - up to 3 hours    Diagnostic tests xrays negative    Patient Stated Goals improve pain    Currently in Pain? No/denies    Pain Score 0-No pain    Pain Onset 1 to 4 weeks ago    Pain Score 0    Pain Onset 1 to 4 weeks ago              [facility] PT Assessment - 06/03/20 0001      Assessment   Medical Diagnosis Neck pain, Bilateral low back pain    Referring Provider (PT) Leandrew Koyanagi, MD     Onset Date/Surgical Date 04/09/20    Hand Dominance Right      AROM   Cervical - Right Rotation 72    Cervical - Left Rotation 73                         [facility] Adult PT Treatment/Exercise - 06/03/20 0001      Neck Exercises: Machines for Strengthening   UBE (Upper Arm Bike)  Lvl 2.5 3 mins fwd/back each way    Cybex Row 25# 3x10    Cybex Chest Press 25# 3x10    Lat Pull 25# 3x10                    PT Short Term Goals - 05/20/20 1228      PT SHORT TERM GOAL #1   Title independent with initial HEP    Status Achieved    Target Date 05/20/20             PT Long Term Goals - 06/03/20 0931      PT LONG TERM GOAL #1   Title independent with final HEP for home and community fitness    Status Achieved      PT LONG TERM GOAL #2   Title Bil cervical rotation improved to at least 70 degrees for improved driving tolerance    Baseline --  Status Achieved      PT LONG TERM GOAL #3   Title report pain < 4/10 with full work day for improved function    Status Achieved      PT LONG TERM GOAL #4   Title report 50% improvement in lying down and sleep quality for improved quality of life    Status Achieved                 Plan - 06/03/20 0929    Clinical Impression Statement Pt. has attended 5 visits overall during course of treatment.  Pt. has shown good overall improvement and reported reduction of symptoms and return to PLOF.  Pt. recommended for d/c c HEP at this time.  Pt. understanding and on board with plan at this time.    Personal Factors and Comorbidities Comorbidity 2    Comorbidities asthma, headaches    Examination-Activity Limitations Stand;Lift;Reach Overhead;Sit;Sleep;Carry    Examination-Participation Restrictions Other;Community Activity   occupation   Stability/Clinical Decision Making Stable/Uncomplicated    Rehab Potential Good    PT Frequency 2x / week    PT Duration 6 weeks    PT Treatment/Interventions ADLs/Self Care Home Management;Cryotherapy;Electrical Stimulation;Ultrasound;Traction;Moist Heat;Functional mobility training;Therapeutic activities;Therapeutic exercise;Patient/family education;Neuromuscular re-education;Iontophoresis '4mg'$ /ml Dexamethasone;Passive range of motion;Dry needling;Taping    PT Next Visit  Plan D/C to HEP    PT Home Exercise Plan Access Code: [email or web address]    Consulted and Agree with Plan of Care Patient           Patient will benefit from skilled therapeutic intervention in order to improve the following deficits and impairments:  Increased fascial restricitons, Increased muscle spasms, Pain, Postural dysfunction, Decreased range of motion, Decreased strength, Impaired flexibility  Visit Diagnosis: Cervicalgia  Acute midline low back pain without sciatica  Abnormal posture  Other symptoms and signs involving the musculoskeletal system     Problem List Patient Active Problem List   Diagnosis Date Noted   Allergic conjunctivitis of both eyes 04/28/2020   Urticaria 04/28/2020   Penicillin allergy 04/28/2020   ASCUS with positive high risk HPV cervical 03/26/2019   Allergies 10/19/2018   IBS (irritable bowel syndrome) 09/30/2015   Erythema migrans (Lyme disease) 07/15/2015   Vitamin B 12 deficiency 12/10/2014   Other allergic rhinitis 02/12/2009    PHYSICAL THERAPY DISCHARGE SUMMARY  Visits from Start of Care: 5  Current functional level related to goals / functional outcomes: See note   Remaining deficits: See note   Education / Equipment: HEP Plan: Patient agrees to discharge.  Patient goals were met. Patient is being discharged due to meeting the stated rehab goals.  ?????    Scot Jun, PT, DPT, OCS, ATC 06/03/20  9:49 AM    [facility] Physical Therapy [address] [place], [place], [address] Phone: [phone]   Fax:  [phone]  Name: Suzanne Jarvis MRN: [id number] Date of Birth: [birth year]

## 2020-06-10 ENCOUNTER — Ambulatory Visit (INDEPENDENT_AMBULATORY_CARE_PROVIDER_SITE_OTHER): Payer: 59

## 2020-06-10 DIAGNOSIS — J309 Allergic rhinitis, unspecified: Secondary | ICD-10-CM

## 2020-06-15 ENCOUNTER — Ambulatory Visit (INDEPENDENT_AMBULATORY_CARE_PROVIDER_SITE_OTHER): Payer: 59

## 2020-06-15 ENCOUNTER — Ambulatory Visit: Payer: Self-pay | Admitting: *Deleted

## 2020-06-15 DIAGNOSIS — J309 Allergic rhinitis, unspecified: Secondary | ICD-10-CM

## 2020-06-23 ENCOUNTER — Ambulatory Visit (INDEPENDENT_AMBULATORY_CARE_PROVIDER_SITE_OTHER): Payer: 59

## 2020-06-23 DIAGNOSIS — J309 Allergic rhinitis, unspecified: Secondary | ICD-10-CM | POA: Diagnosis not present

## 2020-06-24 ENCOUNTER — Ambulatory Visit (INDEPENDENT_AMBULATORY_CARE_PROVIDER_SITE_OTHER): Payer: 59 | Admitting: Psychology

## 2020-06-24 DIAGNOSIS — F4312 Post-traumatic stress disorder, chronic: Secondary | ICD-10-CM | POA: Diagnosis not present

## 2020-06-27 ENCOUNTER — Other Ambulatory Visit: Payer: Self-pay | Admitting: Family Medicine

## 2020-06-30 ENCOUNTER — Ambulatory Visit (INDEPENDENT_AMBULATORY_CARE_PROVIDER_SITE_OTHER): Payer: 59

## 2020-06-30 DIAGNOSIS — J309 Allergic rhinitis, unspecified: Secondary | ICD-10-CM | POA: Diagnosis not present

## 2020-07-06 ENCOUNTER — Other Ambulatory Visit: Payer: Self-pay | Admitting: Family Medicine

## 2020-07-06 ENCOUNTER — Telehealth: Payer: Self-pay

## 2020-07-06 DIAGNOSIS — K580 Irritable bowel syndrome with diarrhea: Secondary | ICD-10-CM

## 2020-07-06 NOTE — Telephone Encounter (Signed)
A user error has taken place: encounter opened in error, closed for administrative reasons.

## 2020-07-08 ENCOUNTER — Ambulatory Visit (INDEPENDENT_AMBULATORY_CARE_PROVIDER_SITE_OTHER): Payer: 59 | Admitting: Psychology

## 2020-07-08 ENCOUNTER — Other Ambulatory Visit: Payer: Self-pay

## 2020-07-08 ENCOUNTER — Ambulatory Visit (INDEPENDENT_AMBULATORY_CARE_PROVIDER_SITE_OTHER): Payer: 59 | Admitting: *Deleted

## 2020-07-08 ENCOUNTER — Ambulatory Visit (INDEPENDENT_AMBULATORY_CARE_PROVIDER_SITE_OTHER): Payer: 59 | Admitting: Family Medicine

## 2020-07-08 ENCOUNTER — Encounter: Payer: Self-pay | Admitting: Family Medicine

## 2020-07-08 DIAGNOSIS — F4312 Post-traumatic stress disorder, chronic: Secondary | ICD-10-CM

## 2020-07-08 DIAGNOSIS — Z87820 Personal history of traumatic brain injury: Secondary | ICD-10-CM | POA: Insufficient documentation

## 2020-07-08 DIAGNOSIS — S060X0D Concussion without loss of consciousness, subsequent encounter: Secondary | ICD-10-CM | POA: Diagnosis not present

## 2020-07-08 DIAGNOSIS — F988 Other specified behavioral and emotional disorders with onset usually occurring in childhood and adolescence: Secondary | ICD-10-CM | POA: Diagnosis not present

## 2020-07-08 DIAGNOSIS — J309 Allergic rhinitis, unspecified: Secondary | ICD-10-CM

## 2020-07-08 DIAGNOSIS — S060X0A Concussion without loss of consciousness, initial encounter: Secondary | ICD-10-CM

## 2020-07-08 HISTORY — DX: Personal history of traumatic brain injury: Z87.820

## 2020-07-08 HISTORY — DX: Other specified behavioral and emotional disorders with onset usually occurring in childhood and adolescence: F98.8

## 2020-07-08 HISTORY — DX: Concussion without loss of consciousness, initial encounter: S06.0X0A

## 2020-07-08 MED ORDER — TRAZODONE HCL 50 MG PO TABS
50.0000 mg | ORAL_TABLET | Freq: Every day | ORAL | 1 refills | Status: DC
Start: 2020-07-08 — End: 2020-08-05

## 2020-07-08 MED ORDER — AMPHETAMINE-DEXTROAMPHET ER 20 MG PO CP24
20.0000 mg | ORAL_CAPSULE | Freq: Every day | ORAL | 0 refills | Status: DC
Start: 2020-07-08 — End: 2020-08-05
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
## 2020-07-08 NOTE — Patient Instructions (Signed)
Thank you for coming in today. Continue trazodone.  Start adderall xr 20mg  daily.  Recheck in 1 month.  Let me know if this is not working well or you have problems.   Amphetamine; Dextroamphetamine extended-release capsules What is this medicine? AMPHETAMINE; DEXTROAMPHETAMINE (am FET a meen; dex troe am FET a meen) is used to treat attention-deficit hyperactivity disorder (ADHD). Federal law prohibits giving this medicine to any person other than the person for whom it was prescribed. Do not share this medicine with anyone else. This medicine may be used for other purposes; ask your health care provider or pharmacist if you have questions. COMMON BRAND NAME(S): Adderall XR, Mydayis What should I tell my health care provider before I take this medicine? They need to know if you have any of these conditions:  anxiety or panic attacks  circulation problems in fingers and toes  glaucoma  hardening or blockages of the arteries or heart blood vessels  heart disease or a heart defect  high blood pressure  history of a drug or alcohol abuse problem  history of stroke  kidney disease  liver disease  mental illness  seizures  suicidal thoughts, plans, or attempt; a previous suicide attempt by you or a family member  thyroid disease  Tourette's syndrome  an unusual or allergic reaction to dextroamphetamine, other amphetamines, other medicines, foods, dyes, or preservatives  pregnant or trying to get pregnant  breast-feeding How should I use this medicine? Take this medicine by mouth with a glass of water. Follow the directions on the prescription label. This medicine is taken just one time per day, usually in the morning after waking up. Take with or without food. Do not chew or crush this medicine. You may open the capsules and sprinkle the medicine on a spoonful of applesauce. If sprinkled on applesauce, take the dose immediately and do not crush or chew. Do not take your  medicine more often than directed. A special MedGuide will be given to you by the pharmacist with each prescription and refill. Be sure to read this information carefully each time. Talk to your pediatrician regarding the use of this medicine in children. While this drug may be prescribed for children as young as 6 years for selected conditions, precautions do apply. Some extended-release capsules are recommended for use only in children 33 years of age and older. Overdosage: If you think you have taken too much of this medicine contact a poison control center or emergency room at once. NOTE: This medicine is only for you. Do not share this medicine with others. What if I miss a dose? If you miss a dose, take it as soon as you can in the morning, but do not take it later in the day because it can cause trouble sleeping. If it is almost time for your next dose, take only that dose. Do not take double or extra doses. What may interact with this medicine? Do not take this medicine with any of the following medications:  MAOIs like Carbex, Eldepryl, Marplan, Nardil, and Parnate  other stimulant medicines for attention disorders This medicine may also interact with the following medications:  acetazolamide  alcohol  ammonium chloride  antacids  ascorbic acid  atomoxetine  caffeine  certain medicines for blood pressure  certain medicines for depression, anxiety, or psychotic disturbances  certain medicines for seizures like carbamazepine, phenobarbital, phenytoin  certain medicines for stomach problems like cimetidine, ranitidine, famotidine, esomeprazole, omeprazole, lansoprazole, pantoprazole  lithium  medicines for colds  and breathing difficulties  medicines for diabetes  medicines or dietary supplements for weight loss or to stay awake  methenamine  narcotic medicines for pain  quinidine  ritonavir  sodium bicarbonate  St. John's wort This list may not describe  all possible interactions. Give your health care provider a list of all the medicines, herbs, non-prescription drugs, or dietary supplements you use. Also tell them if you smoke, drink alcohol, or use illegal drugs. Some items may interact with your medicine. What should I watch for while using this medicine? Visit your doctor or health care professional for regular checks on your progress. This prescription requires that you follow special procedures with your doctor and pharmacy. You will need to have a new written prescription from your doctor every time you need a refill. This medicine may affect your concentration, or hide signs of tiredness. Until you know how this medicine affects you, do not drive, ride a bicycle, use machinery, or do anything that needs mental alertness. Alcohol should be avoided with some brands of this medicine. Talk to your doctor or health care professional if you have questions. Tell your doctor or health care professional if this medicine loses its effects, or if you feel you need to take more than the prescribed amount. Do not change the dosage without talking to your doctor or health care professional. Decreased appetite is a common side effect when starting this medicine. Eating small, frequent meals or snacks can help. Talk to your doctor if you continue to have poor eating habits. Height and weight growth of a child taking this medicine will be monitored closely. Do not take this medicine close to bedtime. It may prevent you from sleeping. If you are going to need surgery, an MRI, a CT scan, or other procedure, tell your doctor that you are taking this medicine. You may need to stop taking this medicine before the procedure. Tell your doctor or healthcare professional right away if you notice unexplained wounds on your fingers and toes while taking this medicine. You should also tell your healthcare provider if you experience numbness or pain, changes in the skin color,  or sensitivity to temperature in your fingers or toes. What side effects may I notice from receiving this medicine? Side effects that you should report to your doctor or health care professional as soon as possible:  allergic reactions like skin rash, itching or hives, swelling of the face, lips, or tongue  anxious  breathing problems  changes in emotions or moods  changes in vision  chest pain or chest tightness  fast, irregular heartbeat  fingers or toes feel numb, cool, painful  hallucination, loss of contact with reality  high blood pressure  males: prolonged or painful erection  seizures  signs and symptoms of serotonin syndrome like confusion, increased sweating, fever, tremor, stiff muscles, diarrhea  signs and symptoms of a stroke like changes in vision; confusion; trouble speaking or understanding; severe headaches; sudden numbness or weakness of the face, arm or leg; trouble walking; dizziness; loss of balance or coordination  suicidal thoughts or other mood changes  uncontrollable head, mouth, neck, arm, or leg movements Side effects that usually do not require medical attention (report to your doctor or health care professional if they continue or are bothersome):  dry mouth  headache  irritability  loss of appetite  nausea  trouble sleeping  weight loss This list may not describe all possible side effects. Call your doctor for medical advice about side effects.  You may report side effects to FDA at 1-800-FDA-1088. Where should I keep my medicine? Keep out of the reach of children. This medicine can be abused. Keep your medicine in a safe place to protect it from theft. Do not share this medicine with anyone. Selling or giving away this medicine is dangerous and against the law. Store at room temperature between 15 and 30 degrees C (59 and 86 degrees F). Keep container tightly closed. Protect from light. Throw away any unused medicine after the  expiration date. NOTE: This sheet is a summary. It may not cover all possible information. If you have questions about this medicine, talk to your doctor, pharmacist, or health care provider.  2020 Elsevier/Gold Standard (2016-12-25 13:37:27)

## 2020-07-14 ENCOUNTER — Ambulatory Visit (INDEPENDENT_AMBULATORY_CARE_PROVIDER_SITE_OTHER): Payer: 59

## 2020-07-14 DIAGNOSIS — J309 Allergic rhinitis, unspecified: Secondary | ICD-10-CM | POA: Diagnosis not present

## 2020-07-14 DIAGNOSIS — J3089 Other allergic rhinitis: Secondary | ICD-10-CM

## 2020-07-21 ENCOUNTER — Ambulatory Visit (INDEPENDENT_AMBULATORY_CARE_PROVIDER_SITE_OTHER): Payer: 59

## 2020-07-21 DIAGNOSIS — J309 Allergic rhinitis, unspecified: Secondary | ICD-10-CM | POA: Diagnosis not present

## 2020-07-22 ENCOUNTER — Ambulatory Visit (INDEPENDENT_AMBULATORY_CARE_PROVIDER_SITE_OTHER): Payer: 59 | Admitting: Psychology

## 2020-07-22 DIAGNOSIS — F4312 Post-traumatic stress disorder, chronic: Secondary | ICD-10-CM

## 2020-07-28 DIAGNOSIS — J302 Other seasonal allergic rhinitis: Secondary | ICD-10-CM | POA: Insufficient documentation

## 2020-07-28 DIAGNOSIS — H1010 Acute atopic conjunctivitis, unspecified eye: Secondary | ICD-10-CM

## 2020-07-28 HISTORY — DX: Other seasonal allergic rhinitis: H10.10

## 2020-07-28 HISTORY — DX: Other seasonal allergic rhinitis: J30.2

## 2020-07-28 NOTE — Progress Notes (Signed)
Follow Up Note  RE: Suzanne Jarvis MRN: [id number] DOB: December 07, 1990 Date of Office Visit: 07/29/2020  Referring provider: Natalia Leatherwood, DO Primary care provider: Natalia Leatherwood, DO  Chief Complaint: Allergic Rhinitis   History of Present Illness: I had the pleasure of seeing Suzanne Jarvis for a follow up visit at the [facility] on 07/29/2020. She is a 29 y.o. female, who is being followed for allergic rhinoconjunctivitis on AIT, urticaria and penicillin allergy. Her previous allergy office visit was on 04/28/2020 with Dr. Selena Batten. Today is a regular follow up visit.  Allergic rhino conjunctivitis Patient started on allergy injections and had some large localized reaction initially. Now doing better with additional antihistamines.  Currently taking Claritin in the morning and Xyzal, Singulair at night.  Takes Flonase 1 spray per nostril in AM and azelastine as needed. Using eye drops in the mornings.   Symptoms flared when she was outdoors for a long time.   Urticaria No hives since last visit.   Assessment and Plan: Xia is a 29 y.o. female with: Seasonal and perennial allergic rhinoconjunctivitis Past history - Rhino conjunctivitis symptoms from the spring through fall for the past 5 years. 2 sinus infections per year. 2021 skin testing showed: Positive to grass, weed, ragweed, trees, cat, mold, dog, dust mites. Borderline to cockroach.  Negative to common foods.  Interim history - started AIT on 05/20/2020 (G-W-RW-T and M-C-D-CR-DM) and tolerating it well. Initially had some large localized reactions which improved with increased antihistamines.  Continue allergy injections - injection given today.   Continue environmental control measures as below.  May use over the counter antihistamines such as Zyrtec (cetirizine), Claritin (loratadine), Allegra (fexofenadine), or Xyzal (levocetirizine) daily as needed. May take twice a day if needed. Continue with  Singulair (montelukast) 10mg  daily at night.  May use olopatadine eye drops 0.1% twice a day as needed for itchy/watery eyes.  May use Flonase 1 spray per nostril twice a day for nasal congestion.  May use azelastine 1-2 sprays per nostril twice a day as needed for runny nose.  Nasal saline spray (i.e., Simply Saline) or nasal saline lavage (i.e., NeilMed) is recommended as needed and prior to medicated nasal sprays.  Urticaria Past history - hives in the past with no known triggers.  Monitor symptoms. If has reoccurrence will re-assess then.   Penicillin allergy Past history - Broke out in hives after taking penicillin.  Continue avoidance. Consider penicillin testing in future.   Return in about 6 months (around 01/26/2021).  Diagnostics: None.  Medication List:  Current Outpatient Medications  Medication Sig Dispense Refill  . amphetamine-dextroamphetamine (ADDERALL XR) 20 MG 24 hr capsule Take 1 capsule (20 mg total) by mouth daily. 30 capsule 0  . ascorbic acid (VITAMIN C) 500 MG tablet Take by mouth.    . Azelastine HCl 0.15 % SOLN Place 1-2 sprays into the nose 2 (two) times daily as needed (runny nose). 30 mL 5  . Biotin 01/28/2021 MCG TABS Take by mouth. 5,000 MCG daily    . Cyanocobalamin (B-12) 1000 MCG TABS Take 2 tablets by mouth daily.    . diclofenac (VOLTAREN) 75 MG EC tablet Take 1 tablet (75 mg total) by mouth 2 (two) times daily. 60 tablet 1  . dicyclomine (BENTYL) 10 MG capsule TAKE 1 CAPSULE BY MOUTH FOUR TIMES DAILY BEFORE MEALS AND AT BEDTIME 120 capsule 3  . diphenoxylate-atropine (LOMOTIL) 2.5-0.025 MG tablet TAKE 1 TABLET BY MOUTH FOUR  TIMES DAILY AS NEEDED FOR DIARRHEA OR LOOSE STOOLS 180 tablet 1  . drospirenone-ethinyl estradiol (YAZ,GIANVI,LORYNA) 3-0.02 MG tablet Take 1 tablet by mouth daily.      Marland Kitchen EPINEPHrine (AUVI-Q) 0.3 mg/0.3 mL IJ SOAJ injection Inject 0.3 mLs (0.3 mg total) into the muscle as needed for anaphylaxis. 1 each 1  . fluticasone  (FLONASE) 50 MCG/ACT nasal spray Place into both nostrils daily.    Marland Kitchen levocetirizine (XYZAL) 5 MG tablet Take 1 tablet (5 mg total) by mouth every evening. 90 tablet 3  . loratadine (CLARITIN) 10 MG tablet Take 10 mg by mouth daily.    . Magnesium 100 MG TABS Take by mouth.    . montelukast (SINGULAIR) 10 MG tablet Take 1 tablet (10 mg total) by mouth at bedtime. 90 tablet 3  . Multiple Vitamins-Minerals (MULTIVITAMIN WITH MINERALS) tablet Take 1 tablet by mouth daily.    Marland Kitchen olopatadine (PATANOL) 0.1 % ophthalmic solution Place 1 drop into both eyes 2 (two) times daily. 5 mL 2  . traZODone (DESYREL) 50 MG tablet Take 1 tablet (50 mg total) by mouth at bedtime. FOR SLEEP 90 tablet 1  . Vitamin D, Cholecalciferol, 25 MCG (1000 UT) CAPS Take by mouth.     No current facility-administered medications for this visit.   Allergies: Allergies  Allergen Reactions  . Amoxicillin Hives  . Hyoscyamine Other (See Comments)  . Mixed Grasses   . Other      grass, weed, ragweed, trees, cat, mold, dog, dust mites. Borderline to cockroach.    . Paxil [Paroxetine Hcl]     Dizziness   . Penicillins     REACTION: hives   I reviewed her past medical history, social history, family history, and environmental history and no significant changes have been reported from her previous visit.  Review of Systems  Constitutional: Negative for appetite change, chills, fever and unexpected weight change.  HENT: Positive for congestion, postnasal drip, rhinorrhea and sneezing.   Eyes: Positive for itching.  Respiratory: Negative for cough, chest tightness, shortness of breath and wheezing.   Cardiovascular: Negative for chest pain.  Gastrointestinal: Negative for abdominal pain.  Genitourinary: Negative for difficulty urinating.  Skin: Negative for rash.  Allergic/Immunologic: Positive for environmental allergies. Negative for food allergies.  Neurological: Negative for headaches.   Objective: BP 108/72    Pulse 99   Temp 98.7 F (37.1 C) (Temporal)   Resp 16   SpO2 99%  There is no height or weight on file to calculate BMI. Physical Exam Vitals and nursing note reviewed.  Constitutional:      Appearance: Normal appearance. She is well-developed and normal weight.  HENT:     Head: Normocephalic and atraumatic.     Right Ear: Tympanic membrane and external ear normal.     Left Ear: Tympanic membrane and external ear normal.     Nose: Nose normal.     Mouth/Throat:     Mouth: Mucous membranes are moist.     Pharynx: Oropharynx is clear.  Eyes:     Conjunctiva/sclera: Conjunctivae normal.  Cardiovascular:     Rate and Rhythm: Normal rate and regular rhythm.     Heart sounds: Normal heart sounds. No murmur heard.  No friction rub. No gallop.   Pulmonary:     Effort: Pulmonary effort is normal.     Breath sounds: Normal breath sounds. No wheezing, rhonchi or rales.  Abdominal:     Palpations: Abdomen is soft.  Musculoskeletal:  Cervical back: Neck supple.  Skin:    General: Skin is warm.     Findings: No rash.  Neurological:     Mental Status: She is alert and oriented to person, place, and time.  Psychiatric:        Mood and Affect: Mood normal.        Behavior: Behavior normal.    Previous notes and tests were reviewed. The plan was reviewed with the patient/family, and all questions/concerned were addressed.  It was my pleasure to see Saharah today and participate in her care. Please feel free to contact me with any questions or concerns.  Sincerely,  Wyline Mood, DO Allergy & Immunology  [facility] office: [phone] [facility] office: [phone] [place] office: [phone]

## 2020-07-29 ENCOUNTER — Encounter: Payer: Self-pay | Admitting: Allergy

## 2020-07-29 ENCOUNTER — Ambulatory Visit: Payer: Self-pay | Admitting: *Deleted

## 2020-07-29 ENCOUNTER — Other Ambulatory Visit: Payer: Self-pay

## 2020-07-29 ENCOUNTER — Ambulatory Visit (INDEPENDENT_AMBULATORY_CARE_PROVIDER_SITE_OTHER): Payer: 59 | Admitting: Allergy

## 2020-07-29 VITALS — BP 108/72 | HR 99 | Temp 98.7°F | Resp 16

## 2020-07-29 DIAGNOSIS — J3089 Other allergic rhinitis: Secondary | ICD-10-CM

## 2020-07-29 DIAGNOSIS — L509 Urticaria, unspecified: Secondary | ICD-10-CM

## 2020-07-29 DIAGNOSIS — J302 Other seasonal allergic rhinitis: Secondary | ICD-10-CM | POA: Diagnosis not present

## 2020-07-29 DIAGNOSIS — H1010 Acute atopic conjunctivitis, unspecified eye: Secondary | ICD-10-CM

## 2020-07-29 DIAGNOSIS — Z880 Allergy status to penicillin: Secondary | ICD-10-CM

## 2020-07-29 DIAGNOSIS — J309 Allergic rhinitis, unspecified: Secondary | ICD-10-CM

## 2020-07-29 NOTE — Assessment & Plan Note (Signed)
Past history - hives in the past with no known triggers.  Monitor symptoms. If has reoccurrence will re-assess then.

## 2020-07-29 NOTE — Patient Instructions (Addendum)
Environmental allergies  2021 skin testing was Positive to grass, weed, ragweed, trees, cat, mold, dog, dust mites. Borderline to cockroach.   Continue allergy injections.   Continue nvironmental control measures as below.  May use over the counter antihistamines such as Zyrtec (cetirizine), Claritin (loratadine), Allegra (fexofenadine), or Xyzal (levocetirizine) daily as needed. May take twice a day if needed. Continue with Singulair (montelukast) 10mg  daily at night.  May use olopatadine eye drops 0.1% twice a day as needed for itchy/watery eyes.  May use Flonase 1 spray per nostril twice a day for nasal congestion.  May use azelastine 1-2 sprays per nostril twice a day as needed for runny nose.  Nasal saline spray (i.e., Simply Saline) or nasal saline lavage (i.e., NeilMed) is recommended as needed and prior to medicated nasal sprays.  Follow up in 6 months or sooner if needed.   Reducing Pollen Exposure . Pollen seasons: trees (spring), grass (summer) and ragweed/weeds (fall). 03-23-1981 Keep windows closed in your home and car to lower pollen exposure.  Marland Kitchen air conditioning in the bedroom and throughout the house if possible.  . Avoid going out in dry windy days - especially early morning. . Pollen counts are highest between 5 - 10 AM and on dry, hot and windy days.  . Save outside activities for late afternoon or after a heavy rain, when pollen levels are lower.  . Avoid mowing of grass if you have grass pollen allergy. Lilian Kapur Be aware that pollen can also be transported indoors on people and pets.  . Dry your clothes in an automatic dryer rather than hanging them outside where they might collect pollen.  . Rinse hair and eyes before bedtime.  Pet Allergen Avoidance: . Contrary to popular opinion, there are no "hypoallergenic" breeds of dogs or cats. That is because people are not allergic to an animal's hair, but to an allergen found in the animal's saliva, dander (dead skin flakes)  or urine. Pet allergy symptoms typically occur within minutes. For some people, symptoms can build up and become most severe 8 to 12 hours after contact with the animal. People with severe allergies can experience reactions in public places if dander has been transported on the pet owners' clothing. Marland Kitchen Keeping an animal outdoors is only a partial solution, since homes with pets in the yard still have higher concentrations of animal allergens. . Before getting a pet, ask your allergist to determine if you are allergic to animals. If your pet is already considered part of your family, try to minimize contact and keep the pet out of the bedroom and other rooms where you spend a great deal of time. . As with dust mites, vacuum carpets often or replace carpet with a hardwood floor, tile or linoleum. . High-efficiency particulate air (HEPA) cleaners can reduce allergen levels over time. . While dander and saliva are the source of cat and dog allergens, urine is the source of allergens from rabbits, hamsters, mice and Marland Kitchen pigs; so ask a non-allergic family member to clean the animal's cage. . If you have a pet allergy, talk to your allergist about the potential for allergy immunotherapy (allergy shots). This strategy can often provide long-term relief. Mold Control . Mold and fungi can grow on a variety of surfaces provided certain temperature and moisture conditions exist.  . Outdoor molds grow on plants, decaying vegetation and soil. The major outdoor mold, Alternaria and Cladosporium, are found in very high numbers during hot and dry conditions. Generally, a  late summer - fall peak is seen for common outdoor fungal spores. Rain will temporarily lower outdoor mold spore count, but counts rise rapidly when the rainy period ends. . The most important indoor molds are Aspergillus and Penicillium. Dark, humid and poorly ventilated basements are ideal sites for mold growth. The next most common sites of mold  growth are the bathroom and the kitchen. Outdoor (Seasonal) Mold Control . Use air conditioning and keep windows closed. . Avoid exposure to decaying vegetation. Marland Kitchen Avoid leaf raking. . Avoid grain handling. . Consider wearing a face mask if working in moldy areas.  Indoor (Perennial) Mold Control  . Maintain humidity below 50%. . Get rid of mold growth on hard surfaces with water, detergent and, if necessary, 5% bleach (do not mix with other cleaners). Then dry the area completely. If mold covers an area more than 10 square feet, consider hiring an indoor environmental professional. . For clothing, washing with soap and water is best. If moldy items cannot be cleaned and dried, throw them away. . Remove sources e.g. contaminated carpets. . Repair and seal leaking roofs or pipes. Using dehumidifiers in damp basements may be helpful, but empty the water and clean units regularly to prevent mildew from forming. All rooms, especially basements, bathrooms and kitchens, require ventilation and cleaning to deter mold and mildew growth. Avoid carpeting on concrete or damp floors, and storing items in damp areas. Control of House Dust Mite Allergen . Dust mite allergens are a common trigger of allergy and asthma symptoms. While they can be found throughout the house, these microscopic creatures thrive in warm, humid environments such as bedding, upholstered furniture and carpeting. . Because so much time is spent in the bedroom, it is essential to reduce mite levels there.  . Encase pillows, mattresses, and box springs in special allergen-proof fabric covers or airtight, zippered plastic covers.  . Bedding should be washed weekly in hot water (130 F) and dried in a hot dryer. Allergen-proof covers are available for comforters and pillows that can't be regularly washed.  Reyes Ivan the allergy-proof covers every few months. Minimize clutter in the bedroom. Keep pets out of the bedroom.  Marland Kitchen Keep humidity less  than 50% by using a dehumidifier or air conditioning. You can buy a humidity measuring device called a hygrometer to monitor this.  . If possible, replace carpets with hardwood, linoleum, or washable area rugs. If that's not possible, vacuum frequently with a vacuum that has a HEPA filter. . Remove all upholstered furniture and non-washable window drapes from the bedroom. . Remove all non-washable stuffed toys from the bedroom.  Wash stuffed toys weekly. Cockroach Allergen Avoidance Cockroaches are often found in the homes of densely populated urban areas, schools or commercial buildings, but these creatures can lurk almost anywhere. This does not mean that you have a dirty house or living area. . Block all areas where roaches can enter the home. This includes crevices, wall cracks and windows.  . Cockroaches need water to survive, so fix and seal all leaky faucets and pipes. Have an exterminator go through the house when your family and pets are gone to eliminate any remaining roaches. Marland Kitchen Keep food in lidded containers and put pet food dishes away after your pets are done eating. Vacuum and sweep the floor after meals, and take out garbage and recyclables. Use lidded garbage containers in the kitchen. Wash dishes immediately after use and clean under stoves, refrigerators or toasters where crumbs can accumulate. Wipe  off the stove and other kitchen surfaces and cupboards regularly.  Skin care recommendations  Bath time: . Always use lukewarm water. AVOID very hot or cold water. Marland Kitchen Keep bathing time to 5-10 minutes. . Do NOT use bubble bath. . Use a mild soap and use just enough to wash the dirty areas. . Do NOT scrub skin vigorously.  . After bathing, pat dry your skin with a towel. Do NOT rub or scrub the skin.  Moisturizers and prescriptions:  . ALWAYS apply moisturizers immediately after bathing (within 3 minutes). This helps to lock-in moisture. . Use the moisturizer several times a day over  the whole body. Peri Jefferson summer moisturizers include: Aveeno, CeraVe, Cetaphil. Peri Jefferson winter moisturizers include: Aquaphor, Vaseline, Cerave, Cetaphil, Eucerin, Vanicream. . When using moisturizers along with medications, the moisturizer should be applied about one hour after applying the medication to prevent diluting effect of the medication or moisturize around where you applied the medications. When not using medications, the moisturizer can be continued twice daily as maintenance.  Laundry and clothing: . Avoid laundry products with added color or perfumes. . Use unscented hypo-allergenic laundry products such as Tide free, Cheer free & gentle, and All free and clear.  . If the skin still seems dry or sensitive, you can try double-rinsing the clothes. . Avoid tight or scratchy clothing such as wool. . Do not use fabric softeners or dyer sheets.

## 2020-07-29 NOTE — Assessment & Plan Note (Signed)
Past history - Broke out in hives after taking penicillin.  Continue avoidance. Consider penicillin testing in future.  

## 2020-07-29 NOTE — Assessment & Plan Note (Signed)
Past history - Rhino conjunctivitis symptoms from the spring through fall for the past 5 years. 2 sinus infections per year. 2021 skin testing showed: Positive to grass, weed, ragweed, trees, cat, mold, dog, dust mites. Borderline to cockroach.  Negative to common foods.  Interim history - started AIT on 05/20/2020 (G-W-RW-T and M-C-D-CR-DM) and tolerating it well. Initially had some large localized reactions which improved with increased antihistamines.  Continue allergy injections - injection given today.   Continue environmental control measures as below.  May use over the counter antihistamines such as Zyrtec (cetirizine), Claritin (loratadine), Allegra (fexofenadine), or Xyzal (levocetirizine) daily as needed. May take twice a day if needed. Continue with Singulair (montelukast) 10mg  daily at night.  May use olopatadine eye drops 0.1% twice a day as needed for itchy/watery eyes.  May use Flonase 1 spray per nostril twice a day for nasal congestion.  May use azelastine 1-2 sprays per nostril twice a day as needed for runny nose.  Nasal saline spray (i.e., Simply Saline) or nasal saline lavage (i.e., NeilMed) is recommended as needed and prior to medicated nasal sprays.

## 2020-08-05 ENCOUNTER — Ambulatory Visit (INDEPENDENT_AMBULATORY_CARE_PROVIDER_SITE_OTHER): Payer: 59 | Admitting: Family Medicine

## 2020-08-05 ENCOUNTER — Telehealth: Payer: Self-pay | Admitting: Family Medicine

## 2020-08-05 ENCOUNTER — Ambulatory Visit (INDEPENDENT_AMBULATORY_CARE_PROVIDER_SITE_OTHER): Payer: 59

## 2020-08-05 ENCOUNTER — Encounter: Payer: Self-pay | Admitting: Family Medicine

## 2020-08-05 VITALS — BP 110/84 | HR 95 | Ht 65.0 in | Wt 133.4 lb

## 2020-08-05 DIAGNOSIS — F0781 Postconcussional syndrome: Secondary | ICD-10-CM | POA: Diagnosis not present

## 2020-08-05 DIAGNOSIS — F988 Other specified behavioral and emotional disorders with onset usually occurring in childhood and adolescence: Secondary | ICD-10-CM | POA: Diagnosis not present

## 2020-08-05 DIAGNOSIS — J309 Allergic rhinitis, unspecified: Secondary | ICD-10-CM

## 2020-08-05 MED ORDER — AMPHETAMINE-DEXTROAMPHET ER 20 MG PO CP24
20.0000 mg | ORAL_CAPSULE | Freq: Every day | ORAL | 0 refills | Status: DC
Start: 2020-08-05 — End: 2020-10-14

## 2020-08-05 MED ORDER — TRAZODONE HCL 50 MG PO TABS: 50.0000 mg | ORAL_TABLET | Freq: Every day | ORAL | 1 refills | Status: DC

## 2020-08-05 NOTE — Patient Instructions (Addendum)
Thank you for coming in today.  Continue adderall and trazodone.  I will ask Dr Claiborne Billings if she will take adderall if not I will refer to ADHD specialists or psychiatry.   Recheck as needed.

## 2020-08-05 NOTE — Telephone Encounter (Signed)
-----   Message from Natalia Leatherwood, DO sent at 08/05/2020 12:45 PM EDT ----- Regarding: RE: Adderall Hello Yes we can take over management of both the trazodone and the Adderall.   Please have her follow-up when she would be due for refills on those medications.  I would encourage her to schedule a follow-up with [place] at least 2 weeks before running out medication.  Thanks! Renee ----- Message ----- From: Rodolph Bong, MD Sent: 08/05/2020  12:23 PM EDT To: Natalia Leatherwood, DO Subject: Adderall                                       I have been seeing Suzanne Jarvis for concussion.  At this point she is more postconcussion.  Currently she is pretty well managed with trazodone for insomnia and most recently Adderall for inattention and difficulty focusing.  I think it is pretty likely that she had ADHD prior to her concussion.   At this point things are pretty stabilized.  Do you feel comfortable taking over the Adderall prescription and trazodone prescription?  If not I plan to refer to [place] attention specialists.  I will continue to prescribe Adderall for 6 months if you are having trouble transitioning.  Clayburn Pert

## 2020-08-05 NOTE — Telephone Encounter (Signed)
I heard back from Dr. Claiborne Billings,  She will take over the trazodone and Adderall.  I sent in a 90-day supply of these medications.  I recommend that you schedule a follow-up appointment with Dr. Claiborne Billings to review Adderall and trazodone about 2-1/2 months from now which should be early December.  My advice is call her office and schedule this now.  Clayburn Pert
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
## 2020-08-06 NOTE — Telephone Encounter (Signed)
Contacted pt and relayed Dr. Zollie Pee message.  She verbalizes understanding and does not have any additional questions/concerns.

## 2020-08-11 ENCOUNTER — Ambulatory Visit (INDEPENDENT_AMBULATORY_CARE_PROVIDER_SITE_OTHER): Payer: 59 | Admitting: *Deleted

## 2020-08-11 DIAGNOSIS — J309 Allergic rhinitis, unspecified: Secondary | ICD-10-CM

## 2020-08-18 ENCOUNTER — Ambulatory Visit (INDEPENDENT_AMBULATORY_CARE_PROVIDER_SITE_OTHER): Payer: 59

## 2020-08-18 DIAGNOSIS — J309 Allergic rhinitis, unspecified: Secondary | ICD-10-CM | POA: Diagnosis not present

## 2020-08-25 ENCOUNTER — Telehealth: Payer: Self-pay | Admitting: Urology

## 2020-08-25 ENCOUNTER — Other Ambulatory Visit: Payer: Self-pay | Admitting: Urology

## 2020-08-25 MED ORDER — DOXYCYCLINE HYCLATE 100 MG PO TABS: 100.0000 mg | ORAL_TABLET | Freq: Two times a day (BID) | ORAL | 0 refills | Status: DC

## 2020-08-25 NOTE — Telephone Encounter (Signed)
I spoke with the patient by phone regarding right ear pain, popping and pressure ongoing for the past 4 days and progressively worsening.  These symptoms are similar to what she has had with previous ear infections which commonly occur after a significant flare of allergies which she has had due to the recent high rag weed levels. She denies fever or discharge from her ear. She reports itchy eyes and itching in her ears at the onset and now feels some scratchy discomfort on the right side of her throat. She called her PCP office for an appointment but could not be seen until next Wednesday and preferred not to go to an Urgent Care if not absolutely necessary due to risk for COVID-19 exposure. Therefore, I have offered to send a Rx for Doxycycine 100 mg po BID x 10 days to her pharmacy to cover AOM. She understands that should her symptoms not improve or should she develop fever, discharge or worsening pain despite treatment, she should present to an urgent care or ED for immediate evaluation. She is comfortable and in agreement with this plan.  Marguarite Arbour, MMS, PA-C [facility] Radiation Oncology Physician Assistant Direct Dial: [phone]  Fax: [phone]

## 2020-09-02 ENCOUNTER — Ambulatory Visit (INDEPENDENT_AMBULATORY_CARE_PROVIDER_SITE_OTHER): Payer: 59 | Admitting: Psychology

## 2020-09-02 DIAGNOSIS — F411 Generalized anxiety disorder: Secondary | ICD-10-CM

## 2020-09-07 ENCOUNTER — Ambulatory Visit (INDEPENDENT_AMBULATORY_CARE_PROVIDER_SITE_OTHER): Payer: 59 | Admitting: *Deleted

## 2020-09-07 DIAGNOSIS — J309 Allergic rhinitis, unspecified: Secondary | ICD-10-CM

## 2020-09-09 ENCOUNTER — Other Ambulatory Visit: Payer: Self-pay

## 2020-09-09 ENCOUNTER — Ambulatory Visit (INDEPENDENT_AMBULATORY_CARE_PROVIDER_SITE_OTHER): Payer: 59

## 2020-09-09 DIAGNOSIS — Z23 Encounter for immunization: Secondary | ICD-10-CM

## 2020-09-14 ENCOUNTER — Ambulatory Visit (INDEPENDENT_AMBULATORY_CARE_PROVIDER_SITE_OTHER): Payer: 59

## 2020-09-14 DIAGNOSIS — J309 Allergic rhinitis, unspecified: Secondary | ICD-10-CM | POA: Diagnosis not present

## 2020-09-16 ENCOUNTER — Ambulatory Visit (INDEPENDENT_AMBULATORY_CARE_PROVIDER_SITE_OTHER): Payer: 59 | Admitting: Psychology

## 2020-09-16 DIAGNOSIS — F4312 Post-traumatic stress disorder, chronic: Secondary | ICD-10-CM

## 2020-09-22 ENCOUNTER — Ambulatory Visit (INDEPENDENT_AMBULATORY_CARE_PROVIDER_SITE_OTHER): Payer: 59 | Admitting: *Deleted

## 2020-09-22 DIAGNOSIS — J309 Allergic rhinitis, unspecified: Secondary | ICD-10-CM | POA: Diagnosis not present

## 2020-09-29 ENCOUNTER — Ambulatory Visit (INDEPENDENT_AMBULATORY_CARE_PROVIDER_SITE_OTHER): Payer: 59

## 2020-09-29 DIAGNOSIS — J309 Allergic rhinitis, unspecified: Secondary | ICD-10-CM | POA: Diagnosis not present

## 2020-10-05 ENCOUNTER — Ambulatory Visit (INDEPENDENT_AMBULATORY_CARE_PROVIDER_SITE_OTHER): Payer: 59

## 2020-10-05 DIAGNOSIS — J309 Allergic rhinitis, unspecified: Secondary | ICD-10-CM

## 2020-10-13 ENCOUNTER — Ambulatory Visit (INDEPENDENT_AMBULATORY_CARE_PROVIDER_SITE_OTHER): Payer: 59

## 2020-10-13 DIAGNOSIS — J309 Allergic rhinitis, unspecified: Secondary | ICD-10-CM

## 2020-10-14 ENCOUNTER — Encounter: Payer: Self-pay | Admitting: Family Medicine

## 2020-10-14 ENCOUNTER — Ambulatory Visit (INDEPENDENT_AMBULATORY_CARE_PROVIDER_SITE_OTHER): Payer: 59 | Admitting: Family Medicine

## 2020-10-14 ENCOUNTER — Other Ambulatory Visit: Payer: Self-pay

## 2020-10-14 VITALS — BP 119/77 | HR 86 | Temp 98.9°F | Ht 65.0 in | Wt 129.0 lb

## 2020-10-14 DIAGNOSIS — Z79899 Other long term (current) drug therapy: Secondary | ICD-10-CM | POA: Diagnosis not present

## 2020-10-14 DIAGNOSIS — S060X0D Concussion without loss of consciousness, subsequent encounter: Secondary | ICD-10-CM | POA: Diagnosis not present

## 2020-10-14 DIAGNOSIS — F988 Other specified behavioral and emotional disorders with onset usually occurring in childhood and adolescence: Secondary | ICD-10-CM | POA: Diagnosis not present

## 2020-10-14 MED ORDER — AMPHETAMINE-DEXTROAMPHETAMINE 20 MG PO TABS
20.0000 mg | ORAL_TABLET | Freq: Every day | ORAL | 0 refills | Status: DC
Start: 2020-10-14 — End: 2021-01-13

## 2020-10-14 MED ORDER — TRAZODONE HCL 50 MG PO TABS: 50.0000 mg | ORAL_TABLET | Freq: Every day | ORAL | 1 refills | Status: DC

## 2020-10-14 NOTE — Patient Instructions (Addendum)
Next appt in 3 months.  I have refilled the medications for you. Taper them as we discussed.

## 2020-10-14 NOTE — Progress Notes (Signed)
This visit occurred during the SARS-CoV-2 public health emergency.  Safety protocols were in place, including screening questions prior to the visit, additional usage of staff PPE, and extensive cleaning of exam room while observing appropriate contact time as indicated for disinfecting solutions.    Suzanne Jarvis , 03/16/1991, 29 y.o., female MRN: [id number] Patient Care Team    Relationship Specialty Notifications Start End  Natalia Leatherwood, DO PCP - General Family Medicine  09/04/15   Rodell Perna, NP Nurse Practitioner Obstetrics and Gynecology  01/29/20     Chief Complaint  Patient presents with  . Follow-up    ADD     Subjective: Pt presents for an OV to follow-up status post concussion and sequela of insomnia and focus deficits.  She has been evaluated and followed by sports medicine for this condition.  Her condition is felt to be stable and is now referred back to primary care to take over management.  Patient reports she is sleeping well on trazodone 50 mg nightly.  She also feels her focus is much improved on Adderall 20 mg XR.  She is uncertain if she needs this high of a dose.  She also would like to hopefully not have to stay on trazodone or Adderall long-term.  She does admit they are both helpful with her condition.  Depression screen [facility] 2/9 10/14/2020 01/29/2020 11/20/2019  Decreased Interest 0 0 0  Down, Depressed, Hopeless 0 0 0  PHQ - 2 Score 0 0 0    Allergies  Allergen Reactions  . Amoxicillin Hives  . Hyoscyamine Other (See Comments)  . Mixed Grasses   . Other      grass, weed, ragweed, trees, cat, mold, dog, dust mites. Borderline to cockroach.    . Paxil [Paroxetine Hcl]     Dizziness   . Penicillins     REACTION: hives   Social History   Social History Narrative  . Not on file   Past Medical History:  Diagnosis Date  . ADD (attention deficit disorder) 07/08/2020  . Angio-edema   . Asthma   . Chronic headaches   . Concussion with no  loss of consciousness 07/08/2020   May 2021  . Eczema   . IBS (irritable bowel syndrome)   . Tonsillitis   . Urticaria    Past Surgical History:  Procedure Laterality Date  . WISDOM TOOTH EXTRACTION     Family History  Problem Relation Age of Onset  . Allergies Mother   . Asthma Mother   . Hypertension Mother   . Allergic rhinitis Mother   . Urticaria Mother   . Arthritis Maternal Grandmother   . Irritable bowel syndrome Maternal Grandmother   . Breast cancer Maternal Grandmother   . Allergic rhinitis Father   . Allergic rhinitis Sister   . Eczema Sister   . Lung cancer Other        paternal great grandfather  . Lung cancer Other        maternal great grandfather  . Cancer Other        Blood-great grandmother  . Colon cancer Neg Hx    Allergies as of 10/14/2020      Reactions   Amoxicillin Hives   Hyoscyamine Other (See Comments)   Mixed Grasses    Other     grass, weed, ragweed, trees, cat, mold, dog, dust mites. Borderline to cockroach.     Paxil [paroxetine Hcl]    Dizziness   Penicillins  REACTION: hives      Medication List       Accurate as of October 14, 2020 11:08 AM. If you have any questions, ask your nurse or doctor.        STOP taking these medications   amphetamine-dextroamphetamine 20 MG 24 hr capsule Commonly known as: Adderall XR Replaced by: amphetamine-dextroamphetamine 20 MG tablet Stopped by: Felix Pacini, DO   diclofenac 75 MG EC tablet Commonly known as: VOLTAREN Stopped by: Felix Pacini, DO   doxycycline 100 MG tablet Commonly known as: VIBRA-TABS Stopped by: Felix Pacini, DO     TAKE these medications   amphetamine-dextroamphetamine 20 MG tablet Commonly known as: Adderall Take 1 tablet (20 mg total) by mouth daily. Replaces: amphetamine-dextroamphetamine 20 MG 24 hr capsule Started by: Felix Pacini, DO   ascorbic acid 500 MG tablet Commonly known as: VITAMIN C Take by mouth.   Azelastine HCl 0.15 % Soln Place 1-2  sprays into the nose 2 (two) times daily as needed (runny nose).   B-12 1000 MCG Tabs Take 2 tablets by mouth daily.   Biotin 20254 MCG Tabs Take by mouth. 5,000 MCG daily   dicyclomine 10 MG capsule Commonly known as: BENTYL TAKE 1 CAPSULE BY MOUTH FOUR TIMES DAILY BEFORE MEALS AND AT BEDTIME   diphenoxylate-atropine 2.5-0.025 MG tablet Commonly known as: LOMOTIL TAKE 1 TABLET BY MOUTH FOUR TIMES DAILY AS NEEDED FOR DIARRHEA OR LOOSE STOOLS   drospirenone-ethinyl estradiol 3-0.02 MG tablet Commonly known as: YAZ Take 1 tablet by mouth daily.   EPINEPHrine 0.3 mg/0.3 mL Soaj injection Commonly known as: Auvi-Q Inject 0.3 mLs (0.3 mg total) into the muscle as needed for anaphylaxis.   fluticasone 50 MCG/ACT nasal spray Commonly known as: FLONASE Place into both nostrils daily.   levocetirizine 5 MG tablet Commonly known as: Xyzal Take 1 tablet (5 mg total) by mouth every evening.   loratadine 10 MG tablet Commonly known as: CLARITIN Take 10 mg by mouth daily.   Magnesium 100 MG Tabs Take by mouth.   montelukast 10 MG tablet Commonly known as: SINGULAIR Take 1 tablet (10 mg total) by mouth at bedtime.   multivitamin with minerals tablet Take 1 tablet by mouth daily.   olopatadine 0.1 % ophthalmic solution Commonly known as: Patanol Place 1 drop into both eyes 2 (two) times daily.   traZODone 50 MG tablet Commonly known as: DESYREL Take 1 tablet (50 mg total) by mouth at bedtime. FOR SLEEP   Vitamin D (Cholecalciferol) 25 MCG (1000 UT) Caps Take by mouth.       All past medical history, surgical history, allergies, family history, immunizations andmedications were updated in the EMR today and reviewed under the history and medication portions of their EMR.     ROS: Negative, with the exception of above mentioned in HPI   Objective:  BP 119/77   Pulse 86   Temp 98.9 F (37.2 C) (Oral)   Ht 5\' 5"  (1.651 m)   Wt 129 lb (58.5 kg)   SpO2 100%   BMI  21.47 kg/m  Body mass index is 21.47 kg/m. Gen: Afebrile. No acute distress. Nontoxic in appearance, well developed, well nourished.  HENT: AT. [place].  Eyes:Pupils Equal Round Reactive to light, Extraocular movements intact,  Conjunctiva without redness, discharge or icterus. CV: RRR Chest: CTAB, no wheeze or crackles. Good air movement, normal resp effort.   Neuro:  Normal gait. PERLA. EOMi. Alert. Oriented x3  Psych: Normal affect, dress and demeanor. Normal  speech. Normal thought content and judgment.  No exam data present No results found. No results found for this or any previous visit (from the past 24 hour(s)).  Assessment/Plan: LALONNIE SHAFFER is a 29 y.o. female present for OV for  Attention deficit disorder (ADD) without hyperactivity/Controlled substance agreement signed/history of concussion with focus deficit Stable. Discussed lowering dose of Adderall and trazodone. Continue trazodone 50 mg nightly, when she is desiring to try to cut back she will start first by taking half a tab nightly and then slowly tapering off if desired. Adderall 20 mg short acting twice daily prescribed today to help her and her desire to cut back on dosage.  She will start by taking 1 tab in the morning and a half a tab in the afternoon when she is ready to start cutting back.  If desiring to continue cut back she will cut back by 10 mg at a time. [place] controlled substance database reviewed today Controlled substance agreement signed today UDS completed today - DRUG MONITORING, PANEL 8 WITH CONFIRMATION, URINE Follow-up 3 months    Reviewed expectations re: course of current medical issues.  Discussed self-management of symptoms.  Outlined signs and symptoms indicating need for more acute intervention.  Patient verbalized understanding and all questions were answered.  Patient received an After-Visit Summary.    Orders Placed This Encounter  Procedures  . DRUG MONITORING,  PANEL 8 WITH CONFIRMATION, URINE   Meds ordered this encounter  Medications  . traZODone (DESYREL) 50 MG tablet    Sig: Take 1 tablet (50 mg total) by mouth at bedtime. FOR SLEEP    Dispense:  90 tablet    Refill:  1  . amphetamine-dextroamphetamine (ADDERALL) 20 MG tablet    Sig: Take 1 tablet (20 mg total) by mouth daily.    Dispense:  180 tablet    Refill:  0   Referral Orders  No referral(s) requested today     Note is dictated utilizing voice recognition software. Although note has been proof read prior to signing, occasional typographical errors still can be missed. If any questions arise, please do not hesitate to call for verification.   electronically signed by:  Felix Pacini, DO  [facility] - [place]

## 2020-10-15 ENCOUNTER — Encounter: Payer: Self-pay | Admitting: Family Medicine

## 2020-10-16 ENCOUNTER — Other Ambulatory Visit: Payer: Self-pay

## 2020-10-16 ENCOUNTER — Emergency Department
Admission: RE | Admit: 2020-10-16 | Discharge: 2020-10-16 | Disposition: A | Discharge status: Home / Self Care | Payer: 59 | Source: Ambulatory Visit

## 2020-10-16 VITALS — BP 130/87 | HR 91 | Temp 98.2°F

## 2020-10-16 DIAGNOSIS — J029 Acute pharyngitis, unspecified: Secondary | ICD-10-CM

## 2020-10-16 DIAGNOSIS — J039 Acute tonsillitis, unspecified: Secondary | ICD-10-CM | POA: Diagnosis not present

## 2020-10-16 LAB — DRUG MONITORING, PANEL 8 WITH CONFIRMATION, URINE
6 Acetylmorphine: NEGATIVE ng/mL (ref <10)
Alcohol Metabolites: NEGATIVE ng/mL
Amphetamine: 604 ng/mL — ABNORMAL HIGH (ref <250)
Amphetamines: POSITIVE ng/mL — AB (ref <500)
Benzodiazepines: NEGATIVE ng/mL (ref <100)
Buprenorphine, Urine: NEGATIVE ng/mL (ref <5)
Cocaine Metabolite: NEGATIVE ng/mL (ref <150)
Creatinine: 16.7 mg/dL
MDMA: NEGATIVE ng/mL (ref <500)
Marijuana Metabolite: NEGATIVE ng/mL (ref <20)
Methamphetamine: NEGATIVE ng/mL (ref <250)
Opiates: NEGATIVE ng/mL (ref <100)
Oxidant: NEGATIVE ug/mL
Oxycodone: NEGATIVE ng/mL (ref <100)
Specific Gravity: 1.002 — ABNORMAL LOW (ref >=1.0)
pH: 7.3 (ref 4.5–9.0)

## 2020-10-16 LAB — DM TEMPLATE

## 2020-10-16 LAB — POCT RAPID STREP A (OFFICE): Rapid Strep A Screen: NEGATIVE

## 2020-10-16 NOTE — ED Provider Notes (Signed)
[name]    CSN: [id number] Arrival date & time: 10/16/20  8099      History   Chief Complaint Chief Complaint  Patient presents with  . Sore Throat  . Appointment    HPI Suzanne Jarvis is a 29 y.o. female.   HPI Suzanne Jarvis is a 29 y.o. female presenting to UC with c/o itchy throat that started last night shortly after eating pizza, then woke this morning with a sore throat and white dots on her tonsils.  Hx of recurrent sinus infections and congestion. States she is on multiple allergy medications including Flonase.  Denies fever, chills, n/v/d. No HA or abdominal pain. No SOB.  No trouble swallowing fluids. No known sick contacts. No rash/hives.    Past Medical History:  Diagnosis Date  . ADD (attention deficit disorder) 07/08/2020  . Angio-edema   . Asthma   . Chronic headaches   . Concussion with no loss of consciousness 07/08/2020   May 2021  . Eczema   . IBS (irritable bowel syndrome)   . Tonsillitis   . Urticaria     Patient Active Problem List   Diagnosis Date Noted  . Seasonal and perennial allergic rhinoconjunctivitis 07/28/2020  . Concussion with no loss of consciousness 07/08/2020  . ADD (attention deficit disorder) 07/08/2020  . Urticaria 04/28/2020  . Penicillin allergy 04/28/2020  . ASCUS with positive high risk HPV cervical 03/26/2019  . Allergies 10/19/2018  . IBS (irritable bowel syndrome) 09/30/2015  . Erythema migrans (Lyme disease) 07/15/2015  . Vitamin B 12 deficiency 12/10/2014  . Other allergic rhinitis 02/12/2009    Past Surgical History:  Procedure Laterality Date  . WISDOM TOOTH EXTRACTION      OB History   No obstetric history on file.      Home Medications    Prior to Admission medications   Medication Sig Start Date End Date Taking? Authorizing Provider  amphetamine-dextroamphetamine (ADDERALL) 20 MG tablet Take 1 tablet (20 mg total) by mouth daily. 10/14/20   [name], [name], DO  ascorbic acid  (VITAMIN C) 500 MG tablet Take by mouth.    [provider]  Azelastine HCl 0.15 % SOLN Place 1-2 sprays into the nose 2 (two) times daily as needed (runny nose). 04/28/20   Ellamae Sia, DO  Biotin 83382 MCG TABS Take by mouth. 5,000 MCG daily    [provider]  Cyanocobalamin (B-12) 1000 MCG TABS Take 2 tablets by mouth daily.    [provider]  dicyclomine (BENTYL) 10 MG capsule TAKE 1 CAPSULE BY MOUTH FOUR TIMES DAILY BEFORE MEALS AND AT BEDTIME 06/29/20   [name], [name], DO  diphenoxylate-atropine (LOMOTIL) 2.5-0.025 MG tablet TAKE 1 TABLET BY MOUTH FOUR TIMES DAILY AS NEEDED FOR DIARRHEA OR LOOSE STOOLS 07/06/20   [name], [name], DO  drospirenone-ethinyl estradiol (YAZ,GIANVI,LORYNA) 3-0.02 MG tablet Take 1 tablet by mouth daily.      [provider]  EPINEPHrine (AUVI-Q) 0.3 mg/0.3 mL IJ SOAJ injection Inject 0.3 mLs (0.3 mg total) into the muscle as needed for anaphylaxis. 04/28/20   Ellamae Sia, DO  fluticasone (FLONASE) 50 MCG/ACT nasal spray Place into both nostrils daily.    [provider]  levocetirizine (XYZAL) 5 MG tablet Take 1 tablet (5 mg total) by mouth every evening. 11/20/19   [name], [name], DO  loratadine (CLARITIN) 10 MG tablet Take 10 mg by mouth daily.    [provider]  Magnesium 100 MG  TABS Take by mouth.    [provider]  montelukast (SINGULAIR) 10 MG tablet Take 1 tablet (10 mg total) by mouth at bedtime. 01/29/20   [name], [name], DO  Multiple Vitamins-Minerals (MULTIVITAMIN WITH MINERALS) tablet Take 1 tablet by mouth daily.    [provider]  olopatadine (PATANOL) 0.1 % ophthalmic solution Place 1 drop into both eyes 2 (two) times daily. 04/07/20   [name], [name], DO  traZODone (DESYREL) 50 MG tablet Take 1 tablet (50 mg total) by mouth at bedtime. FOR SLEEP 10/14/20   [name], [name], DO  Vitamin D, Cholecalciferol, 25 MCG (1000 UT) CAPS Take by mouth.    [provider]    Family  History Family History  Problem Relation Age of Onset  . Allergies Mother   . Asthma Mother   . Hypertension Mother   . Allergic rhinitis Mother   . Urticaria Mother   . Arthritis Maternal Grandmother   . Irritable bowel syndrome Maternal Grandmother   . Breast cancer Maternal Grandmother   . Allergic rhinitis Father   . Allergic rhinitis Sister   . Eczema Sister   . Lung cancer Other        paternal great grandfather  . Lung cancer Other        maternal great grandfather  . Cancer Other        Blood-great grandmother  . Colon cancer Neg Hx     Social History Social History   Tobacco Use  . Smoking status: Former Smoker    Packs/day: 0.25    Years: 6.00    Pack years: 1.50    Types: Cigarettes  . Smokeless tobacco: Never Used  Vaping Use  . Vaping Use: Some days  Substance Use Topics  . Alcohol use: Yes    Comment: rare  . Drug use: No     Allergies   Amoxicillin, Hyoscyamine, Mixed grasses, Other, Paxil [paroxetine hcl], and Penicillins   Review of Systems Review of Systems  Constitutional: Negative for chills and fever.  HENT: Positive for sore throat. Negative for congestion, ear pain, trouble swallowing and voice change.   Respiratory: Negative for cough and shortness of breath.   Cardiovascular: Negative for chest pain and palpitations.  Gastrointestinal: Negative for abdominal pain, diarrhea, nausea and vomiting.  Musculoskeletal: Negative for arthralgias, back pain and myalgias.  Skin: Negative for rash.  All other systems reviewed and are negative.    Physical Exam Triage Vital Signs ED Triage Vitals  Enc Vitals Group     BP 10/16/20 0953 130/87     Pulse Rate 10/16/20 0953 91     Resp --      Temp 10/16/20 0953 98.2 F (36.8 C)     Temp Source 10/16/20 0953 Oral     SpO2 10/16/20 0953 100 %     Weight --      Height --      Head Circumference --      Peak Flow --      Pain Score 10/16/20 0949 0     Pain Loc --      Pain Edu? --       Excl. in GC? --    No data found.  Updated Vital Signs BP 130/87 (BP Location: Left Arm)   Pulse 91   Temp 98.2 F (36.8 C) (Oral)   LMP 10/03/2020 (Approximate)   SpO2 100%   Visual Acuity Right Eye Distance:   Left Eye Distance:  Bilateral Distance:    Right Eye Near:   Left Eye Near:    Bilateral Near:     Physical Exam Vitals and nursing note reviewed.  Constitutional:      General: She is not in acute distress.    Appearance: She is well-developed. She is not ill-appearing, toxic-appearing or diaphoretic.  HENT:     Head: Normocephalic and atraumatic.     Right Ear: Tympanic membrane and ear canal normal.     Left Ear: Tympanic membrane and ear canal normal.     Nose: Nose normal.     Right Sinus: No maxillary sinus tenderness or frontal sinus tenderness.     Left Sinus: No maxillary sinus tenderness or frontal sinus tenderness.     Mouth/Throat:     Lips: Pink.     Mouth: Mucous membranes are moist.     Pharynx: Oropharynx is clear. Uvula midline. Posterior oropharyngeal erythema present. No pharyngeal swelling, oropharyngeal exudate or uvula swelling.     Tonsils: Tonsillar exudate present. No tonsillar abscesses.  Cardiovascular:     Rate and Rhythm: Normal rate and regular rhythm.  Pulmonary:     Effort: Pulmonary effort is normal. No respiratory distress.     Breath sounds: Normal breath sounds. No stridor. No wheezing, rhonchi or rales.  Musculoskeletal:        General: Normal range of motion.     Cervical back: Normal range of motion and neck supple.  Lymphadenopathy:     Cervical: No cervical adenopathy.  Skin:    General: Skin is warm and dry.  Neurological:     Mental Status: She is alert and oriented to person, place, and time.  Psychiatric:        Behavior: Behavior normal.      UC Treatments / Results  Labs (all labs ordered are listed, but only abnormal results are displayed) Labs Reviewed  STREP A DNA PROBE  POCT RAPID STREP A  (OFFICE)    EKG   Radiology No results found.  Procedures Procedures (including critical care time)  Medications Ordered in UC Medications - No data to display  Initial Impression / Assessment and Plan / UC Course  I have reviewed the triage vital signs and the nursing notes.  Pertinent labs & imaging results that were available during my care of the patient were reviewed by me and considered in my medical decision making (see chart for details).     Rapid strep: NEGATIVE Culture sent Encouraged symptomatic tx, f/u with PCP next week as needed Discussed symptoms that warrant emergent care in the ED. AVS given  Final Clinical Impressions(s) / UC Diagnoses   Final diagnoses:  Exudative tonsillitis  Pharyngitis, unspecified etiology     Discharge Instructions      You may take 500mg  acetaminophen every 4-6 hours or in combination with ibuprofen 400-600mg  every 6-8 hours as needed for pain, inflammation, and fever.  Be sure to well hydrated with clear liquids and get at least 8 hours of sleep at night, preferably more while sick.   Please follow up with family medicine in 1 week if needed.    Call 911 or have someone drive you to the hospital if symptoms significantly worsening including return of your hives, swelling of your face, lips or throat, trouble breathing, or other new concerning symptoms develop.      ED Prescriptions    None     PDMP not reviewed this encounter.   , PA-C 10/16/20  1934  

## 2020-10-16 NOTE — Discharge Instructions (Signed)
  You may take 500mg  acetaminophen every 4-6 hours or in combination with ibuprofen 400-600mg  every 6-8 hours as needed for pain, inflammation, and fever.  Be sure to well hydrated with clear liquids and get at least 8 hours of sleep at night, preferably more while sick.   Please follow up with family medicine in 1 week if needed.    Call 911 or have someone drive you to the hospital if symptoms significantly worsening including return of your hives, swelling of your face, lips or throat, trouble breathing, or other new concerning symptoms develop.

## 2020-10-16 NOTE — ED Triage Notes (Signed)
Pt co itchy throat after eating pizza last night and woke up with a sore throat, and red patches with white dots in throat and sore tonsils. Pt states she struggles with reoccurring sinus infections and has a lot of mucus production.

## 2020-10-18 LAB — STREP A DNA PROBE: Group A Strep Probe: NOT DETECTED

## 2020-10-20 ENCOUNTER — Ambulatory Visit (INDEPENDENT_AMBULATORY_CARE_PROVIDER_SITE_OTHER): Payer: 59

## 2020-10-20 DIAGNOSIS — J309 Allergic rhinitis, unspecified: Secondary | ICD-10-CM | POA: Diagnosis not present

## 2020-10-26 ENCOUNTER — Telehealth: Payer: Self-pay

## 2020-10-26 NOTE — Telephone Encounter (Signed)
Patient has scheduled appt with PCP on 12/15.  [facility] Day - Client TELEPHONE ADVICE RECORD AccessNurse Patient Name: Suzanne Jarvis Gender: Female DOB: 08-13-1991 Age: 29 Y 7 M 19 D Return Phone Number: [phone] (Primary) [place] [address] Client [facility] Day - Client Client Site [facility] [place] - Day Physician AA - PHYSICIAN, Crissie Figures- MD Contact Type Call Who Is Calling Patient / Member / Family / Caregiver Call Type Triage / Clinical Relationship To Patient Self Return Phone Number [phone] (Primary) Chief Complaint Nosebleed Reason for Call Symptomatic / Request for Health Information Initial Comment Caller wants to make an appointment for Wednesday. Caller has allergies and there is blood in her mucus with nosebleed. Translation No Nurse Assessment Nurse: Doylene Canard, RN, Lesa Date/Time (Eastern Time): 10/26/2020 9:21:30 AM Confirm and document reason for call. If symptomatic, describe symptoms. ---Caller states she has been getting nosebleeds. Does the patient have any new or worsening symptoms? ---Yes Will a triage be completed? ---Yes Related visit to physician within the last 2 weeks? ---No Does the PT have any chronic conditions? (i.e. diabetes, asthma, this includes High risk factors for pregnancy, etc.) ---Yes List chronic conditions. ---IBS, previous concussion, severe allergies Is the patient pregnant or possibly pregnant? (Ask all females between the ages of 8-55) ---No Is this a behavioral health or substance abuse call? ---No Guidelines Guideline Title Affirmed Question Affirmed Notes Nurse Date/Time (Eastern Time) Nosebleed Easy bleeding present in other family members Doylene Canard, Charity fundraiser, Lesa 10/26/2020 9:23:14 AM Disp. Time Lamount Cohen Time) Disposition Final User 10/26/2020 9:29:10 AM See PCP within 2 Weeks Yes Conner, RN, Gean Maidens Caller Disagree/Comply Comply Caller  Understands Yes PLEASE NOTE: All timestamps contained within this report are represented as [place] Standard Time. CONFIDENTIALTY NOTICE: This fax transmission is intended only for the addressee. It contains information that is legally privileged, confidential or otherwise protected from use or disclosure. If you are not the intended recipient, you are strictly prohibited from reviewing, disclosing, copying using or disseminating any of this information or taking any action in reliance on or regarding this information. If you have received this fax in error, please notify us immediately by telephone so that we can arrange for its return to [place]. Phone: [phone], Toll-Free: [phone], Fax: [phone] Page: 2 of 2 Call Id: [id number] PreDisposition Did not know what to do Care Advice Given Per Guideline SEE PCP WITHIN 2 WEEKS: * You need to be seen for this ongoing problem within the next 2 weeks. * PCP VISIT: Call your doctor (or NP/PA) during regular office hours and make an appointment. * First gently blow the nose to clear out any large clots. PINCH THE NOSTRILS TO STOP A NOSEBLEED: * LEAN FORWARD: Sit down and lean forward. Reason: Blood makes people choke if they lean backwards. [facility] THE NOSE: Gently squeeze the soft parts of the lower nose (nostrils) together. Use your thumb and your index finger in a pinching manner. Do this for 15 minutes. Use a clock or watch to measure the time. Goal: Apply constant pressure to the bleeding point. * If the bleeding continues after 15 minutes of squeezing, move your point of pressure and repeat again for another 15 minutes. * Dry air in your house or workplace can increase the chance of nosebleeds occurring. If the air is dry, use a humidifier in your bedroom to keep the nose from drying out. You can also apply petroleum jelly to the center wall (  septum) inside the nose twice daily to reduce cracking and to promote healing. PREVENTING FUTURE  NOSEBLEEDS: * Bleeding can start again if you rub your nose or blow the nose too hard. Avoid touching your nose and nose picking. Avoid blowing the nose. * Do not take aspirin or other anti-inflammatory medications (e.g., ibuprofen, Advil, Motrin, Aleve), unless you have been instructed to by your physician. CALL BACK IF: * Nosebleeding lasts longer than 30 minutes with using direct pressure * Lightheadedness or weakness occurs * Nosebleeds become worse * You become worse CARE ADVICE given per Nosebleed (Adult) guideline

## 2020-10-27 ENCOUNTER — Ambulatory Visit (INDEPENDENT_AMBULATORY_CARE_PROVIDER_SITE_OTHER): Payer: 59

## 2020-10-27 DIAGNOSIS — J309 Allergic rhinitis, unspecified: Secondary | ICD-10-CM | POA: Diagnosis not present

## 2020-10-28 ENCOUNTER — Encounter: Payer: Self-pay | Admitting: Family Medicine

## 2020-10-28 ENCOUNTER — Ambulatory Visit (INDEPENDENT_AMBULATORY_CARE_PROVIDER_SITE_OTHER): Payer: 59 | Admitting: Family Medicine

## 2020-10-28 ENCOUNTER — Other Ambulatory Visit: Payer: Self-pay

## 2020-10-28 VITALS — BP 117/72 | HR 88 | Temp 98.5°F | Ht 65.0 in | Wt 127.0 lb

## 2020-10-28 DIAGNOSIS — J3089 Other allergic rhinitis: Secondary | ICD-10-CM

## 2020-10-28 DIAGNOSIS — R040 Epistaxis: Secondary | ICD-10-CM | POA: Diagnosis not present

## 2020-10-28 NOTE — Progress Notes (Signed)
This visit occurred during the SARS-CoV-2 public health emergency.  Safety protocols were in place, including screening questions prior to the visit, additional usage of staff PPE, and extensive cleaning of exam room while observing appropriate contact time as indicated for disinfecting solutions.    Suzanne Jarvis , [birth year], 29 y.o., female MRN: [id number] Patient Care Team    Relationship Specialty Notifications Start End  Natalia Leatherwood, DO PCP - General Family Medicine  09/04/15   Rodell Perna, NP Nurse Practitioner Obstetrics and Gynecology  01/29/20     Chief Complaint  Patient presents with  . Epistaxis    Pt c/o progressively worsen nosebleeds when use nettie pot x 4 mos; Pt use nettie pot for allergies      Subjective: Pt presents for an OV with complaints of continue allergy/nasal symptoms and increase in bloody nose events. She has noticed the last 4 mos she has seen some blood tinged mucous in her nettie pot. However over the last few weeks she has had pure blood return at times and increase in blood everyday in the morning.  She uses flonase daily and other oral antihistamines and Singulair daily. She would like a referral to an ENT.  Depression screen [facility] 2/9 10/14/2020 01/29/2020 11/20/2019  Decreased Interest 0 0 0  Down, Depressed, Hopeless 0 0 0  PHQ - 2 Score 0 0 0    Allergies  Allergen Reactions  . Amoxicillin Hives  . Hyoscyamine Other (See Comments)  . Mixed Grasses   . Other      grass, weed, ragweed, trees, cat, mold, dog, dust mites. Borderline to cockroach.    . Paxil [Paroxetine Hcl]     Dizziness   . Penicillins     REACTION: hives   Social History   Social History Narrative  . Not on file   Past Medical History:  Diagnosis Date  . ADD (attention deficit disorder) 07/08/2020  . Angio-edema   . Asthma   . Chronic headaches   . Concussion with no loss of consciousness 07/08/2020   May 2021  . Eczema   . IBS (irritable bowel  syndrome)   . Tonsillitis   . Urticaria    Past Surgical History:  Procedure Laterality Date  . WISDOM TOOTH EXTRACTION     Family History  Problem Relation Age of Onset  . Allergies Mother   . Asthma Mother   . Hypertension Mother   . Allergic rhinitis Mother   . Urticaria Mother   . Arthritis Maternal Grandmother   . Irritable bowel syndrome Maternal Grandmother   . Breast cancer Maternal Grandmother   . Allergic rhinitis Father   . Allergic rhinitis Sister   . Eczema Sister   . Lung cancer Other        paternal great grandfather  . Lung cancer Other        maternal great grandfather  . Cancer Other        Blood-great grandmother  . Colon cancer Neg Hx    Allergies as of 10/28/2020      Reactions   Amoxicillin Hives   Hyoscyamine Other (See Comments)   Mixed Grasses    Other     grass, weed, ragweed, trees, cat, mold, dog, dust mites. Borderline to cockroach.     Paxil [paroxetine Hcl]    Dizziness   Penicillins    REACTION: hives      Medication List       Accurate as  of October 28, 2020 12:05 PM. If you have any questions, ask your nurse or doctor.        STOP taking these medications   loratadine 10 MG tablet Commonly known as: CLARITIN Stopped by: Felix Pacini, DO     TAKE these medications   amphetamine-dextroamphetamine 20 MG tablet Commonly known as: Adderall Take 1 tablet (20 mg total) by mouth daily.   ascorbic acid 500 MG tablet Commonly known as: VITAMIN C Take by mouth.   Azelastine HCl 0.15 % Soln Place 1-2 sprays into the nose 2 (two) times daily as needed (runny nose).   B-12 1000 MCG Tabs Take 2 tablets by mouth daily.   Biotin 94854 MCG Tabs Take by mouth. 5,000 MCG daily   dicyclomine 10 MG capsule Commonly known as: BENTYL TAKE 1 CAPSULE BY MOUTH FOUR TIMES DAILY BEFORE MEALS AND AT BEDTIME   diphenoxylate-atropine 2.5-0.025 MG tablet Commonly known as: LOMOTIL TAKE 1 TABLET BY MOUTH FOUR TIMES DAILY AS NEEDED FOR  DIARRHEA OR LOOSE STOOLS   drospirenone-ethinyl estradiol 3-0.02 MG tablet Commonly known as: YAZ Take 1 tablet by mouth daily.   EPINEPHrine 0.3 mg/0.3 mL Soaj injection Commonly known as: Auvi-Q Inject 0.3 mLs (0.3 mg total) into the muscle as needed for anaphylaxis.   fluticasone 50 MCG/ACT nasal spray Commonly known as: FLONASE Place into both nostrils daily.   levocetirizine 5 MG tablet Commonly known as: Xyzal Take 1 tablet (5 mg total) by mouth every evening.   Magnesium 100 MG Tabs Take by mouth.   montelukast 10 MG tablet Commonly known as: SINGULAIR Take 1 tablet (10 mg total) by mouth at bedtime.   multivitamin with minerals tablet Take 1 tablet by mouth daily.   olopatadine 0.1 % ophthalmic solution Commonly known as: Patanol Place 1 drop into both eyes 2 (two) times daily.   traZODone 50 MG tablet Commonly known as: DESYREL Take 1 tablet (50 mg total) by mouth at bedtime. FOR SLEEP   Vitamin D (Cholecalciferol) 25 MCG (1000 UT) Caps Take by mouth.       All past medical history, surgical history, allergies, family history, immunizations andmedications were updated in the EMR today and reviewed under the history and medication portions of their EMR.     ROS: Negative, with the exception of above mentioned in HPI   Objective:  BP 117/72   Pulse 88   Temp 98.5 F (36.9 C) (Oral)   Ht 5\' 5"  (1.651 m)   Wt 127 lb (57.6 kg)   LMP 10/03/2020 (Approximate)   SpO2 100%   BMI 21.13 kg/m  Body mass index is 21.13 kg/m. Gen: Afebrile. No acute distress. Nontoxic in appearance, well developed, well nourished.  HENT: AT. [place]. Bilateral TM visualized without erythema or bulging. MMM, no oral lesions. Bilateral nares without swelling, small amount of dried blood septum right. No obvious ulcerations or lesions.mild drainage. No erythema. Throat without erythema or exudates. No cough or hoarseness. n Eyes:Pupils Equal Round Reactive to light, Extraocular  movements intact,  Conjunctiva without redness, discharge or icterus. Neck/lymp/endocrine: Supple,no lymphadenopathy Neuro:  Normal gait. PERLA. EOMi. Alert. Oriented x3   No exam data present No results found. No results found for this or any previous visit (from the past 24 hour(s)).  Assessment/Plan: Suzanne Jarvis is a 29 y.o. female present for OV for  allergic rhinitis/Epistaxis Small amount of dried blood present right nostril today. No obvious source. No red flags on exam.  DC flonase Continue allergy  regimen.  Start use of cool mist humidifier.  - Ambulatory referral to ENT    Reviewed expectations re: course of current medical issues.  Discussed self-management of symptoms.  Outlined signs and symptoms indicating need for more acute intervention.  Patient verbalized understanding and all questions were answered.  Patient received an After-Visit Summary.    Orders Placed This Encounter  Procedures  . Ambulatory referral to ENT   No orders of the defined types were placed in this encounter.   Referral Orders     Ambulatory referral to ENT   Note is dictated utilizing voice recognition software. Although note has been proof read prior to signing, occasional typographical errors still can be missed. If any questions arise, please do not hesitate to call for verification.   electronically signed by:  Felix Pacini, DO  [facility] [place]

## 2020-10-28 NOTE — Patient Instructions (Signed)
Cool mist humidifier.  Stop flonase for now.  Referred to ENT.    Nosebleed, Adult A nosebleed is when blood comes out of the nose. Nosebleeds are common. Usually, they are not a sign of a serious condition. Nosebleeds can happen if a small blood vessel in your nose starts to bleed or if the lining of your nose (mucous membrane) cracks. They are commonly caused by:  Allergies.  Colds.  Picking your nose.  Blowing your nose too hard.  An injury from sticking an object into your nose or getting hit in the nose.  Dry or cold air. Less common causes of nosebleeds include:  Toxic fumes.  Something abnormal in the nose or in the air-filled spaces in the bones of the face (sinuses).  Growths in the nose, such as polyps.  Medicines or conditions that cause blood to clot slowly.  Certain illnesses or procedures that irritate or dry out the nasal passages. Follow these instructions at home: When you have a nosebleed:   Sit down and tilt your head slightly forward.  Use a clean towel or tissue to pinch your nostrils under the bony part of your nose. After 10 minutes, let go of your nose and see if bleeding starts again. Do not release pressure before that time. If there is still bleeding, repeat the pinching and holding for 10 minutes until the bleeding stops.  Do not place tissues or gauze in the nose to stop bleeding.  Avoid lying down and avoid tilting your head backward. That may make blood collect in the throat and cause gagging or coughing.  Use a nasal spray decongestant to help with a nosebleed as told by your health care provider.  Do not use petroleum jelly or mineral oil in your nose. It can drip into your lungs. After a nosebleed:  Avoid blowing your nose or sniffing for a number of hours.  Avoid straining, lifting, or bending at the waist for several days. You may resume other normal activities as you are able.  Use saline spray or a humidifier as told by your  health care provider.  Aspirinand blood thinners make bleeding more likely. If you are prescribed these medicines and you suffer from nosebleeds: ? Ask your health care provider if you should stop taking the medicines or if you should adjust the dose. ? Do not stop taking medicines that your health care provider has recommended unless told by your health care provider.  If your nosebleed was caused by dry mucous membranes, use over-the-counter saline nasal spray or gel. This will keep the mucous membranes moist and allow them to heal. If you must use a lubricant: ? Choose one that is water-soluble. ? Use only as much as you need and use it only as often as needed. ? Do not lie down until several hours after you use it. Contact a health care provider if:  You have a fever.  You get nosebleeds often or more often than usual.  You bruise very easily.  You have a nosebleed from having something stuck in your nose.  You have bleeding in your mouth.  You vomit or cough up brown material.  You have a nosebleed after you start a new medicine. Get help right away if:  You have a nosebleed after a fall or a head injury.  Your nosebleed does not go away after 20 minutes.  You feel dizzy or weak.  You have unusual bleeding from other parts of your body.  You  have unusual bruising on other parts of your body.  You become sweaty.  You vomit blood. This information is not intended to replace advice given to you by your health care provider. Make sure you discuss any questions you have with your health care provider. Document Revised: 01/30/2018 Document Reviewed: 05/17/2016 Elsevier Patient Education  2020 [employer].

## 2020-11-02 ENCOUNTER — Ambulatory Visit (INDEPENDENT_AMBULATORY_CARE_PROVIDER_SITE_OTHER): Payer: 59

## 2020-11-02 DIAGNOSIS — J309 Allergic rhinitis, unspecified: Secondary | ICD-10-CM | POA: Diagnosis not present

## 2020-11-17 ENCOUNTER — Ambulatory Visit (INDEPENDENT_AMBULATORY_CARE_PROVIDER_SITE_OTHER): Payer: 59 | Admitting: *Deleted

## 2020-11-17 DIAGNOSIS — J309 Allergic rhinitis, unspecified: Secondary | ICD-10-CM

## 2020-11-19 ENCOUNTER — Ambulatory Visit (INDEPENDENT_AMBULATORY_CARE_PROVIDER_SITE_OTHER): Payer: 59 | Admitting: Otolaryngology

## 2020-11-19 ENCOUNTER — Encounter (INDEPENDENT_AMBULATORY_CARE_PROVIDER_SITE_OTHER): Payer: Self-pay | Admitting: Otolaryngology

## 2020-11-19 ENCOUNTER — Other Ambulatory Visit: Payer: Self-pay

## 2020-11-19 VITALS — Temp 97.9°F

## 2020-11-19 DIAGNOSIS — Z8709 Personal history of other diseases of the respiratory system: Secondary | ICD-10-CM | POA: Diagnosis not present

## 2020-11-19 DIAGNOSIS — J310 Chronic rhinitis: Secondary | ICD-10-CM

## 2020-11-19 NOTE — Progress Notes (Signed)
HPI: Suzanne Jarvis is a 30 y.o. female who presents is referred by her PCP for evaluation of chronic sinus complaints.  She has a long history of allergies and is on several allergy medications.  She sees an allergist and [place] to [place]. She used to take Flonase but this was stopped because of nosebleeds.  The allergist has her on another allergy nasal spray for drainage.  She apparently has had frequent sinus infections and states that she took 6 rounds of antibiotics last year for sinus infections.  She also complains of chronic popping in her ears and a runny nose. Her last nosebleed was about 3 to 4 weeks ago.  But she frequently has bloody mucus when she blows her nose. She has not had any sinus x-rays..  Past Medical History:  Diagnosis Date  . ADD (attention deficit disorder) 07/08/2020  . Angio-edema   . Asthma   . Chronic headaches   . Concussion with no loss of consciousness 07/08/2020   May 2021  . Eczema   . IBS (irritable bowel syndrome)   . Tonsillitis   . Urticaria    Past Surgical History:  Procedure Laterality Date  . WISDOM TOOTH EXTRACTION     Social History   Socioeconomic History  . Marital status: Single    Spouse name: Not on file  . Number of children: 0  . Years of education: Not on file  . Highest education level: Not on file  Occupational History  . Occupation: cosmetologist  Tobacco Use  . Smoking status: Former Smoker    Packs/day: 0.25    Years: 6.00    Pack years: 1.50    Types: Cigarettes  . Smokeless tobacco: Never Used  Vaping Use  . Vaping Use: Some days  Substance and Sexual Activity  . Alcohol use: Yes    Comment: rare  . Drug use: No  . Sexual activity: Not on file  Other Topics Concern  . Not on file  Social History Narrative  . Not on file   Social Determinants of Health   Financial Resource Strain: Not on file  Food Insecurity: Not on file  Transportation Needs: Not on file  Physical Activity: Not on file  Stress:  Not on file  Social Connections: Not on file   Family History  Problem Relation Age of Onset  . Allergies Mother   . Asthma Mother   . Hypertension Mother   . Allergic rhinitis Mother   . Urticaria Mother   . Arthritis Maternal Grandmother   . Irritable bowel syndrome Maternal Grandmother   . Breast cancer Maternal Grandmother   . Allergic rhinitis Father   . Allergic rhinitis Sister   . Eczema Sister   . Lung cancer Other        paternal great grandfather  . Lung cancer Other        maternal great grandfather  . Cancer Other        Blood-great grandmother  . Colon cancer Neg Hx    Allergies  Allergen Reactions  . Amoxicillin Hives  . Hyoscyamine Other (See Comments)  . Mixed Grasses   . Other      grass, weed, ragweed, trees, cat, mold, dog, dust mites. Borderline to cockroach.    . Paxil [Paroxetine Hcl]     Dizziness   . Penicillins     REACTION: hives   Prior to Admission medications   Medication Sig Start Date End Date Taking? Authorizing Provider  amphetamine-dextroamphetamine (ADDERALL)  20 MG tablet Take 1 tablet (20 mg total) by mouth daily. 10/14/20   [name], [name], DO  ascorbic acid (VITAMIN C) 500 MG tablet Take by mouth.    [provider]  Azelastine HCl 0.15 % SOLN Place 1-2 sprays into the nose 2 (two) times daily as needed (runny nose). 04/28/20   Ellamae Sia, DO  Biotin 67893 MCG TABS Take by mouth. 5,000 MCG daily    [provider]  Cyanocobalamin (B-12) 1000 MCG TABS Take 2 tablets by mouth daily.    [provider]  dicyclomine (BENTYL) 10 MG capsule TAKE 1 CAPSULE BY MOUTH FOUR TIMES DAILY BEFORE MEALS AND AT BEDTIME 06/29/20   [name], [name], DO  diphenoxylate-atropine (LOMOTIL) 2.5-0.025 MG tablet TAKE 1 TABLET BY MOUTH FOUR TIMES DAILY AS NEEDED FOR DIARRHEA OR LOOSE STOOLS 07/06/20   [name], [name], DO  drospirenone-ethinyl estradiol (YAZ,GIANVI,LORYNA) 3-0.02 MG tablet Take 1 tablet by mouth daily.    [provider]  EPINEPHrine (AUVI-Q) 0.3 mg/0.3 mL IJ SOAJ injection Inject 0.3 mLs (0.3 mg total) into the muscle as needed for anaphylaxis. 04/28/20   Ellamae Sia, DO  fluticasone (FLONASE) 50 MCG/ACT nasal spray Place into both nostrils daily.    [provider]  levocetirizine (XYZAL) 5 MG tablet Take 1 tablet (5 mg total) by mouth every evening. 11/20/19   [name], [name], DO  Magnesium 100 MG TABS Take by mouth.    [provider]  montelukast (SINGULAIR) 10 MG tablet Take 1 tablet (10 mg total) by mouth at bedtime. 01/29/20   [name], [name], DO  Multiple Vitamins-Minerals (MULTIVITAMIN WITH MINERALS) tablet Take 1 tablet by mouth daily.    [provider]  olopatadine (PATANOL) 0.1 % ophthalmic solution Place 1 drop into both eyes 2 (two) times daily. 04/07/20   [name], [name], DO  traZODone (DESYREL) 50 MG tablet Take 1 tablet (50 mg total) by mouth at bedtime. FOR SLEEP 10/14/20   [name], [name], DO  Vitamin D, Cholecalciferol, 25 MCG (1000 UT) CAPS Take by mouth.    [provider]     Positive ROS: Otherwise negative  All other systems have been reviewed and were otherwise negative with the exception of those mentioned in the HPI and as above.  Physical Exam: Constitutional: Alert, well-appearing, no acute distress Ears: External ears without lesions or tenderness.  TMs are clear bilaterally with good mobility on pneumatic otoscopy and on hearing screening with the 512 and 1024 tuning fork she hears about the same in both ears with good hearing in both ears.  AC > BC bilaterally. Nasal: External nose without lesions. Septum with mild deformity..  After decongesting the nose nasal endoscopy was performed in the office today and on nasal endoscopy both middle meatus regions were clear.  Minimal edema.  No mucopurulent discharge.  No polyps noted.  The nasopharynx was clear in both eustachian tube regions were clear.  She did have mild rhinitis with  "tight" nasal passages bilaterally on nasal endoscopy. Oral: Lips and gums without lesions. Tongue and palate mucosa without lesions. Posterior oropharynx clear. Neck: No palpable adenopathy or masses Respiratory: Breathing comfortably  Skin: No facial/neck lesions or rash noted.  Procedures  Assessment: Chronic rhinitis with history of sinusitis  Plan: I think she would benefit from use of nasal steroid spray and prescribed Nasacort which has less incidence of epistaxis 2 sprays each nostril at night. Also discussed use of saline irrigations if  she is having a lot of mucus discharge. She will continue with her allergy medications as prescribed by her allergist. Briefly discussed obtaining a CT scan of her sinuses to better evaluate the sinuses if she continues to have recurrent sinus infections.  At this point would recommend medical treatment however if symptoms persist consider further evaluation with CT scan and possible surgical intervention.  Patient will call us to schedule CT scan of the sinuses In the future if needed.   Radene Journey, MD   CC:

## 2020-11-23 ENCOUNTER — Ambulatory Visit (INDEPENDENT_AMBULATORY_CARE_PROVIDER_SITE_OTHER): Payer: 59

## 2020-11-23 DIAGNOSIS — J309 Allergic rhinitis, unspecified: Secondary | ICD-10-CM

## 2020-11-25 ENCOUNTER — Ambulatory Visit (INDEPENDENT_AMBULATORY_CARE_PROVIDER_SITE_OTHER): Payer: 59 | Admitting: Psychology

## 2020-11-25 DIAGNOSIS — F4312 Post-traumatic stress disorder, chronic: Secondary | ICD-10-CM

## 2020-12-01 ENCOUNTER — Other Ambulatory Visit: Payer: Self-pay | Admitting: Family Medicine

## 2020-12-02 ENCOUNTER — Ambulatory Visit (INDEPENDENT_AMBULATORY_CARE_PROVIDER_SITE_OTHER): Payer: 59

## 2020-12-02 DIAGNOSIS — J309 Allergic rhinitis, unspecified: Secondary | ICD-10-CM

## 2020-12-08 ENCOUNTER — Ambulatory Visit (INDEPENDENT_AMBULATORY_CARE_PROVIDER_SITE_OTHER): Payer: 59

## 2020-12-08 DIAGNOSIS — J309 Allergic rhinitis, unspecified: Secondary | ICD-10-CM

## 2020-12-15 ENCOUNTER — Ambulatory Visit (INDEPENDENT_AMBULATORY_CARE_PROVIDER_SITE_OTHER): Payer: 59

## 2020-12-15 DIAGNOSIS — J309 Allergic rhinitis, unspecified: Secondary | ICD-10-CM

## 2020-12-16 ENCOUNTER — Ambulatory Visit (INDEPENDENT_AMBULATORY_CARE_PROVIDER_SITE_OTHER): Payer: 59 | Admitting: Psychology

## 2020-12-16 DIAGNOSIS — F4312 Post-traumatic stress disorder, chronic: Secondary | ICD-10-CM

## 2020-12-22 ENCOUNTER — Ambulatory Visit (INDEPENDENT_AMBULATORY_CARE_PROVIDER_SITE_OTHER): Payer: 59 | Admitting: *Deleted

## 2020-12-22 DIAGNOSIS — J309 Allergic rhinitis, unspecified: Secondary | ICD-10-CM

## 2020-12-30 ENCOUNTER — Ambulatory Visit (INDEPENDENT_AMBULATORY_CARE_PROVIDER_SITE_OTHER): Payer: 59 | Admitting: Psychology

## 2020-12-30 DIAGNOSIS — R21 Rash and other nonspecific skin eruption: Secondary | ICD-10-CM | POA: Insufficient documentation

## 2020-12-30 DIAGNOSIS — Z0289 Encounter for other administrative examinations: Secondary | ICD-10-CM | POA: Insufficient documentation

## 2020-12-30 DIAGNOSIS — F4312 Post-traumatic stress disorder, chronic: Secondary | ICD-10-CM

## 2020-12-31 ENCOUNTER — Ambulatory Visit (INDEPENDENT_AMBULATORY_CARE_PROVIDER_SITE_OTHER): Payer: 59

## 2020-12-31 DIAGNOSIS — J309 Allergic rhinitis, unspecified: Secondary | ICD-10-CM

## 2021-01-05 ENCOUNTER — Ambulatory Visit (INDEPENDENT_AMBULATORY_CARE_PROVIDER_SITE_OTHER): Payer: 59 | Admitting: *Deleted

## 2021-01-05 DIAGNOSIS — J309 Allergic rhinitis, unspecified: Secondary | ICD-10-CM

## 2021-01-06 ENCOUNTER — Ambulatory Visit: Payer: 59 | Admitting: Psychology

## 2021-01-12 ENCOUNTER — Ambulatory Visit (INDEPENDENT_AMBULATORY_CARE_PROVIDER_SITE_OTHER): Payer: 59

## 2021-01-12 ENCOUNTER — Other Ambulatory Visit: Payer: Self-pay

## 2021-01-12 DIAGNOSIS — J309 Allergic rhinitis, unspecified: Secondary | ICD-10-CM

## 2021-01-13 ENCOUNTER — Ambulatory Visit (INDEPENDENT_AMBULATORY_CARE_PROVIDER_SITE_OTHER): Payer: 59 | Admitting: Psychology

## 2021-01-13 ENCOUNTER — Ambulatory Visit (INDEPENDENT_AMBULATORY_CARE_PROVIDER_SITE_OTHER): Payer: 59 | Admitting: Family Medicine

## 2021-01-13 ENCOUNTER — Encounter: Payer: Self-pay | Admitting: Family Medicine

## 2021-01-13 VITALS — BP 117/81 | HR 103 | Temp 99.0°F | Ht 65.0 in

## 2021-01-13 DIAGNOSIS — R209 Unspecified disturbances of skin sensation: Secondary | ICD-10-CM

## 2021-01-13 DIAGNOSIS — J302 Other seasonal allergic rhinitis: Secondary | ICD-10-CM

## 2021-01-13 DIAGNOSIS — I7300 Raynaud's syndrome without gangrene: Secondary | ICD-10-CM | POA: Diagnosis not present

## 2021-01-13 DIAGNOSIS — F4312 Post-traumatic stress disorder, chronic: Secondary | ICD-10-CM | POA: Diagnosis not present

## 2021-01-13 DIAGNOSIS — F988 Other specified behavioral and emotional disorders with onset usually occurring in childhood and adolescence: Secondary | ICD-10-CM

## 2021-01-13 DIAGNOSIS — J3089 Other allergic rhinitis: Secondary | ICD-10-CM

## 2021-01-13 DIAGNOSIS — G479 Sleep disorder, unspecified: Secondary | ICD-10-CM | POA: Diagnosis not present

## 2021-01-13 DIAGNOSIS — M7989 Other specified soft tissue disorders: Secondary | ICD-10-CM | POA: Diagnosis not present

## 2021-01-13 DIAGNOSIS — S060X0D Concussion without loss of consciousness, subsequent encounter: Secondary | ICD-10-CM | POA: Diagnosis not present

## 2021-01-13 DIAGNOSIS — K580 Irritable bowel syndrome with diarrhea: Secondary | ICD-10-CM | POA: Diagnosis not present

## 2021-01-13 LAB — COMPREHENSIVE METABOLIC PANEL
ALT: 13 U/L (ref 0–35)
AST: 17 U/L (ref 0–37)
Albumin: 4.2 g/dL (ref 3.5–5.2)
Alkaline Phosphatase: 39 U/L (ref 39–117)
BUN: 9 mg/dL (ref 6–23)
CO2: 26 mEq/L (ref 19–32)
Calcium: 9.5 mg/dL (ref 8.4–10.5)
Chloride: 102 mEq/L (ref 96–112)
Creatinine, Ser: 0.76 mg/dL (ref 0.40–1.20)
GFR: 105.57 mL/min (ref >60.00)
Glucose, Bld: 85 mg/dL (ref 70–99)
Potassium: 3.7 mEq/L (ref 3.5–5.1)
Sodium: 136 mEq/L (ref 135–145)
Total Bilirubin: 0.5 mg/dL (ref 0.2–1.2)
Total Protein: 7.1 g/dL (ref 6.0–8.3)

## 2021-01-13 LAB — CBC WITH DIFFERENTIAL/PLATELET
Basophils Absolute: 0 10*3/uL (ref 0.0–0.1)
Basophils Relative: 1 % (ref 0.0–3.0)
Eosinophils Absolute: 0.3 10*3/uL (ref 0.0–0.7)
Eosinophils Relative: 5.4 % — ABNORMAL HIGH (ref 0.0–5.0)
HCT: 40.9 % (ref 36.0–46.0)
Hemoglobin: 14.3 g/dL (ref 12.0–15.0)
Lymphocytes Relative: 26.6 % (ref 12.0–46.0)
Lymphs Abs: 1.3 10*3/uL (ref 0.7–4.0)
MCHC: 35 g/dL (ref 30.0–36.0)
MCV: 92 fl (ref 78.0–100.0)
Monocytes Absolute: 0.4 10*3/uL (ref 0.1–1.0)
Monocytes Relative: 7.4 % (ref 3.0–12.0)
Neutro Abs: 2.8 10*3/uL (ref 1.4–7.7)
Neutrophils Relative %: 59.6 % (ref 43.0–77.0)
Platelets: 184 10*3/uL (ref 150.0–400.0)
RBC: 4.45 Mil/uL (ref 3.87–5.11)
RDW: 12.3 % (ref 11.5–15.5)
WBC: 4.8 10*3/uL (ref 4.0–10.5)

## 2021-01-13 MED ORDER — DIPHENOXYLATE-ATROPINE 2.5-0.025 MG PO TABS
ORAL_TABLET | ORAL | 1 refills | Status: DC
Start: 2021-01-13 — End: 2021-01-13

## 2021-01-13 MED ORDER — DIPHENOXYLATE-ATROPINE 2.5-0.025 MG PO TABS: ORAL_TABLET | ORAL | 1 refills | Status: DC

## 2021-01-13 MED ORDER — AMPHETAMINE-DEXTROAMPHETAMINE 10 MG PO TABS: 10.0000 mg | ORAL_TABLET | Freq: Two times a day (BID) | ORAL | 0 refills | Status: DC

## 2021-01-13 MED ORDER — MONTELUKAST SODIUM 10 MG PO TABS: 10.0000 mg | ORAL_TABLET | Freq: Every day | ORAL | 3 refills | Status: DC

## 2021-01-13 MED ORDER — DICYCLOMINE HCL 10 MG PO CAPS: ORAL_CAPSULE | ORAL | 3 refills | Status: DC

## 2021-01-13 MED ORDER — LEVOCETIRIZINE DIHYDROCHLORIDE 5 MG PO TABS: ORAL_TABLET | ORAL | 1 refills | Status: DC

## 2021-01-13 NOTE — Progress Notes (Signed)
This visit occurred during the SARS-CoV-2 public health emergency.  Safety protocols were in place, including screening questions prior to the visit, additional usage of staff PPE, and extensive cleaning of exam room while observing appropriate contact time as indicated for disinfecting solutions.    Suzanne Jarvis , [birth year], 30 y.o., female MRN: [id number] Patient Care Team    Relationship Specialty Notifications Start End  [name], DO PCP - General Family Medicine  09/04/15   [name], NP (Inactive) Nurse Practitioner Obstetrics and Gynecology  01/29/20     Chief Complaint  Patient presents with  . Follow-up    [facility];   . Foot Swelling    Pt c/o b/l foot pain, swelling and redness x 3 mos     Subjective:Suzanne Jarvis is a 30 y.o.  pt present for an OV for chronic medical conditions and acute concern Attention deficit:  follow-up with her attention deficit status post concussion.  Patient reports she is no longer taking the trazodone to help her with sleep.  She has decreased the Adderall to 10 mg twice daily and feels it is working well. Prior note: Status post concussion and sequela of insomnia and focus deficits.  She has been evaluated and followed by sports medicine for this condition.  Her condition is felt to be stable and is now referred back to primary care to take over management.  Patient reports she is sleeping well on trazodone 50 mg nightly.  She also feels her focus is much improved on Adderall 20 mg XR.  She is uncertain if she needs this high of a dose.  She also would like to hopefully not have to stay on trazodone or Adderall long-term.  She does admit they are both helpful with her condition.    Irritable bowel syndrome with diarrhea Patient reports her irritable bowel syndrome is well controlled.  She is using the Lomotil and Bentyl as needed.  Seasonal allergies She reports compliance with angular 10 mg nightly and Xyzal 5 mg daily.  She  also uses her Flonase twice daily.   Extremity swelling and pain: Patient reports she has been experiencing bilateral feet and hand swelling and redness for approximately 3 months.  She endorses fingers, toes and hands blanching and being very pale with blue nailbeds at times.  Other times her fingers or toes will become extremely red and they will skin or burn.  States sometimes feels like needles.  Debbe Bales is very mild in her feet when this occurs.  Events can last a few minutes to almost an hour at a time.  It can occur multiple days in a row.  Direct cold exposure is not necessarily a trigger, but events started to occur during winter.  Depression screen [facility] 2/9 10/14/2020 01/29/2020 11/20/2019  Decreased Interest 0 0 0  Down, Depressed, Hopeless 0 0 0  PHQ - 2 Score 0 0 0    Allergies  Allergen Reactions  . Amoxicillin Hives  . Hyoscyamine Other (See Comments)  . Mixed Grasses   . Other      grass, weed, ragweed, trees, cat, mold, dog, dust mites. Borderline to cockroach.    . Paxil [Paroxetine Hcl]     Dizziness   . Penicillins     REACTION: hives   Social History   Social History Narrative  . Not on file   Past Medical History:  Diagnosis Date  . ADD (attention deficit disorder) 07/08/2020  . Angio-edema   .  Asthma   . Chronic headaches   . Concussion with no loss of consciousness 07/08/2020   May 2021  . Eczema   . IBS (irritable bowel syndrome)   . Tonsillitis   . Urticaria    Past Surgical History:  Procedure Laterality Date  . WISDOM TOOTH EXTRACTION     Family History  Problem Relation Age of Onset  . Allergies Mother   . Asthma Mother   . Hypertension Mother   . Allergic rhinitis Mother   . Urticaria Mother   . Arthritis Maternal Grandmother   . Irritable bowel syndrome Maternal Grandmother   . Breast cancer Maternal Grandmother   . Allergic rhinitis Father   . Allergic rhinitis Sister   . Eczema Sister   . Lung cancer Other        paternal great  grandfather  . Lung cancer Other        maternal great grandfather  . Cancer Other        Blood-great grandmother  . Colon cancer Neg Hx    Allergies as of 01/13/2021      Reactions   Amoxicillin Hives   Hyoscyamine Other (See Comments)   Mixed Grasses    Other     grass, weed, ragweed, trees, cat, mold, dog, dust mites. Borderline to cockroach.     Paxil [paroxetine Hcl]    Dizziness   Penicillins    REACTION: hives      Medication List       Accurate as of January 13, 2021 11:59 PM. If you have any questions, ask your nurse or doctor.        STOP taking these medications   amphetamine-dextroamphetamine 20 MG tablet Commonly known as: Adderall Replaced by: amphetamine-dextroamphetamine 10 MG tablet Stopped by: Howard Pouch, DO   traZODone 50 MG tablet Commonly known as: DESYREL Stopped by: Howard Pouch, DO     TAKE these medications   amphetamine-dextroamphetamine 10 MG tablet Commonly known as: Adderall Take 1 tablet (10 mg total) by mouth 2 (two) times daily. Replaces: amphetamine-dextroamphetamine 20 MG tablet Started by: Howard Pouch, DO   ascorbic acid 500 MG tablet Commonly known as: VITAMIN C Take by mouth.   Azelastine HCl 0.15 % Soln Place 1-2 sprays into the nose 2 (two) times daily as needed (runny nose).   B-12 1000 MCG Tabs Take 2 tablets by mouth daily.   Biotin 10000 MCG Tabs Take by mouth. 5,000 MCG daily   dicyclomine 10 MG capsule Commonly known as: BENTYL TAKE 1 CAPSULE BY MOUTH FOUR TIMES DAILY BEFORE MEALS AND AT BEDTIME PRN What changed: additional instructions Changed by: Howard Pouch, DO   diphenoxylate-atropine 2.5-0.025 MG tablet Commonly known as: LOMOTIL TAKE 1 TABLET BY MOUTH FOUR TIMES DAILY AS NEEDED FOR DIARRHEA OR LOOSE STOOLS   drospirenone-ethinyl estradiol 3-0.02 MG tablet Commonly known as: YAZ Take 1 tablet by mouth daily.   EPINEPHrine 0.3 mg/0.3 mL Soaj injection Commonly known as: Auvi-Q Inject 0.3 mLs (0.3  mg total) into the muscle as needed for anaphylaxis.   fluticasone 50 MCG/ACT nasal spray Commonly known as: FLONASE Place into both nostrils daily.   levocetirizine 5 MG tablet Commonly known as: XYZAL TAKE 1 TABLET(5 MG) BY MOUTH EVERY EVENING   Magnesium 100 MG Tabs Take by mouth.   montelukast 10 MG tablet Commonly known as: SINGULAIR Take 1 tablet (10 mg total) by mouth at bedtime.   multivitamin with minerals tablet Take 1 tablet by mouth daily.  nystatin-triamcinolone cream Commonly known as: MYCOLOG II SMARTSIG:Sparingly Topical 1 to 2 Times Daily PRN   olopatadine 0.1 % ophthalmic solution Commonly known as: Patanol Place 1 drop into both eyes 2 (two) times daily.   Vitamin D (Cholecalciferol) 25 MCG (1000 UT) Caps Take by mouth.       All past medical history, surgical history, allergies, family history, immunizations andmedications were updated in the EMR today and reviewed under the history and medication portions of their EMR.     ROS: Negative, with the exception of above mentioned in HPI   Objective:  BP 117/81   Pulse (!) 103   Temp 99 F (37.2 C) (Oral)   Ht $R'5\' 5"'iq$  (1.651 m)   SpO2 100%   BMI 21.13 kg/m  Body mass index is 21.13 kg/m. Gen: Afebrile. No acute distress.  Nontoxic.  Very pleasant female HENT: AT. [place].  Eyes:Pupils Equal Round Reactive to light, Extraocular movements intact,  Conjunctiva without redness, discharge or icterus. Neck/lymp/endocrine: Supple, no lymphadenopathy, no thyromegaly CV: RRR no murmur, no edema, +2/4 P posterior tibialis pulses Chest: CTAB, no wheeze or crackles Skin: no rashes, purpura or petechiae.  Bilateral hands and feet warm and well-perfused. Neuro: Normal gait. PERLA. EOMi. Alert. Oriented x3 Psych: Normal affect, dress and demeanor. Normal speech. Normal thought content and judgment.   No exam data present No results found. No results found for this or any previous visit (from the past 24  hour(s)).  Assessment/Plan: Suzanne Jarvis is a 30 y.o. female present for OV for  Attention deficit disorder (ADD) without hyperactivity/Controlled substance agreement signed/history of concussion with focus deficit Stable DC'd trazodone no longer needing Decreased Adderall to 10 mg twice daily, patient was able to taper down well.  If desired she will continue to try to taper down. [place] controlled substance database reviewed 01/13/2021 Controlled substance agreement signed today UDS completed Follow-up 3 months  Attention deficit:  follow-up with her attention deficit status post concussion.  Patient reports she is no longer taking the trazodone to help her with sleep.  She has decreased the Adderall to 10 mg twice daily and feels it is working well. Prior note: Status post concussion and sequela of insomnia and focus deficits.  She has been evaluated and followed by sports medicine for this condition.  Her condition is felt to be stable and is now referred back to primary care to take over management.  Patient reports she is sleeping well on trazodone 50 mg nightly.  She also feels her focus is much improved on Adderall 20 mg XR.  She is uncertain if she needs this high of a dose.  She also would like to hopefully not have to stay on trazodone or Adderall long-term.  She does admit they are both helpful with her condition.   Irritable bowel syndrome with diarrhea Patient reports her irritable bowel syndrome is well controlled.  She is using the Lomotil and Bentyl as needed.  Seasonal allergies Stable. Continue Singulair 10 mg nightly Continue Xyzal 5 mg Continue Flonase  Extremity swelling and pain/cold extremities: Was able to view pictures she had taken during the time of her events and appears to be consistent with Raynaud's phenomena.  We will start evaluation today to rule out possible causes Iron panel, ANA, urinalysis, CBC, CMP and TSH collected today. Could consider  amlodipine 2.5 mg daily, at least during winter months.   Reviewed expectations re: course of current medical issues.  Discussed self-management of symptoms.  Outlined signs and symptoms indicating need for more acute intervention.  Patient verbalized understanding and all questions were answered.  Patient received an After-Visit Summary.    Orders Placed This Encounter  Procedures  . Iron, TIBC and Ferritin Panel  . ANA, IFA Comprehensive Panel-([facility])  . Urinalysis w microscopic + reflex cultur  . CBC w/Diff  . Comp Met (CMET)  . TSH  . REFLEXIVE URINE CULTURE   Meds ordered this encounter  Medications  . levocetirizine (XYZAL) 5 MG tablet    Sig: TAKE 1 TABLET(5 MG) BY MOUTH EVERY EVENING    Dispense:  90 tablet    Refill:  1  . montelukast (SINGULAIR) 10 MG tablet    Sig: Take 1 tablet (10 mg total) by mouth at bedtime.    Dispense:  90 tablet    Refill:  3  . dicyclomine (BENTYL) 10 MG capsule    Sig: TAKE 1 CAPSULE BY MOUTH FOUR TIMES DAILY BEFORE MEALS AND AT BEDTIME PRN    Dispense:  120 capsule    Refill:  3    Prn med- please only fill if pt request  . DISCONTD: diphenoxylate-atropine (LOMOTIL) 2.5-0.025 MG tablet    Sig: TAKE 1 TABLET BY MOUTH FOUR TIMES DAILY AS NEEDED FOR DIARRHEA OR LOOSE STOOLS    Dispense:  180 tablet    Refill:  1  . amphetamine-dextroamphetamine (ADDERALL) 10 MG tablet    Sig: Take 1 tablet (10 mg total) by mouth 2 (two) times daily.    Dispense:  180 tablet    Refill:  0  . diphenoxylate-atropine (LOMOTIL) 2.5-0.025 MG tablet    Sig: TAKE 1 TABLET BY MOUTH FOUR TIMES DAILY AS NEEDED FOR DIARRHEA OR LOOSE STOOLS    Dispense:  180 tablet    Refill:  1   Referral Orders  No referral(s) requested today     Note is dictated utilizing voice recognition software. Although note has been proof read prior to signing, occasional typographical errors still can be missed. If any questions arise, please do not hesitate to call for  verification.   electronically signed by:  Howard Pouch, DO  [place]

## 2021-01-13 NOTE — Patient Instructions (Signed)
We will collect labs today and call you with results.  I have refilled all your other meds.   Next appt 3 mos, sooner if needed.    Raynaud Phenomenon  Raynaud phenomenon is a condition that affects the blood vessels (arteries) that carry blood to your fingers and toes. The arteries that supply blood to your ears, lips, nipples, or the tip of your nose might also be affected. Raynaud phenomenon causes the arteries to become narrow temporarily (spasm). As a result, the flow of blood to the affected areas is temporarily decreased. This usually occurs in response to cold temperatures or stress. During an attack, the skin in the affected areas turns white, then blue, and finally red. You may also feel tingling or numbness in those areas. Attacks usually last for only a brief period, and then the blood flow to the area returns to normal. In most cases, Raynaud phenomenon does not cause serious health problems. What are the causes? In many cases, the cause of this condition is not known. The condition may occur on its own (primary Raynaud phenomenon) or may be associated with other diseases or factors (secondary Raynaud phenomenon). Possible causes may include:  Diseases or medical conditions that damage the arteries.  Injuries and repetitive actions that hurt the hands or feet.  Being exposed to certain chemicals.  Taking medicines that narrow the arteries.  Other medical conditions, such as lupus, scleroderma, rheumatoid arthritis, thyroid problems, blood disorders, Sjogren syndrome, or atherosclerosis. What increases the risk? The following factors may make you more likely to develop this condition:  Being 106-32 years old.  Being female.  Having a family history of Raynaud phenomenon.  Living in a cold climate.  Smoking. What are the signs or symptoms? Symptoms of this condition usually occur when you are exposed to cold temperatures or when you have emotional stress. The symptoms  may last for a few minutes or up to several hours. They usually affect your fingers but may also affect your toes, nipples, lips, ears, or the tip of your nose. Symptoms may include:  Changes in skin color. The skin in the affected areas will turn pale or white. The skin may then change from white to bluish to red as normal blood flow returns to the area.  Numbness, tingling, or pain in the affected areas. In severe cases, symptoms may include:  Skin sores.  Tissues decaying and dying (gangrene). How is this diagnosed? This condition may be diagnosed based on:  Your symptoms and medical history.  A physical exam. During the exam, you may be asked to put your hands in cold water to check for a reaction to cold temperature.  Tests, such as: ? Blood tests to check for other diseases or conditions. ? A test to check the movement of blood through your arteries and veins (vascular ultrasound). ? A test in which the skin at the base of your fingernail is examined under a microscope (nailfold capillaroscopy). How is this treated? Treatment for this condition often involves making lifestyle changes and taking steps to control your exposure to cold temperatures. For more severe cases, medicine (calcium channel blockers) may be used to improve blood flow. Surgery is sometimes done to block the nerves that control the affected arteries, but this is rare. Follow these instructions at home: Avoiding cold temperatures Take these steps to avoid exposure to cold:  If possible, stay indoors during cold weather.  When you go outside during cold weather, dress in layers and wear  mittens, a hat, a scarf, and warm footwear.  Wear mittens or gloves when handling ice or frozen food.  Use holders for glasses or cans containing cold drinks.  Let warm water run for a while before taking a shower or bath.  Warm up the car before driving in cold weather. Lifestyle  If possible, avoid stressful and  emotional situations. Try to find ways to manage your stress, such as: ? Exercise. ? Yoga. ? Meditation. ? Biofeedback.  Do not use any products that contain nicotine or tobacco, such as cigarettes and e-cigarettes. If you need help quitting, ask your health care provider.  Avoid secondhand smoke.  Limit your use of caffeine. ? Switch to decaffeinated coffee, tea, and soda. ? Avoid chocolate.  Avoid vibrating tools and machinery. General instructions  Protect your hands and feet from injuries, cuts, or bruises.  Avoid wearing tight rings or wristbands.  Wear loose fitting socks and comfortable, roomy shoes.  Take over-the-counter and prescription medicines only as told by your health care provider. Contact a health care provider if:  Your discomfort becomes worse despite lifestyle changes.  You develop sores on your fingers or toes that do not heal.  Your fingers or toes turn black.  You have breaks in the skin on your fingers or toes.  You have a fever.  You have pain or swelling in your joints.  You have a rash.  Your symptoms occur on only one side of your body. Summary  Raynaud phenomenon is a condition that affects the arteries that carry blood to your fingers, toes, ears, lips, nipples, or the tip of your nose.  In many cases, the cause of this condition is not known.  Symptoms of this condition include changes in skin color, and numbness and tingling of the affected area.  Treatment for this condition includes lifestyle changes, reducing exposure to cold temperatures, and using medicines for severe cases of the condition.  Contact your health care provider if your condition worsens despite treatment. This information is not intended to replace advice given to you by your health care provider. Make sure you discuss any questions you have with your health care provider. Document Revised: 03/12/2020 Document Reviewed: 03/12/2020 Elsevier Patient Education   2021 [employer].

## 2021-01-14 ENCOUNTER — Encounter: Payer: Self-pay | Admitting: Family Medicine

## 2021-01-14 DIAGNOSIS — J302 Other seasonal allergic rhinitis: Secondary | ICD-10-CM | POA: Insufficient documentation

## 2021-01-14 HISTORY — DX: Other seasonal allergic rhinitis: J30.2

## 2021-01-14 LAB — TSH: TSH: 3.89 u[IU]/mL (ref 0.35–4.50)

## 2021-01-15 ENCOUNTER — Telehealth: Payer: Self-pay | Admitting: Family Medicine

## 2021-01-15 DIAGNOSIS — R768 Other specified abnormal immunological findings in serum: Secondary | ICD-10-CM

## 2021-01-15 DIAGNOSIS — I7300 Raynaud's syndrome without gangrene: Secondary | ICD-10-CM

## 2021-01-15 LAB — URINALYSIS W MICROSCOPIC + REFLEX CULTURE
Bacteria, UA: NONE SEEN /HPF
Bilirubin Urine: NEGATIVE
Glucose, UA: NEGATIVE
Hgb urine dipstick: NEGATIVE
Hyaline Cast: NONE SEEN /LPF
Ketones, ur: NEGATIVE
Leukocyte Esterase: NEGATIVE
Nitrites, Initial: NEGATIVE
Protein, ur: NEGATIVE
RBC / HPF: NONE SEEN /HPF (ref 0–2)
Specific Gravity, Urine: 1.004 (ref 1.001–1.03)
Squamous Epithelial / HPF: NONE SEEN /HPF (ref <=5)
WBC, UA: NONE SEEN /HPF (ref 0–5)
pH: 6 (ref 5.0–8.0)

## 2021-01-15 LAB — ANA, IFA COMPREHENSIVE PANEL
Anti Nuclear Antibody (ANA): POSITIVE — AB
ENA SM Ab Ser-aCnc: <1 AI
SM/RNP: <1 AI
SSA (Ro) (ENA) Antibody, IgG: <1 AI
SSB (La) (ENA) Antibody, IgG: <1 AI
Scleroderma (Scl-70) (ENA) Antibody, IgG: <1 AI
ds DNA Ab: 2 IU/mL

## 2021-01-15 LAB — ANTI-NUCLEAR AB-TITER (ANA TITER): ANA Titer 1: 1:80 {titer} — ABNORMAL HIGH

## 2021-01-15 LAB — IRON,TIBC AND FERRITIN PANEL
%SAT: 28 % (calc) (ref 16–45)
Ferritin: 42 ng/mL (ref 16–154)
Iron: 105 ug/dL (ref 40–190)
TIBC: 371 mcg/dL (calc) (ref 250–450)

## 2021-01-15 LAB — NO CULTURE INDICATED

## 2021-01-15 MED ORDER — AMLODIPINE BESYLATE 2.5 MG PO TABS
2.5000 mg | ORAL_TABLET | Freq: Every day | ORAL | 1 refills | Status: DC
Start: 2021-01-15 — End: 2021-04-14

## 2021-01-15 NOTE — Telephone Encounter (Signed)
Patient advised and voiced understanding.  

## 2021-01-15 NOTE — Telephone Encounter (Signed)
Please inform patient  The last test has returned and is not positive for particular autoimmune antibodies. The ANA is a very low titre. Typically levels like 1:320 or higher is indicative of autoimmune disease.  If she desires we can try and place a referral to rheumatologist for their opinion. Please place referral if she is desiring for raynauds and positive ana please.   I have called in a medicine called amlodipine 2.5 mg QD. This is a med that is used to treat BP issues, but at this low dose is used to treat raynauds. Starting this med daily will help decrease the occurrences of her symptoms. Also keeping feet and  hands warm can help.

## 2021-01-15 NOTE — Addendum Note (Signed)
Addended by: Paschal Dopp on: 01/15/2021 04:39 PM   Modules accepted: Orders

## 2021-01-19 ENCOUNTER — Ambulatory Visit (INDEPENDENT_AMBULATORY_CARE_PROVIDER_SITE_OTHER): Payer: 59

## 2021-01-19 DIAGNOSIS — J309 Allergic rhinitis, unspecified: Secondary | ICD-10-CM | POA: Diagnosis not present

## 2021-01-20 ENCOUNTER — Ambulatory Visit (INDEPENDENT_AMBULATORY_CARE_PROVIDER_SITE_OTHER): Payer: 59 | Admitting: Psychology

## 2021-01-20 DIAGNOSIS — F4312 Post-traumatic stress disorder, chronic: Secondary | ICD-10-CM

## 2021-01-27 ENCOUNTER — Ambulatory Visit (INDEPENDENT_AMBULATORY_CARE_PROVIDER_SITE_OTHER): Payer: 59 | Admitting: Allergy

## 2021-01-27 ENCOUNTER — Ambulatory Visit (INDEPENDENT_AMBULATORY_CARE_PROVIDER_SITE_OTHER): Payer: 59 | Admitting: Psychology

## 2021-01-27 ENCOUNTER — Other Ambulatory Visit: Payer: Self-pay

## 2021-01-27 ENCOUNTER — Encounter: Payer: Self-pay | Admitting: Allergy

## 2021-01-27 VITALS — BP 124/80 | HR 80 | Temp 98.1°F | Resp 18 | Ht 65.0 in | Wt 126.4 lb

## 2021-01-27 DIAGNOSIS — J309 Allergic rhinitis, unspecified: Secondary | ICD-10-CM

## 2021-01-27 DIAGNOSIS — Z880 Allergy status to penicillin: Secondary | ICD-10-CM | POA: Diagnosis not present

## 2021-01-27 DIAGNOSIS — F4312 Post-traumatic stress disorder, chronic: Secondary | ICD-10-CM | POA: Diagnosis not present

## 2021-01-27 DIAGNOSIS — H1013 Acute atopic conjunctivitis, bilateral: Secondary | ICD-10-CM

## 2021-01-27 DIAGNOSIS — J3089 Other allergic rhinitis: Secondary | ICD-10-CM

## 2021-01-27 DIAGNOSIS — J302 Other seasonal allergic rhinitis: Secondary | ICD-10-CM

## 2021-01-27 DIAGNOSIS — Z8709 Personal history of other diseases of the respiratory system: Secondary | ICD-10-CM | POA: Insufficient documentation

## 2021-01-27 DIAGNOSIS — H1010 Acute atopic conjunctivitis, unspecified eye: Secondary | ICD-10-CM

## 2021-01-27 MED ORDER — OLOPATADINE HCL 0.1 % OP SOLN: 1.0000 [drp] | Freq: Two times a day (BID) | OPHTHALMIC | 5 refills | Status: DC | PRN

## 2021-01-27 MED ORDER — LEVOCETIRIZINE DIHYDROCHLORIDE 5 MG PO TABS: ORAL_TABLET | ORAL | 3 refills | Status: DC

## 2021-01-27 MED ORDER — AZELASTINE HCL 0.15 % NA SOLN: 1.0000 | Freq: Two times a day (BID) | NASAL | 5 refills | Status: DC | PRN

## 2021-01-27 MED ORDER — MONTELUKAST SODIUM 10 MG PO TABS: 10.0000 mg | ORAL_TABLET | Freq: Every day | ORAL | 3 refills | Status: DC

## 2021-01-27 NOTE — Assessment & Plan Note (Signed)
Past history - Rhino conjunctivitis symptoms from the spring through fall for the past 5 years. 2 sinus infections per year. 2021 skin testing showed: Positive to grass, weed, ragweed, trees, cat, mold, dog, dust mites. Borderline to cockroach.  Negative to common foods. Started AIT on 05/20/2020 (G-[id number] and M-[id number]-DM) Interim history -  Had a reaction after the last injection with rashes on the face and larger than normal localized reactions. Patient self treated with additional antihistamines with good benefit. Saw ENT for epistaxis with normal work up.   Continue allergy injections - given today.  If you have a reactions next time let us know.  Patient was given the wrong dose of allergy injections at the last injection visit which is what caused the reaction above.   Patient has up to date Auvi-Q on hand.   Continue environmental control measures as below.  May use over the counter antihistamines such as Zyrtec (cetirizine), Claritin (loratadine), Allegra (fexofenadine), or Xyzal (levocetirizine) daily as needed. May take twice a day if needed. Continue with Singulair (montelukast) 10mg  daily at night.  May use olopatadine eye drops 0.1% twice a day as needed for itchy/watery eyes.  May use Flonase 1 spray per nostril twice a day for nasal congestion.  May use azelastine 1-2 sprays per nostril twice a day as needed for runny nose.  Nasal saline spray (i.e., Simply Saline) or nasal saline lavage (i.e., NeilMed) is recommended as needed and prior to medicated nasal sprays.  If still having issues with epistaxis - will try to switch to a nasal spray that is less irritating such as Qnasl.

## 2021-01-27 NOTE — Assessment & Plan Note (Signed)
Notes frequent sinus infections needing frequent antibiotics.  Keep track of infections. Get bloodwork to look at immune system.  Discussed with patient that environmental allergies can lead to more frequent sinus infections due to constant state of inflammation in the sinuses.

## 2021-01-27 NOTE — Patient Instructions (Addendum)
Environmental allergies  2021 skin testing was Positive to grass, weed, ragweed, trees, cat, mold, dog, dust mites. Borderline to cockroach.   Continue allergy injections - given today.  If you have a reactions next time let us know   Continue nvironmental control measures as below.  May use over the counter antihistamines such as Zyrtec (cetirizine), Claritin (loratadine), Allegra (fexofenadine), or Xyzal (levocetirizine) daily as needed. May take twice a day if needed. Continue with Singulair (montelukast) 10mg  daily at night.  May use olopatadine eye drops 0.1% twice a day as needed for itchy/watery eyes.  May use Flonase 1 spray per nostril twice a day for nasal congestion.  May use azelastine 1-2 sprays per nostril twice a day as needed for runny nose.  Nasal saline spray (i.e., Simply Saline) or nasal saline lavage (i.e., NeilMed) is recommended as needed and prior to medicated nasal sprays.  Sinus infections  Keep track of infections. Get bloodwork:  We are ordering labs, so please allow 1-2 weeks for the results to come back. With the newly implemented Cures Act, the labs might be visible to you at the same time that they become visible to me. However, I will not address the results until all of the results are back, so please be patient.  In the meantime, continue recommendations in your patient instructions, including avoidance measures (if applicable), until you hear from me.  Penicillin allergy  Consider penicillin skin testing/drug challenge in the future.  If interested we can schedule drug challenge to penicillin. You must be off antihistamines for 3-5 days before. Must be in good health and not ill. Plan on being in the office for 2-3 hours and must bring in the drug you want to do the oral challenge for - will send in prescription to pick up a few days before. You must call to schedule an appointment and specify it's for a drug challenge.   More than 90% of  patients outgrow their penicillin allergy.   Follow up in 6 months or sooner if needed.   Reducing Pollen Exposure . Pollen seasons: trees (spring), grass (summer) and ragweed/weeds (fall). 03-23-1981 Keep windows closed in your home and car to lower pollen exposure.  Marland Kitchen air conditioning in the bedroom and throughout the house if possible.  . Avoid going out in dry windy days - especially early morning. . Pollen counts are highest between 5 - 10 AM and on dry, hot and windy days.  . Save outside activities for late afternoon or after a heavy rain, when pollen levels are lower.  . Avoid mowing of grass if you have grass pollen allergy. Lilian Kapur Be aware that pollen can also be transported indoors on people and pets.  . Dry your clothes in an automatic dryer rather than hanging them outside where they might collect pollen.  . Rinse hair and eyes before bedtime.  Pet Allergen Avoidance: . Contrary to popular opinion, there are no "hypoallergenic" breeds of dogs or cats. That is because people are not allergic to an animal's hair, but to an allergen found in the animal's saliva, dander (dead skin flakes) or urine. Pet allergy symptoms typically occur within minutes. For some people, symptoms can build up and become most severe 8 to 12 hours after contact with the animal. People with severe allergies can experience reactions in public places if dander has been transported on the pet owners' clothing. Marland Kitchen Keeping an animal outdoors is only a partial solution, since homes with pets in the  yard still have higher concentrations of animal allergens. . Before getting a pet, ask your allergist to determine if you are allergic to animals. If your pet is already considered part of your family, try to minimize contact and keep the pet out of the bedroom and other rooms where you spend a great deal of time. . As with dust mites, vacuum carpets often or replace carpet with a hardwood floor, tile or  linoleum. . High-efficiency particulate air (HEPA) cleaners can reduce allergen levels over time. . While dander and saliva are the source of cat and dog allergens, urine is the source of allergens from rabbits, hamsters, mice and [place] pigs; so ask a non-allergic family member to clean the animal's cage. . If you have a pet allergy, talk to your allergist about the potential for allergy immunotherapy (allergy shots). This strategy can often provide long-term relief. Mold Control . Mold and fungi can grow on a variety of surfaces provided certain temperature and moisture conditions exist.  . Outdoor molds grow on plants, decaying vegetation and soil. The major outdoor mold, Alternaria and Cladosporium, are found in very high numbers during hot and dry conditions. Generally, a late summer - fall peak is seen for common outdoor fungal spores. Rain will temporarily lower outdoor mold spore count, but counts rise rapidly when the rainy period ends. . The most important indoor molds are Aspergillus and Penicillium. Dark, humid and poorly ventilated basements are ideal sites for mold growth. The next most common sites of mold growth are the bathroom and the kitchen. Outdoor (Seasonal) Mold Control . Use air conditioning and keep windows closed. . Avoid exposure to decaying vegetation. Marland Kitchen Avoid leaf raking. . Avoid grain handling. . Consider wearing a face mask if working in moldy areas.  Indoor (Perennial) Mold Control  . Maintain humidity below 50%. . Get rid of mold growth on hard surfaces with water, detergent and, if necessary, 5% bleach (do not mix with other cleaners). Then dry the area completely. If mold covers an area more than 10 square feet, consider hiring an indoor environmental professional. . For clothing, washing with soap and water is best. If moldy items cannot be cleaned and dried, throw them away. . Remove sources e.g. contaminated carpets. . Repair and seal leaking roofs or pipes.  Using dehumidifiers in damp basements may be helpful, but empty the water and clean units regularly to prevent mildew from forming. All rooms, especially basements, bathrooms and kitchens, require ventilation and cleaning to deter mold and mildew growth. Avoid carpeting on concrete or damp floors, and storing items in damp areas. Control of House Dust Mite Allergen . Dust mite allergens are a common trigger of allergy and asthma symptoms. While they can be found throughout the house, these microscopic creatures thrive in warm, humid environments such as bedding, upholstered furniture and carpeting. . Because so much time is spent in the bedroom, it is essential to reduce mite levels there.  . Encase pillows, mattresses, and box springs in special allergen-proof fabric covers or airtight, zippered plastic covers.  . Bedding should be washed weekly in hot water (130 F) and dried in a hot dryer. Allergen-proof covers are available for comforters and pillows that can't be regularly washed.  Reyes Ivan the allergy-proof covers every few months. Minimize clutter in the bedroom. Keep pets out of the bedroom.  Marland Kitchen Keep humidity less than 50% by using a dehumidifier or air conditioning. You can buy a humidity measuring device called a hygrometer to  monitor this.  . If possible, replace carpets with hardwood, linoleum, or washable area rugs. If that's not possible, vacuum frequently with a vacuum that has a HEPA filter. . Remove all upholstered furniture and non-washable window drapes from the bedroom. . Remove all non-washable stuffed toys from the bedroom.  Wash stuffed toys weekly. Cockroach Allergen Avoidance Cockroaches are often found in the homes of densely populated urban areas, schools or commercial buildings, but these creatures can lurk almost anywhere. This does not mean that you have a dirty house or living area. . Block all areas where roaches can enter the home. This includes crevices, wall cracks and  windows.  . Cockroaches need water to survive, so fix and seal all leaky faucets and pipes. Have an exterminator go through the house when your family and pets are gone to eliminate any remaining roaches. Marland Kitchen Keep food in lidded containers and put pet food dishes away after your pets are done eating. Vacuum and sweep the floor after meals, and take out garbage and recyclables. Use lidded garbage containers in the kitchen. Wash dishes immediately after use and clean under stoves, refrigerators or toasters where crumbs can accumulate. Wipe off the stove and other kitchen surfaces and cupboards regularly.  Skin care recommendations  Bath time: . Always use lukewarm water. AVOID very hot or cold water. Marland Kitchen Keep bathing time to 5-10 minutes. . Do NOT use bubble bath. . Use a mild soap and use just enough to wash the dirty areas. . Do NOT scrub skin vigorously.  . After bathing, pat dry your skin with a towel. Do NOT rub or scrub the skin.  Moisturizers and prescriptions:  . ALWAYS apply moisturizers immediately after bathing (within 3 minutes). This helps to lock-in moisture. . Use the moisturizer several times a day over the whole body. Peri Jefferson summer moisturizers include: Aveeno, CeraVe, Cetaphil. Peri Jefferson winter moisturizers include: Aquaphor, Vaseline, Cerave, Cetaphil, Eucerin, Vanicream. . When using moisturizers along with medications, the moisturizer should be applied about one hour after applying the medication to prevent diluting effect of the medication or moisturize around where you applied the medications. When not using medications, the moisturizer can be continued twice daily as maintenance.  Laundry and clothing: . Avoid laundry products with added color or perfumes. . Use unscented hypo-allergenic laundry products such as Tide free, Cheer free & gentle, and All free and clear.  . If the skin still seems dry or sensitive, you can try double-rinsing the clothes. . Avoid tight or scratchy  clothing such as wool. . Do not use fabric softeners or dyer sheets.

## 2021-01-27 NOTE — Progress Notes (Signed)
Follow Up Note  RE: Suzanne Jarvis MRN: [id number] DOB: [birth year] Date of Office Visit: 01/27/2021  Referring provider: Natalia Leatherwood, DO Primary care provider: Natalia Leatherwood, DO  Chief Complaint: Allergic Rhinitis  (Last week after allergy shots had a bad reaction - has pictures redness on ear and red patches on face and arms )  History of Present Illness: I had the pleasure of seeing Suzanne Jarvis for a follow up visit at the [facility] on 01/27/2021. She is a 30 y.o. female, who is being followed for allergic rhinoconjunctivitis on AIT, urticaria and penicillin allergy. Her previous allergy office visit was on 07/29/2020 with Dr. Selena Jarvis. Today is a regular follow up visit.  Seasonal and perennial allergic rhinoconjunctivitis Patient had some large localized reactions with her allergy injections but with the last injection on 3/8/222 she had a worse reaction.   About 1.5 hour afterwards noted left ear itching/swelling and broke out in patches on her face, neck and developed large localized reaction on her arm where the injection was given.   Patient usually takes Claritin in the mornings and took an additional zyrtec in the morning and benadryl at night with minimal benefit. Symptoms slowly improved the next few days.  No trouble breathing. Patient did not seek medical attention at that time and did not call our office.   Still taking Singulair at night and using azelastine nasal spray prn. Using eye drops daily with good benefit.  Using saline rinses sometimes.  Stopped Flonase due to nosebleeds.  Getting 1 sinus infection per month and saw ENT and had rhinoscopy which was apparently unremarkable.   She is going to see rheumatology due to positive ANA and some changes on her fingertips she noticed.   Urticaria No more hives.  Assessment and Plan: [name] is a 30 y.o. female with: Seasonal and perennial allergic rhinoconjunctivitis Past history -  Rhino conjunctivitis symptoms from the spring through fall for the past 5 years. 2 sinus infections per year. 2021 skin testing showed: Positive to grass, weed, ragweed, trees, cat, mold, dog, dust mites. Borderline to cockroach.  Negative to common foods. Started AIT on 05/20/2020 (G-[id number] and M-[id number]-DM) Interim history -  Had a reaction after the last injection with rashes on the face and larger than normal localized reactions. Patient self treated with additional antihistamines with good benefit. Saw ENT for epistaxis with normal work up.   Continue allergy injections - given today.  If you have a reactions next time let us know.  Patient was given the wrong dose of allergy injections at the last injection visit which is what caused the reaction above.   Patient has up to date Auvi-Q on hand.   Continue environmental control measures as below.  May use over the counter antihistamines such as Zyrtec (cetirizine), Claritin (loratadine), Allegra (fexofenadine), or Xyzal (levocetirizine) daily as needed. May take twice a day if needed. Continue with Singulair (montelukast) 10mg  daily at night.  May use olopatadine eye drops 0.1% twice a day as needed for itchy/watery eyes.  May use Flonase 1 spray per nostril twice a day for nasal congestion.  May use azelastine 1-2 sprays per nostril twice a day as needed for runny nose.  Nasal saline spray (i.e., Simply Saline) or nasal saline lavage (i.e., NeilMed) is recommended as needed and prior to medicated nasal sprays.  If still having issues with epistaxis - will try to switch to a nasal spray that is less irritating  such as Qnasl.   History of frequent upper respiratory infection Notes frequent sinus infections needing frequent antibiotics.  Keep track of infections. Get bloodwork to look at immune system.  Discussed with patient that environmental allergies can lead to more frequent sinus infections due to constant state of inflammation  in the sinuses.    Penicillin allergy Past history - Broke out in hives after taking penicillin.  Continue avoidance. Consider penicillin testing in future.   Return in about 6 months (around 07/30/2021).  Meds ordered this encounter  Medications  . Azelastine HCl 0.15 % SOLN    Sig: Place 1-2 sprays into the nose 2 (two) times daily as needed (runny nose).    Dispense:  30 mL    Refill:  5  . levocetirizine (XYZAL) 5 MG tablet    Sig: TAKE 1 TABLET(5 MG) BY MOUTH EVERY EVENING    Dispense:  90 tablet    Refill:  3  . montelukast (SINGULAIR) 10 MG tablet    Sig: Take 1 tablet (10 mg total) by mouth at bedtime.    Dispense:  90 tablet    Refill:  3  . olopatadine (PATANOL) 0.1 % ophthalmic solution    Sig: Place 1 drop into both eyes 2 (two) times daily as needed (itchy/watery eyes).    Dispense:  5 mL    Refill:  5    Lab Orders     Complement, total     IgG, IgA, IgM     Strep pneumoniae 23 Serotypes IgG     Diphtheria / Tetanus Antibody Panel  Diagnostics: None.   Medication List:  Current Outpatient Medications  Medication Sig Dispense Refill  . amLODipine (NORVASC) 2.5 MG tablet Take 1 tablet (2.5 mg total) by mouth daily. 90 tablet 1  . amphetamine-dextroamphetamine (ADDERALL) 10 MG tablet Take 1 tablet (10 mg total) by mouth 2 (two) times daily. 180 tablet 0  . ascorbic acid (VITAMIN C) 500 MG tablet Take by mouth.    . Biotin 65784 MCG TABS Take by mouth. 5,000 MCG daily    . Cyanocobalamin (B-12) 1000 MCG TABS Take 2 tablets by mouth daily.    Marland Kitchen dicyclomine (BENTYL) 10 MG capsule TAKE 1 CAPSULE BY MOUTH FOUR TIMES DAILY BEFORE MEALS AND AT BEDTIME PRN 120 capsule 3  . diphenoxylate-atropine (LOMOTIL) 2.5-0.025 MG tablet TAKE 1 TABLET BY MOUTH FOUR TIMES DAILY AS NEEDED FOR DIARRHEA OR LOOSE STOOLS 180 tablet 1  . drospirenone-ethinyl estradiol (YAZ,GIANVI,LORYNA) 3-0.02 MG tablet Take 1 tablet by mouth daily.    Marland Kitchen EPINEPHrine (AUVI-Q) 0.3 mg/0.3 mL IJ SOAJ  injection Inject 0.3 mLs (0.3 mg total) into the muscle as needed for anaphylaxis. 1 each 1  . Magnesium 100 MG TABS Take by mouth.    . Multiple Vitamins-Minerals (MULTIVITAMIN WITH MINERALS) tablet Take 1 tablet by mouth daily.    Marland Kitchen nystatin-triamcinolone (MYCOLOG II) cream SMARTSIG:Sparingly Topical 1 to 2 Times Daily PRN    . Vitamin D, Cholecalciferol, 25 MCG (1000 UT) CAPS Take by mouth.    . Azelastine HCl 0.15 % SOLN Place 1-2 sprays into the nose 2 (two) times daily as needed (runny nose). 30 mL 5  . fluticasone (FLONASE) 50 MCG/ACT nasal spray Place into both nostrils daily. (Patient not taking: Reported on 01/27/2021)    . levocetirizine (XYZAL) 5 MG tablet TAKE 1 TABLET(5 MG) BY MOUTH EVERY EVENING 90 tablet 3  . montelukast (SINGULAIR) 10 MG tablet Take 1 tablet (10 mg total) by mouth  at bedtime. 90 tablet 3  . olopatadine (PATANOL) 0.1 % ophthalmic solution Place 1 drop into both eyes 2 (two) times daily as needed (itchy/watery eyes). 5 mL 5   No current facility-administered medications for this visit.   Allergies: Allergies  Allergen Reactions  . Amoxicillin Hives  . Hyoscyamine Other (See Comments)  . Mixed Grasses   . Other      grass, weed, ragweed, trees, cat, mold, dog, dust mites. Borderline to cockroach.    . Paxil [Paroxetine Hcl]     Dizziness   . Penicillins     REACTION: hives   I reviewed her past medical history, social history, family history, and environmental history and no significant changes have been reported from her previous visit.  Review of Systems  Constitutional: Negative for appetite change, chills, fever and unexpected weight change.  HENT: Positive for congestion, postnasal drip, rhinorrhea and sneezing.   Eyes: Positive for itching.  Respiratory: Negative for cough, chest tightness, shortness of breath and wheezing.   Cardiovascular: Negative for chest pain.  Gastrointestinal: Negative for abdominal pain.  Genitourinary: Negative for  difficulty urinating.  Skin: Negative for rash.  Allergic/Immunologic: Positive for environmental allergies. Negative for food allergies.  Neurological: Negative for headaches.   Objective: BP 124/80   Pulse 80   Temp 98.1 F (36.7 C)   Resp 18   Ht 5\' 5"  (1.651 m)   Wt 126 lb 6.4 oz (57.3 kg)   SpO2 98%   BMI 21.03 kg/m  Body mass index is 21.03 kg/m. Physical Exam Vitals and nursing note reviewed.  Constitutional:      Appearance: Normal appearance. She is well-developed and normal weight.  HENT:     Head: Normocephalic and atraumatic.     Right Ear: Tympanic membrane and external ear normal.     Left Ear: Tympanic membrane and external ear normal.     Nose: Nose normal.     Mouth/Throat:     Mouth: Mucous membranes are moist.     Pharynx: Oropharynx is clear.  Eyes:     Conjunctiva/sclera: Conjunctivae normal.  Cardiovascular:     Rate and Rhythm: Normal rate and regular rhythm.     Heart sounds: Normal heart sounds. No murmur heard. No friction rub. No gallop.   Pulmonary:     Effort: Pulmonary effort is normal.     Breath sounds: Normal breath sounds. No wheezing, rhonchi or rales.  Abdominal:     Palpations: Abdomen is soft.  Musculoskeletal:     Cervical back: Neck supple.  Skin:    General: Skin is warm.     Findings: No rash.  Neurological:     Mental Status: She is alert and oriented to person, place, and time.  Psychiatric:        Mood and Affect: Mood normal.        Behavior: Behavior normal.    Previous notes and tests were reviewed. The plan was reviewed with the patient/family, and all questions/concerned were addressed.  It was my pleasure to see Alicha today and participate in her care. Please feel free to contact me with any questions or concerns.  Sincerely,  Aggie Cosier, DO Allergy & Immunology  [facility] office: [phone] [facility] office: [phone]

## 2021-01-27 NOTE — Assessment & Plan Note (Signed)
Past history - Broke out in hives after taking penicillin.  Continue avoidance. Consider penicillin testing in future.

## 2021-01-29 ENCOUNTER — Ambulatory Visit: Payer: 59 | Admitting: Psychology

## 2021-02-02 ENCOUNTER — Ambulatory Visit (INDEPENDENT_AMBULATORY_CARE_PROVIDER_SITE_OTHER): Payer: 59 | Admitting: *Deleted

## 2021-02-02 DIAGNOSIS — J309 Allergic rhinitis, unspecified: Secondary | ICD-10-CM | POA: Diagnosis not present

## 2021-02-11 ENCOUNTER — Ambulatory Visit (INDEPENDENT_AMBULATORY_CARE_PROVIDER_SITE_OTHER): Payer: 59 | Admitting: *Deleted

## 2021-02-11 DIAGNOSIS — J309 Allergic rhinitis, unspecified: Secondary | ICD-10-CM

## 2021-02-12 LAB — STREP PNEUMONIAE 23 SEROTYPES IGG
Pneumo Ab Type 1*: 1.3 ug/mL — ABNORMAL LOW (ref >1.3)
Pneumo Ab Type 12 (12F)*: 0.1 ug/mL — ABNORMAL LOW (ref >1.3)
Pneumo Ab Type 14*: 2.1 ug/mL (ref >1.3)
Pneumo Ab Type 17 (17F)*: 3.8 ug/mL (ref >1.3)
Pneumo Ab Type 19 (19F)*: 1.8 ug/mL (ref >1.3)
Pneumo Ab Type 2*: 1.9 ug/mL (ref >1.3)
Pneumo Ab Type 20*: 2.6 ug/mL (ref >1.3)
Pneumo Ab Type 22 (22F)*: 0.6 ug/mL — ABNORMAL LOW (ref >1.3)
Pneumo Ab Type 23 (23F)*: 0.1 ug/mL — ABNORMAL LOW (ref >1.3)
Pneumo Ab Type 26 (6B)*: 0.3 ug/mL — ABNORMAL LOW (ref >1.3)
Pneumo Ab Type 3*: 4.2 ug/mL (ref >1.3)
Pneumo Ab Type 34 (10A)*: 0.8 ug/mL — ABNORMAL LOW (ref >1.3)
Pneumo Ab Type 4*: 0.5 ug/mL — ABNORMAL LOW (ref >1.3)
Pneumo Ab Type 43 (11A)*: 0.8 ug/mL — ABNORMAL LOW (ref >1.3)
Pneumo Ab Type 5*: 1 ug/mL — ABNORMAL LOW (ref >1.3)
Pneumo Ab Type 51 (7F)*: 2.1 ug/mL (ref >1.3)
Pneumo Ab Type 54 (15B)*: 4.3 ug/mL (ref >1.3)
Pneumo Ab Type 56 (18C)*: 2 ug/mL (ref >1.3)
Pneumo Ab Type 57 (19A)*: 3.7 ug/mL (ref >1.3)
Pneumo Ab Type 68 (9V)*: 0.7 ug/mL — ABNORMAL LOW (ref >1.3)
Pneumo Ab Type 70 (33F)*: 0.4 ug/mL — ABNORMAL LOW (ref >1.3)
Pneumo Ab Type 8*: 0.3 ug/mL — ABNORMAL LOW (ref >1.3)
Pneumo Ab Type 9 (9N)*: 0.1 ug/mL — ABNORMAL LOW (ref >1.3)

## 2021-02-12 LAB — DIPHTHERIA / TETANUS ANTIBODY PANEL
Diphtheria Ab: 0.26 IU/mL (ref <0.10)
Tetanus Ab, IgG: 1.75 IU/mL (ref <0.10)

## 2021-02-12 LAB — IGG, IGA, IGM
IgA/Immunoglobulin A, Serum: 387 mg/dL — ABNORMAL HIGH (ref 87–352)
IgG (Immunoglobin G), Serum: 1114 mg/dL (ref 586–1602)
IgM (Immunoglobulin M), Srm: 73 mg/dL (ref 26–217)

## 2021-02-12 LAB — COMPLEMENT, TOTAL: Compl, Total (CH50): >60 U/mL (ref >41)

## 2021-02-16 ENCOUNTER — Ambulatory Visit (INDEPENDENT_AMBULATORY_CARE_PROVIDER_SITE_OTHER): Payer: 59 | Admitting: *Deleted

## 2021-02-16 DIAGNOSIS — J309 Allergic rhinitis, unspecified: Secondary | ICD-10-CM

## 2021-02-18 ENCOUNTER — Telehealth: Payer: Self-pay | Admitting: *Deleted

## 2021-02-18 NOTE — Telephone Encounter (Signed)
Patient called today and stated that the evening she got her allergy injection she broke out in hives on her face and her RA was a 5+ at the injection site, she states it was larger than a half dollar. She states that she took a Benadryl and the hives are gone and she has not had any further issues. She inj in her RA was her pollens at .89mL. She states that she is taking Montelukast and Xyzal at night and alternates between Claritin and Zyrtec in the morning. Please advise for patient's next inj. Thank You.

## 2021-02-22 ENCOUNTER — Ambulatory Visit (INDEPENDENT_AMBULATORY_CARE_PROVIDER_SITE_OTHER): Payer: 59

## 2021-02-22 DIAGNOSIS — J309 Allergic rhinitis, unspecified: Secondary | ICD-10-CM | POA: Diagnosis not present

## 2021-02-22 NOTE — Telephone Encounter (Signed)
Please advise 

## 2021-02-22 NOTE — Telephone Encounter (Signed)
Please HOLD RED vial at 0.15cc x 4 weeks then increase via schedule A.  Thank you.

## 2021-02-23 NOTE — Telephone Encounter (Signed)
Patient's allergy flow sheet has been updated to reflect these changes.  

## 2021-02-23 NOTE — Progress Notes (Signed)
Office Visit Note  Patient: Suzanne Jarvis             Date of Birth: 11-18-1990           MRN: [id number]             PCP: [name], DO Referring: [name], DO Visit Date: 02/24/2021 Occupation: Hair stylist  Subjective:  New Patient (Initial Visit) (Patient complains of burning sensation in bilateral hands and feet with color change. 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 )   History of Present Illness: Suzanne Jarvis is a 30 y.o. female here for evaluation of positive ANA associated with raynaud's phenomenon redness in the hands and feet. She has longstanding raynaud's symptoms with episodic pallor and cyanosis in her fingers. However during the past approximately 6 months she experieces episodes of redness and swelling in her hands and feet with hot skin temperature and a burning pain sensation. This lasts for minutes to a few hours per episode. She has not noticed specific provocative stimuli. These episodes are not preceded by raynaud's symptoms and are not provoked with the cold exposure. She has noticed it more frequently over time. Besides these episodes she also has had some oral ulcers that came and went. Redness and burning developed on the face but less frequently. She has chronic, longstanding and severe allergic rhinitis type symptoms with persistent sinus infections.  ANA 1:80 homogenous Reflex ENA panel neg IgA 387 Eos 5.4  Activities of Daily Living:  Patient reports morning stiffness for 0 minutes.   Patient Reports nocturnal pain.  Difficulty dressing/grooming: Denies Difficulty climbing stairs: Denies Difficulty getting out of chair: Denies Difficulty using hands for taps, buttons, cutlery, and/or writing: Reports  Review of Systems  Constitutional: Positive for fatigue.  HENT: Positive for mouth sores and nose dryness. Negative for mouth dryness.   Eyes: Positive for itching and  visual disturbance. Negative for pain and dryness.  Respiratory: Positive for cough. Negative for hemoptysis, shortness of breath and difficulty breathing.   Cardiovascular: Positive for chest pain and palpitations. Negative for swelling in legs/feet.  Gastrointestinal: Positive for abdominal pain, constipation and diarrhea. Negative for blood in stool.  Endocrine: Negative for increased urination.  Genitourinary: Negative for painful urination.  Musculoskeletal: Positive for arthralgias, joint pain and joint swelling. Negative for myalgias, muscle weakness, morning stiffness, muscle tenderness and myalgias.  Skin: Positive for color change, rash and redness.  Allergic/Immunologic: Positive for susceptible to infections.  Neurological: Positive for dizziness, headaches, memory loss and weakness. Negative for numbness.  Hematological: Positive for swollen glands.  Psychiatric/Behavioral: Positive for confusion and sleep disturbance.    PMFS History:  Patient Active Problem List   Diagnosis Date Noted  . Positive ANA (antinuclear antibody) 02/24/2021  . History of frequent upper respiratory infection 01/27/2021  . Seasonal allergies 01/14/2021  . Rash and other nonspecific skin eruption 12/30/2020  . Seasonal and perennial allergic rhinoconjunctivitis 07/28/2020  . Concussion with no loss of consciousness 07/08/2020  . ADD (attention deficit disorder) 07/08/2020  . Penicillin allergy 04/28/2020  . IBS (irritable bowel syndrome) 09/30/2015  . Erythema migrans (Lyme disease) 07/15/2015  . Vitamin B 12 deficiency 12/10/2014  . Other allergic rhinitis 02/12/2009    Past Medical History:  Diagnosis Date  . ADD (attention deficit disorder) 07/08/2020  . Angio-edema   . ASCUS with positive high risk HPV cervical 03/26/2019   01/2019; negative colposcopy 03/2019;  plan repeat pap and HPV test 01/2020  . Asthma   . Chronic headaches   . Concussion with no loss of consciousness 07/08/2020   May  2021  . Eczema   . IBS (irritable bowel syndrome)   . Penicillin allergy 04/28/2020  . Seasonal and perennial allergic rhinoconjunctivitis 07/28/2020  . Tonsillitis   . Urticaria     Family History  Problem Relation Age of Onset  . Allergies Mother   . Asthma Mother   . Hypertension Mother   . Allergic rhinitis Mother   . Urticaria Mother   . Arthritis Maternal Grandmother   . Irritable bowel syndrome Maternal Grandmother   . Breast cancer Maternal Grandmother   . Allergic rhinitis Sister   . Eczema Sister   . Anemia Sister   . Hashimoto's thyroiditis Brother   . ADD / ADHD Brother   . Lung cancer Other        paternal great grandfather  . Lung cancer Other        maternal great grandfather  . Cancer Other        Blood-great grandmother  . Lupus Cousin   . Hashimoto's thyroiditis Cousin   . Colon cancer Neg Hx    Past Surgical History:  Procedure Laterality Date  . WISDOM TOOTH EXTRACTION     Social History   Social History Narrative  . Not on file   Immunization History  Administered Date(s) Administered  . Influenza Split 11/18/2011, 11/26/2012  . Influenza,inj,Quad PF,6+ Mos 08/08/2013, 09/05/2014, 08/14/2015, 08/24/2016, 08/26/2017, 08/29/2018, 09/18/2019, 09/09/2020  . PFIZER(Purple Top)SARS-COV-2 Vaccination 03/31/2020, 04/21/2020, 11/10/2020  . Td 06/16/2008  . Tdap 01/29/2020     Objective: Vital Signs: BP 132/89 (BP Location: Right Arm, Patient Position: Sitting, Cuff Size: Normal)   Pulse 78   Ht $R'5\' 4"'PH$  (1.626 m)   Wt 125 lb 3.2 oz (56.8 kg)   BMI 21.49 kg/m    Physical Exam HENT:     Right Ear: External ear normal.     Left Ear: External ear normal.     Mouth/Throat:     Mouth: Mucous membranes are moist.     Pharynx: Oropharynx is clear.  Eyes:     Conjunctiva/sclera: Conjunctivae normal.  Cardiovascular:     Rate and Rhythm: Normal rate and regular rhythm.  Pulmonary:     Effort: Pulmonary effort is normal.     Breath sounds: Normal  breath sounds.  Skin:    General: Skin is warm and dry.     Comments: Livedo reticularis faint on bilateral arms inner half No blisters, lesions, telangiectasias Nailfold capillaroscopy with some tortuous vessels many normal appearance  Neurological:     Mental Status: She is alert.     Musculoskeletal Exam:  Neck full ROM no tenderness Shoulders full ROM no tenderness or swelling Elbows full ROM no tenderness or swelling Wrists full ROM no tenderness or swelling Fingers full ROM no tenderness or swelling Knees full ROM no tenderness or swelling Ankles full ROM no tenderness or swelling MTPs full ROM no tenderness or swelling   Investigation: No additional findings.  Imaging: No results found.  Recent Labs: Lab Results  Component Value Date   WBC 4.8 01/13/2021   HGB 14.3 01/13/2021   PLT 184.0 01/13/2021   NA 136 01/13/2021   K 3.7 01/13/2021   CL 102 01/13/2021   CO2 26 01/13/2021   GLUCOSE 85 01/13/2021   BUN 9 01/13/2021   CREATININE 0.76 01/13/2021   BILITOT 0.5  01/13/2021   ALKPHOS 39 01/13/2021   AST 17 01/13/2021   ALT 13 01/13/2021   PROT 7.1 01/13/2021   ALBUMIN 4.2 01/13/2021   CALCIUM 9.5 01/13/2021   GFRAA >60 09/24/2018    Speciality Comments: No specialty comments available.  Procedures:  No procedures performed Allergies: Amlodipine, Amoxicillin, Hyoscyamine, Mixed grasses, Novocain [procaine], Other, Paxil [paroxetine hcl], and Penicillins   Assessment / Plan:     Visit Diagnoses: Rash and other nonspecific skin eruption - Plan: Sedimentation rate, Serum protein electrophoresis with reflex, Pan-ANCA, C-reactive protein, RPR  Skin rashes look and sound suggestive for erythromelalgia although this symptom associations are often fairly nonspecific. CBC reviewed was normal making myeloproliferative disease unlikely. Checking ESR, SPEP, ANCAs, CRP, RPR screening for vasculitis associations and inflammation.  Positive ANA (antinuclear  antibody)  Positive ANA with no clinical criteria for SLE and negative reflex ENAs panel so low suspicion for a specific systemic CTD. Can sometimes be nonspecific or undifferentiated disease but no specific tests indicated.  Attention deficit disorder (ADD) without hyperactivity  Currently on low dose sympathomimetics since her concussion injury but timing is not closely associated with symptoms onset or progression.  Orders: Orders Placed This Encounter  Procedures  . Sedimentation rate  . Serum protein electrophoresis with reflex  . Pan-ANCA  . C-reactive protein  . RPR   No orders of the defined types were placed in this encounter.   Follow-Up Instructions: Return in about 2 weeks (around 03/10/2021) for Rashes, pos ANA.   Collier Salina, MD  Note - This record has been created using [employer].  Chart creation errors have been sought, but may not always  have been located. Such creation errors do not reflect on  the standard of medical care.

## 2021-02-24 ENCOUNTER — Other Ambulatory Visit: Payer: Self-pay

## 2021-02-24 ENCOUNTER — Ambulatory Visit: Payer: 59 | Admitting: Internal Medicine

## 2021-02-24 ENCOUNTER — Encounter: Payer: Self-pay | Admitting: Internal Medicine

## 2021-02-24 VITALS — BP 132/89 | HR 78 | Ht 64.0 in | Wt 125.2 lb

## 2021-02-24 DIAGNOSIS — F988 Other specified behavioral and emotional disorders with onset usually occurring in childhood and adolescence: Secondary | ICD-10-CM

## 2021-02-24 DIAGNOSIS — R768 Other specified abnormal immunological findings in serum: Secondary | ICD-10-CM | POA: Diagnosis not present

## 2021-02-24 DIAGNOSIS — R21 Rash and other nonspecific skin eruption: Secondary | ICD-10-CM

## 2021-02-24 NOTE — Patient Instructions (Addendum)
I am checking inflammatory markers ESR, CRP also screening for underlying conditions that can causes these symptoms including vasculitis, syphilis, or disorders with the blood cells.   Erythrocyte Sedimentation Rate Test Why am I having this test? The erythrocyte sedimentation rate (ESR) test is used to help find illnesses related to:  Sudden (acute) or long-term (chronic) infections.  Inflammation.  The body's disease-fighting system attacking healthy cells (autoimmune diseases).  Cancer.  Tissue death. If you have symptoms that may be related to any of these illnesses, your health care provider may do an ESR test before doing more specific tests. If you have an inflammatory immune disease, such as rheumatoid arthritis, you may have this test to help monitor your therapy. What is being tested? This test measures how long it takes for your red blood cells (erythrocytes) to settle in a solution over a certain amount of time (sedimentation rate). When you have an infection or inflammation, your red blood cells clump together and settle faster. The sedimentation rate provides information about how much inflammation is present in the body. What kind of sample is taken? A blood sample is required for this test. It is usually collected by inserting a needle into a blood vessel.   How do I prepare for this test? Follow any instructions from your health care provider about changing or stopping your regular medicines. Tell a health care provider about:  Any allergies you have.  All medicines you are taking, including vitamins, herbs, eye drops, creams, and over-the-counter medicines.  Any blood disorders you have.  Any surgeries you have had.  Any medical conditions you have, such as thyroid or kidney disease.  Whether you are pregnant or may be pregnant. How are the results reported? Your results will be reported as a value that measures sedimentation rate in millimeters per hour  (mm/hr). Your health care provider will compare your results to normal ranges that were established after testing a large group of people (reference values). Reference values may vary among labs and hospitals. For this test, common reference values, which vary by age and gender, are:  Newborn: 0-2 mm/hr.  Child, up to puberty: 0-10 mm/hr.  Female: ? Under 50 years: 0-20 mm/hr. ? 50-85 years: 0-30 mm/hr. ? Over 85 years: 0-42 mm/hr.  Female: ? Under 50 years: 0-15 mm/hr. ? 50-85 years: 0-20 mm/hr. ? Over 85 years: 0-30 mm/hr. Certain conditions or medicines may cause ESR levels to be falsely lower or higher, such as:  Pregnancy.  Obesity.  Steroids, birth control pills, and blood thinners.  Thyroid or kidney disease. What do the results mean? Results that are within reference values are considered normal, meaning that the level of inflammation in your body is healthy. High ESR levels mean that there is inflammation in your body. You will have more tests to help make a diagnosis. Inflammation may result from many different conditions or injuries. Talk with your health care provider about what your results mean. Questions to ask your health care provider Ask your health care provider, or the department that is doing the test:  When will my results be ready?  How will I get my results?  What are my treatment options?  What other tests do I need?  What are my next steps? Summary  The erythrocyte sedimentation rate (ESR) test is used to help find illnesses associated with sudden (acute) or long-term (chronic) infections, inflammation, autoimmune diseases, cancer, or tissue death.  If you have symptoms that may be related to  any of these illnesses, your health care provider may do an ESR test before doing more specific tests. If you have an inflammatory immune disease, such as rheumatoid arthritis, you may have this test to help monitor your therapy.  This test measures how long  it takes for your red blood cells (erythrocytes) to settle in a solution over a certain amount of time (sedimentation rate). This provides information about how much inflammation is present in the body.    C-Reactive Protein Test Why am I having this test? The C-reactive protein (CRP) is a substance that the liver releases in response to inflammation within the body. You may have a CRP test to help diagnose:  Serious bacterial or fungal infections.  Inflammatory diseases, such as inflammatory bowel disease.  Lupus, rheumatoid arthritis, or other autoimmune diseases. What is being tested? This test checks for the level of CRP in your blood. The level of CRP in your body increases greatly after a heart attack, infection, or injury. What kind of sample is taken? A blood sample is required for this test. It is usually collected by inserting a needle into a blood vessel.   Tell a health care provider about:  All medicines you are taking, including vitamins, herbs, eye drops, creams, and over-the-counter medicines.  Any medical conditions you have.  The use of birth control pills or hormone replacement therapy such as estrogen.  Any blood disorders you have.  Whether you are pregnant or may be pregnant. How are the results reported? Your test results will be reported as a value that indicates how much CRP is in your blood. This will be reported as milligrams of CRP per liter (mg/L) of blood. Your health care provider will compare your results to normal ranges that were established after testing a large group of people (reference ranges). Reference ranges may vary among labs and hospitals. For this test, the standard CRP reference value is less than 10 mg/L. What do the results mean? A standard CRP test result that is less than 10 mg/L is considered normal, meaning that you do not have an abnormally high level of inflammation in your body. A standard CRP test result that is greater than 10  mg/L means that there is an abnormally high level of inflammation in your body that is causing CRP to be released. Inflammation may result from many different conditions or injuries. You will have more tests to help make a diagnosis. Talk with your health care provider about what your results mean. Questions to ask your health care provider Ask your health care provider, or the department that is doing the test:  When will my results be ready?  How will I get my results?  What are my treatment options?  What other tests do I need?  What are my next steps? Summary  C-reactive protein (CPR) is a substance released by the liver in response to inflammation within the body.  The CRP test may be performed to help diagnose infection and inflammatory conditions.  Talk with your health care provider about what your results mean.

## 2021-02-26 LAB — RPR: RPR Ser Ql: NONREACTIVE

## 2021-02-26 LAB — PROTEIN ELECTROPHORESIS, SERUM, WITH REFLEX
Albumin ELP: 4.2 g/dL (ref 3.8–4.8)
Alpha 1: 0.4 g/dL — ABNORMAL HIGH (ref 0.2–0.3)
Alpha 2: 0.8 g/dL (ref 0.5–0.9)
Beta 2: 0.4 g/dL (ref 0.2–0.5)
Beta Globulin: 0.5 g/dL (ref 0.4–0.6)
Gamma Globulin: 1.1 g/dL (ref 0.8–1.7)
Total Protein: 7.4 g/dL (ref 6.1–8.1)

## 2021-02-26 LAB — PAN-ANCA
ANCA Screen: NEGATIVE
Myeloperoxidase Abs: <1 AI
Serine Protease 3: <1 AI

## 2021-02-26 LAB — SEDIMENTATION RATE: Sed Rate: 6 mm/h (ref 0–20)

## 2021-02-26 LAB — C-REACTIVE PROTEIN: CRP: 3.2 mg/L (ref <8.0)

## 2021-03-02 ENCOUNTER — Ambulatory Visit (INDEPENDENT_AMBULATORY_CARE_PROVIDER_SITE_OTHER): Payer: 59 | Admitting: *Deleted

## 2021-03-02 DIAGNOSIS — J309 Allergic rhinitis, unspecified: Secondary | ICD-10-CM

## 2021-03-03 ENCOUNTER — Ambulatory Visit (INDEPENDENT_AMBULATORY_CARE_PROVIDER_SITE_OTHER): Payer: 59 | Admitting: Psychology

## 2021-03-03 DIAGNOSIS — F411 Generalized anxiety disorder: Secondary | ICD-10-CM

## 2021-03-09 NOTE — Progress Notes (Signed)
Office Visit Note  Patient: Suzanne Jarvis             Date of Birth: 18-May-1991           MRN: [id number]             PCP: Natalia Leatherwood, DO Referring: Natalia Leatherwood, DO Visit Date: 03/10/2021   Subjective:  Follow-up (Patient denies changes in symptoms since last visit. )   History of Present Illness: Suzanne Jarvis is a 30 y.o. female here for follow up for evaluation with the intermittent discoloration and swelling and burning pain in her hands and feet.  She has noticed several more episodes since her initial visit in particular experience once while standing for a long time bilateral pallor in her feet and burning pain and erythema in both hands.  She is also concerned on account of persistent sinus and upper respiratory symptoms continuously attributed to allergies despite adherence to her allergy injection treatments and home medications.    Review of Systems  Constitutional: Positive for fatigue.  HENT: Negative for mouth dryness and nose dryness.   Eyes: Positive for itching, visual disturbance and dryness. Negative for pain.  Respiratory: Positive for cough. Negative for hemoptysis, shortness of breath and difficulty breathing.   Cardiovascular: Positive for palpitations and swelling in legs/feet. Negative for chest pain.  Gastrointestinal: Positive for abdominal pain, blood in stool, constipation and diarrhea.  Endocrine: Negative for increased urination.  Genitourinary: Negative for painful urination.  Musculoskeletal: Positive for arthralgias, joint pain, joint swelling, myalgias, muscle weakness, morning stiffness, muscle tenderness and myalgias.  Skin: Positive for color change, rash and redness.  Allergic/Immunologic: Positive for susceptible to infections.  Neurological: Positive for dizziness, headaches and memory loss. Negative for numbness and weakness.  Hematological: Negative for swollen glands.  Psychiatric/Behavioral: Positive for confusion and sleep  disturbance.    PMFS History:  Patient Active Problem List   Diagnosis Date Noted  . Positive ANA (antinuclear antibody) 02/24/2021  . History of frequent upper respiratory infection 01/27/2021  . Seasonal allergies 01/14/2021  . Rash and other nonspecific skin eruption 12/30/2020  . Seasonal and perennial allergic rhinoconjunctivitis 07/28/2020  . Concussion with no loss of consciousness 07/08/2020  . ADD (attention deficit disorder) 07/08/2020  . Penicillin allergy 04/28/2020  . IBS (irritable bowel syndrome) 09/30/2015  . Erythema migrans (Lyme disease) 07/15/2015  . Vitamin B 12 deficiency 12/10/2014  . Other allergic rhinitis 02/12/2009    Past Medical History:  Diagnosis Date  . ADD (attention deficit disorder) 07/08/2020  . Angio-edema   . ASCUS with positive high risk HPV cervical 03/26/2019   01/2019; negative colposcopy 03/2019; plan repeat pap and HPV test 01/2020  . Asthma   . Chronic headaches   . Concussion with no loss of consciousness 07/08/2020   May 2021  . Eczema   . IBS (irritable bowel syndrome)   . Penicillin allergy 04/28/2020  . Seasonal and perennial allergic rhinoconjunctivitis 07/28/2020  . Tonsillitis   . Urticaria     Family History  Problem Relation Age of Onset  . Allergies Mother   . Asthma Mother   . Hypertension Mother   . Allergic rhinitis Mother   . Urticaria Mother   . Arthritis Maternal Grandmother   . Irritable bowel syndrome Maternal Grandmother   . Breast cancer Maternal Grandmother   . Allergic rhinitis Sister   . Eczema Sister   . Anemia Sister   . Hashimoto's thyroiditis Brother   .  ADD / ADHD Brother   . Lung cancer Other        paternal great grandfather  . Lung cancer Other        maternal great grandfather  . Cancer Other        Blood-great grandmother  . Lupus Cousin   . Hashimoto's thyroiditis Cousin   . Colon cancer Neg Hx    Past Surgical History:  Procedure Laterality Date  . WISDOM TOOTH EXTRACTION      Social History   Social History Narrative  . Not on file   Immunization History  Administered Date(s) Administered  . Influenza Split 11/18/2011, 11/26/2012  . Influenza,inj,Quad PF,6+ Mos 08/08/2013, 09/05/2014, 08/14/2015, 08/24/2016, 08/26/2017, 08/29/2018, 09/18/2019, 09/09/2020  . PFIZER(Purple Top)SARS-COV-2 Vaccination 03/31/2020, 04/21/2020, 11/10/2020  . Td 06/16/2008  . Tdap 01/29/2020     Objective: Vital Signs: BP 121/84 (BP Location: Left Arm, Patient Position: Sitting, Cuff Size: Normal)   Pulse 85   Ht 5\' 4"  (1.626 m)   Wt 126 lb 11.2 oz (57.5 kg)   BMI 21.75 kg/m    Physical Exam Eyes:     Conjunctiva/sclera: Conjunctivae normal.  Skin:    General: Skin is warm and dry.     Comments: Mild erythema in the left thumb today  Neurological:     General: No focal deficit present.     Mental Status: She is alert.  Psychiatric:        Mood and Affect: Mood normal.      Musculoskeletal Exam:  Elbows full ROM no tenderness or swelling Wrists full ROM no tenderness or swelling Fingers full ROM no tenderness or swelling  Investigation: No additional findings.  Imaging: No results found.  Recent Labs: Lab Results  Component Value Date   WBC 4.8 01/13/2021   HGB 14.3 01/13/2021   PLT 184.0 01/13/2021   NA 136 01/13/2021   K 3.7 01/13/2021   CL 102 01/13/2021   CO2 26 01/13/2021   GLUCOSE 85 01/13/2021   BUN 9 01/13/2021   CREATININE 0.76 01/13/2021   BILITOT 0.5 01/13/2021   ALKPHOS 39 01/13/2021   AST 17 01/13/2021   ALT 13 01/13/2021   PROT 7.4 02/24/2021   ALBUMIN 4.2 01/13/2021   CALCIUM 9.5 01/13/2021   GFRAA >60 09/24/2018    Speciality Comments: No specialty comments available.  Procedures:  No procedures performed Allergies: Amlodipine, Amoxicillin, Hyoscyamine, Mixed grasses, Novocain [procaine], Other, Paxil [paroxetine hcl], and Penicillins   Assessment / Plan:     Visit Diagnoses: Rash and other nonspecific skin  eruption Erythromelalgia (HCC)  Recently described features with extremity erythema burning swelling and findings on her feet and exam last visit suspect for erythromelalgia as possible explanation of her symptoms.  No underlying cause known at this time.  She did not tolerate calcium channel blockers at all. Recommend trial of full dose aspirin once daily for the short-term should be noticeable within a couple weeks as she has very frequent episodes.  If there is no improvement on this can consider treatment such as SNRIs or Neurontin.  Other allergic rhinitis  Not completely sure at this time how her refractory and chronic allergic rhinitis is related to the intermittent erythema discoloration of hands and feet also has dermatographism but not typical presentation or lab findings for mast cell dysfunction.  Positive ANA (antinuclear antibody)  Positive ANA but lacks specific ENA serology positivity and does not show typical clinical criteria of systemic lupus.  Based on this I do not  suspect a systemic connective tissue disease currently underlying her symptoms.  Also with normal complements and negative ANCA's low suspicion of autoimmune vasculitis process at this time.   Orders: No orders of the defined types were placed in this encounter.  No orders of the defined types were placed in this encounter.    Follow-Up Instructions: Return in about 5 weeks (around 04/14/2021) for Possible atypical raynaud's or erythromelalgia f/u on ASA.   Fuller Plan, MD  Note - This record has been created using [employer].  Chart creation errors have been sought, but may not always  have been located. Such creation errors do not reflect on  the standard of medical care.

## 2021-03-10 ENCOUNTER — Ambulatory Visit: Payer: 59 | Admitting: Internal Medicine

## 2021-03-10 ENCOUNTER — Ambulatory Visit (INDEPENDENT_AMBULATORY_CARE_PROVIDER_SITE_OTHER): Payer: 59

## 2021-03-10 ENCOUNTER — Other Ambulatory Visit: Payer: Self-pay

## 2021-03-10 ENCOUNTER — Encounter: Payer: Self-pay | Admitting: Internal Medicine

## 2021-03-10 VITALS — BP 121/84 | HR 85 | Ht 64.0 in | Wt 126.7 lb

## 2021-03-10 DIAGNOSIS — J3089 Other allergic rhinitis: Secondary | ICD-10-CM

## 2021-03-10 DIAGNOSIS — R7689 Other specified abnormal immunological findings in serum: Secondary | ICD-10-CM

## 2021-03-10 DIAGNOSIS — R768 Other specified abnormal immunological findings in serum: Secondary | ICD-10-CM

## 2021-03-10 DIAGNOSIS — I7381 Erythromelalgia: Secondary | ICD-10-CM

## 2021-03-10 DIAGNOSIS — J309 Allergic rhinitis, unspecified: Secondary | ICD-10-CM | POA: Diagnosis not present

## 2021-03-10 DIAGNOSIS — R21 Rash and other nonspecific skin eruption: Secondary | ICD-10-CM

## 2021-03-10 NOTE — Patient Instructions (Addendum)
Your symptoms may represent a form of a condition called erythromelalgia. This can occur alongside problems like raynaud's phenomenon, neuropathy, or inflammatory disorders. For some individuals symptoms are improved with antiplatelet medication such as aspirin. I recommend trial of aspirin 325 mg daily for a short time to see if there is an improvement in symptoms that would indicate primarily a vascular or platelet problem as the cause.  This condition sometimes does not respond well to treatments, and may require ongoing investigation or trying alternative treatments.  I do not see any current evidence of an underlying inflammatory disease on testing  Aspirin Tablets What is this medicine? ASPIRIN (AS pir in) is a pain reliever. It is used to treat mild pain and fever. This medicine is also used as directed by a doctor to prevent and to treat heart attacks, to prevent strokes and blood clots, and to treat arthritis or inflammation. This medicine may be used for other purposes; ask your health care provider or pharmacist if you have questions. COMMON BRAND NAME(S): Aspir-Low, Aspir-Trin, Aspirtab, Bayer Advanced Aspirin, Bayer Aspirin, Bayer Aspirin Extra Strength, Bayer Aspirin Plus, Surveyor, quantity, Surveyor, quantity Plus, Bayer Genuine Aspirin, Bayer Womens Aspirin, Bufferin, Bufferin Extra Strength, Bufferin Low Dose What should I tell my health care provider before I take this medicine? They need to know if you have any of these conditions:  anemia  asthma  bleeding problems  child with chickenpox, the flu, or other viral infection  diabetes  gout  if you frequently drink alcohol containing drinks  kidney disease  liver disease  low level of vitamin K  lupus  smoke tobacco  stomach ulcers or other problems  an unusual or allergic reaction to aspirin, tartrazine dye, other medicines, dyes, or preservatives  pregnant or trying to get  pregnant  breast-feeding How should I use this medicine? Take this medicine by mouth with a glass of water. Follow the directions on the package or prescription label. You can take this medicine with or without food. If it upsets your stomach, take it with food. Do not take your medicine more often than directed. Talk to your pediatrician regarding the use of this medicine in children. While this drug may be prescribed for children as young as 49 years of age for selected conditions, precautions do apply. Children and teenagers should not use this medicine to treat chicken pox or flu symptoms unless directed by a doctor. Patients over 31 years old may have a stronger reaction and need a smaller dose. Overdosage: If you think you have taken too much of this medicine contact a poison control center or emergency room at once. NOTE: This medicine is only for you. Do not share this medicine with others. What if I miss a dose? If you are taking this medication on a regular schedule and miss a dose, take it as soon as you can. If it is almost time for your next dose, take only that dose. Do not take double or extra doses. What may interact with this medicine? Do not take this medicine with any of the following medications:  cidofovir  ketorolac  probenecid This medicine may also interact with the following medications:  alcohol  alendronate  bismuth subsalicylate  flavocoxid  herbal supplements like feverfew, garlic, ginger, ginkgo biloba, horse chestnut  medicines for diabetes or glaucoma like acetazolamide, methazolamide  medicines for gout  medicines that treat or prevent blood clots like enoxaparin, heparin, ticlopidine, warfarin  other aspirin and aspirin-like medicines  NSAIDs, medicines for pain and inflammation, like ibuprofen or naproxen  pemetrexed  sulfinpyrazone  varicella live vaccine This list may not describe all possible interactions. Give your health care  provider a list of all the medicines, herbs, non-prescription drugs, or dietary supplements you use. Also tell them if you smoke, drink alcohol, or use illegal drugs. Some items may interact with your medicine. What should I watch for while using this medicine? If you are treating yourself for pain, tell your doctor or health care provider if the pain lasts more than 10 days, if it gets worse, or if there is a new or different kind of pain. Tell your doctor if you see redness or swelling. Also, check with your doctor if you have a fever that lasts for more than 3 days. Only take this medicine to prevent heart attacks or blood clotting if prescribed by your doctor or health care provider. Do not take other medicines that contain aspirin, ibuprofen, or naproxen with this medicine. Side effects such as stomach upset, nausea, or ulcers may be more likely to occur. Many non-prescription medicines contain aspirin, ibuprofen, or naproxen. Always read labels carefully. This medicine can cause serious ulcers and bleeding in the stomach. It can happen with no warning. Smoking, drinking alcohol, older age, and poor health can also increase risks. Call your health care provider right away if you have stomach pain or blood in your vomit or stool. This medicine does not prevent a heart attack or stroke. This medicine may increase the chance of a heart attack or stroke. The chance may increase the longer you use this medicine or if you have heart disease. If you take aspirin to prevent a heart attack or stroke, talk to your health care provider about using this medicine. Alcohol may interfere with the effect of this medicine. Avoid alcoholic drinks. This medicine may cause serious skin reactions. They can happen weeks to months after starting the medicine. Contact your health care provider right away if you notice fevers or flu-like symptoms with a rash. The rash may be red or purple and then turn into blisters or peeling  of the skin. Or, you might notice a red rash with swelling of the face, lips or lymph nodes in your neck or under your arms. Talk to your health care provider if you are pregnant before taking this medicine. Taking this medicine between weeks 20 and 30 of pregnancy may harm your unborn baby. Your health care provider will monitor you closely if you need to take it. After 30 weeks of pregnancy, do not take this medicine. You may get drowsy or dizzy. Do not drive, use machinery, or do anything that needs mental alertness until you know how this medicine affects you. Do not stand up or sit up quickly, especially if you are an older patient. This reduces the risk of dizzy or fainting spells. Be careful brushing or flossing your teeth or using a toothpick because you may get an infection or bleed more easily. If you have any dental work done, tell your dentist you are receiving this medicine. This medicine may make it more difficult to get pregnant. Talk to your health care provider if you are concerned about your fertility. What side effects may I notice from receiving this medicine? Side effects that you should report to your doctor or health care provider as soon as possible:  allergic reactions (skin rash, itching or hives; swelling of the face, lips, or tongue)  bleeding (bloody or  black, tarry stools; red or dark brown urine; spitting up blood or brown material that looks like coffee grounds; red spots on the skin; unusual bruising or bleeding from the eyes, gums, or nose)  blood clot (chest pain; shortness of breath; pain, swelling, or warmth in the leg)  blurred vision OR changes in vision  heart failure (trouble breathing; fast, irregular heartbeat; sudden weight gain; swelling of the ankles, feet, hands; unusually weak or tired)  heart attack (trouble breathing; pain or tightness in the chest, neck, back or arms; unusually weak or tired)  kidney injury (trouble passing urine or change in the  amount of urine)  light-colored stool  liver injury (dark yellow or brown urine; general ill feeling or flu-like symptoms; loss of appetite, right upper belly pain; unusually weak or tired, yellowing of the eyes or skin)  low red blood cell counts (trouble breathing; feeling faint; lightheaded, falls; unusually weak or tired)  rash, fever, and swollen lymph nodes  redness, blistering, peeling, or loosening of the skin, including inside the mouth  stroke (changes in vision; confusion; trouble speaking or understanding; severe headaches; sudden numbness or weakness of the face, arm or leg; trouble walking; dizziness; loss of balance or coordination)  trouble breathing Side effects that usually do not require medical attention (report to your doctor or health care professional if they continue or are bothersome):  constipation  diarrhea  dizziness  headache  nausea, vomiting  passing gas This list may not describe all possible side effects. Call your doctor for medical advice about side effects. You may report side effects to FDA at 1-800-FDA-1088. Where should I keep my medicine? Keep out of the reach of children and pets. Store at room temperature between 15 and 30 degrees C (59 and 86 degrees F). Protect from heat and moisture. Do not use this medicine if it has a strong vinegar smell. Throw away any unused medicine after the expiration date. Get rid of any unused medicine after the expiration date. To get rid of medicines that are no longer needed or have expired:  Take the medicine to a medicine take-back program. Check with your pharmacy or law enforcement to find a location.  If you cannot return the medicine, check the label or package insert to see if the medicine should be thrown out in the garbage or flushed down the toilet. If you are not sure, ask your health care provider. If it is safe to put it in the trash, empty the medicine out of the container. Mix the medicine  with cat litter, dirt, coffee grounds, or other unwanted substance. Seal the mixture in a bag or container. Put it in the trash.

## 2021-03-11 ENCOUNTER — Telehealth: Payer: Self-pay

## 2021-03-11 ENCOUNTER — Encounter: Payer: Self-pay | Admitting: Family Medicine

## 2021-03-11 ENCOUNTER — Other Ambulatory Visit: Payer: Self-pay

## 2021-03-11 ENCOUNTER — Ambulatory Visit (INDEPENDENT_AMBULATORY_CARE_PROVIDER_SITE_OTHER): Payer: 59 | Admitting: Family Medicine

## 2021-03-11 VITALS — BP 122/76 | HR 87 | Temp 98.4°F | Resp 18

## 2021-03-11 DIAGNOSIS — J3089 Other allergic rhinitis: Secondary | ICD-10-CM | POA: Diagnosis not present

## 2021-03-11 DIAGNOSIS — H1010 Acute atopic conjunctivitis, unspecified eye: Secondary | ICD-10-CM

## 2021-03-11 DIAGNOSIS — L509 Urticaria, unspecified: Secondary | ICD-10-CM

## 2021-03-11 DIAGNOSIS — T50905A Adverse effect of unspecified drugs, medicaments and biological substances, initial encounter: Secondary | ICD-10-CM | POA: Insufficient documentation

## 2021-03-11 DIAGNOSIS — J302 Other seasonal allergic rhinitis: Secondary | ICD-10-CM

## 2021-03-11 DIAGNOSIS — H1013 Acute atopic conjunctivitis, bilateral: Secondary | ICD-10-CM

## 2021-03-11 NOTE — Telephone Encounter (Signed)
Patient called stating she had an appointment with Dr. Dimple Casey yesterday who told her to take 1 aspirin 325 mg per day.  Patient states she took the aspirin yesterday and approximately 4 hours later developed a sore scratchy throat.  Patient states she woke up and took her allergy medication and aspirin this morning and approximately 2 hours later started having "what she felt was an allergic reaction."  Patient states she called her allergy doctor and they had her come to the office and did labwork and told to discontinue the aspirin.  Patient requested a return call.

## 2021-03-11 NOTE — Telephone Encounter (Signed)
She was just added to my schedule. I will get tryptase level. Thank you

## 2021-03-11 NOTE — Telephone Encounter (Signed)
Patient has been sensitive to her AIT.  Hard to differentiate which may have been the culprit (injection vs aspirin) as she also received injection yesterday.  I would recommend stopping the aspirin for now - have her call the physician who prescribed it and let them know about this as well.  If her timeline more concerning for the injections - then we may have to back her ait dose further - currently she is frozen at red 0.15. May need to move her back to red 0.1.   If she's coming at 1:30PM - please draw a tryptase level as well.

## 2021-03-11 NOTE — Patient Instructions (Addendum)
Possible drug reaction to aspirin Stop aspirin for now.  Continue to avoid aspirin for now call your rheumatologist and let them know that you need to stop this medication for now  In case of an allergic reaction, take Benadryl 25 mg every 4 hours, and if life-threatening symptoms occur, inject with AuviQ 0.3 mg. We have ordered a lab test to help [place] evaluate if you are having a reaction at this time. We will call you when the result becomes available  Pharyngitis Begin symptomatic treatment including warm beverages, topical throat spray, lozenges (containing benzocaine), increase fluid intake, and humidify the environment if possible.   Allergic rhinitis Continue montelukast 10 mg once a day Continue olopatadine 1 drop in each eye once a day as needed Continue azelastine 1 to 2 sprays in each nostril twice a day as needed for runny nose Continue nasal saline rinses several times a day as needed Continue allergen immunotherapy he never access to an epinephrine autoinjector set  Call the clinic if this treatment plan is not working well for you  Follow up in 2 weeks or sooner if needed.

## 2021-03-11 NOTE — Telephone Encounter (Signed)
Patient called stating that she is having issues with throat clearing. Patient was ordered to take 325 Asprin which she stares last night and she began to have issues with her throat hurting and mucus build up. Patient took her second dose and stated experiencing throat clearing constantly. While on phone with patient she was constantly clearing her throat and stating that she was having to take deep breaths in to breathe somewhat normally. Patient has a history of mediation allergies. Patient came in for her immunotherapy injection yesterday. Patient took her Asprin at 3 pm yesterday and again at 8:20 this morning.  Patient was asked to come in to see Thurston Hole in the [place] office at 1:30 for a same day appointment. Patient expressed that she wasn't sure if she could take off of work to be seen and help with her symptoms.

## 2021-03-11 NOTE — Progress Notes (Signed)
[address] Suzanne Jarvis [place] [address] Dept: [phone]  FOLLOW UP NOTE  Patient ID: Suzanne Jarvis, female    DOB: [birth year]  Age: 30 y.o. MRN: [id number] Date of Office Visit: 03/11/2021  Assessment  Chief Complaint: Allergic Reaction  HPI Suzanne Jarvis is a 30 year old female who presents to the clinic for evaluation of allergic reaction.  She was last seen in this clinic on 01/27/2021 by Dr. Selena Batten for evaluation of allergic rhinitis on allergen immunotherapy, allergic conjunctivitis, and urticaria.  In the interim, she visited Dr. Sheliah Hatch at [facility] health rheumatology for positive ANA and changes in color and feeling in her fingertips and was started on aspirin 325 mg once a day for erythromelalgia.  She also received allergen immunotherapy yesterday directed toward pollens, mold, cat, dog, cockroach, and dust mite 0.15 ml from the 1:100 (red) vial.  She denies any local reaction from her allergen immunotherapy yesterday.  At today's visit, she reports she took the first dose of aspirin yesterday and 4 hours later began to experience throat pain and frequent throat clearing.  She took Benadryl with moderate relief of symptoms at that time.  She reports that she took a second dose of aspirin this morning and noticed an increase in her symptoms including dry cough, throat clearing, and a raw and dry feeling in her throat.  She denies hives, redness, itching on her skin, abdominal pain, diarrhea, and dizziness, shortness of breath, wheeze or throat closing.  She denies fever, sweats, chills, and sick contacts.  She continues montelukast 10 mg once a day, Xyzal 5 mg once a day, Claritin once a day, azelastine as needed, and saline nasal rinses frequently.  She is not currently using Flonase as this contributed to epistaxis.  She has not had an episode of epistaxis since stopping Flonase about 2 months ago.  She reports that she has not taken aspirin before yesterday, however, she does  take ibuprofen as needed with no adverse reaction.  Auvi-Q epinephrine devices are up-to-date.  Her current medications are listed in the chart.   Drug Allergies:  Allergies  Allergen Reactions  . Amlodipine Other (See Comments)    Per patient burning sensation through body  . Amoxicillin Hives  . Hyoscyamine Other (See Comments)  . Mixed Grasses   . Novocain [Procaine] Swelling  . Other      grass, weed, ragweed, trees, cat, mold, dog, dust mites. Borderline to cockroach.    . Paxil [Paroxetine Hcl]     Dizziness   . Penicillins     REACTION: hives    Physical Exam: BP 122/76   Pulse 87   Temp 98.4 F (36.9 C) (Temporal)   Resp 18   SpO2 100%    Physical Exam Vitals reviewed.  Constitutional:      Appearance: Normal appearance.  HENT:     Head: Normocephalic and atraumatic.     Right Ear: Tympanic membrane normal.     Left Ear: Tympanic membrane normal.     Nose:     Comments: Bilateral nares slightly erythematous with clear nasal drainage noted.  Pharynx erythematous with no exudate.  Ears normal.  Eyes normal. Eyes:     Conjunctiva/sclera: Conjunctivae normal.  Cardiovascular:     Rate and Rhythm: Normal rate and regular rhythm.     Heart sounds: Normal heart sounds. No murmur heard.   Pulmonary:     Effort: Pulmonary effort is normal.     Breath sounds: Normal breath sounds.  Comments: Lungs clear to auscultation Musculoskeletal:        General: Normal range of motion.     Cervical back: Normal range of motion.  Skin:    General: Skin is warm and dry.  Neurological:     Mental Status: She is alert and oriented to person, place, and time.  Psychiatric:        Mood and Affect: Mood normal.        Behavior: Behavior normal.        Thought Content: Thought content normal.        Judgment: Judgment normal.     Assessment and Plan: 1. Adverse effect of drug, initial encounter   2. Seasonal and perennial allergic rhinoconjunctivitis   3. Urticaria    4. Allergic conjunctivitis of both eyes     Patient Instructions  Possible drug reaction to aspirin Stop aspirin for now.  Continue to avoid aspirin for now call your rheumatologist and let them know that you need to stop this medication for now  In case of an allergic reaction, take Benadryl 25 mg every 4 hours, and if life-threatening symptoms occur, inject with AuviQ 0.3 mg. We have ordered a lab test to help [place] evaluate if you are having a reaction at this time. We will call you when the result becomes available  Pharyngitis Begin symptomatic treatment including warm beverages, topical throat spray, lozenges (containing benzocaine), increase fluid intake, and humidify the environment if possible.   Allergic rhinitis Continue montelukast 10 mg once a day Continue olopatadine 1 drop in each eye once a day as needed Continue azelastine 1 to 2 sprays in each nostril twice a day as needed for runny nose Continue nasal saline rinses several times a day as needed Continue allergen immunotherapy he never access to an epinephrine autoinjector set  Call the clinic if this treatment plan is not working well for you  Follow up in 2 weeks or sooner if needed.   Return in about 2 weeks (around 03/25/2021), or if symptoms worsen or fail to improve.    Thank you for the opportunity to care for this patient.  Please do not hesitate to contact me with questions.  Thermon Leyland, FNP [facility]

## 2021-03-12 ENCOUNTER — Telehealth: Payer: Self-pay | Admitting: Family Medicine

## 2021-03-12 ENCOUNTER — Telehealth: Payer: Self-pay | Admitting: Orthopaedic Surgery

## 2021-03-12 NOTE — Telephone Encounter (Signed)
Patient reports an increase in mucus drainage and slight decrease in throat pain. She denies symptoms of anaphylaxis. She denies fever, chills, sweats, or sick contacts. She is not interested in going to her PCP for a throat swab or throat culture at this time. She will continue her current treatments directed toward allergic rhinitis at this time. She will call with worsening symptoms or and questions.

## 2021-03-12 NOTE — Telephone Encounter (Signed)
FYI-I spoke with Suzanne Jarvis, sounds likely to be aspirin hypersensitivity she stopped it after 2 days and now doing okay. Recommended we can check in next week if symptoms resolve fully off the medicine and result from allergy clinic test.

## 2021-03-12 NOTE — Telephone Encounter (Signed)
Received vm from [place] w/ Retta Mac. Saying she felt her copy of records was incomplete. IC,lmvm [phone], advised patient only seen once 04/21/20 with Dr. Roda Shutters.

## 2021-03-13 LAB — TRYPTASE: Tryptase: 4.3 ug/L (ref 2.2–13.2)

## 2021-03-13 NOTE — Progress Notes (Signed)
Can you please call this patient and let her know the tryptase level is normal indicating a low likelihood of allergic reaction. Thank you

## 2021-03-14 ENCOUNTER — Other Ambulatory Visit: Payer: Self-pay

## 2021-03-14 ENCOUNTER — Ambulatory Visit
Admission: RE | Admit: 2021-03-14 | Discharge: 2021-03-14 | Disposition: A | Discharge status: Home / Self Care | Payer: 59 | Source: Ambulatory Visit | Attending: Emergency Medicine | Admitting: Emergency Medicine

## 2021-03-14 VITALS — BP 123/85 | HR 86 | Temp 98.3°F | Resp 16

## 2021-03-14 DIAGNOSIS — J0101 Acute recurrent maxillary sinusitis: Secondary | ICD-10-CM

## 2021-03-14 MED ORDER — DOXYCYCLINE HYCLATE 100 MG PO CAPS: 100.0000 mg | ORAL_CAPSULE | Freq: Two times a day (BID) | ORAL | 0 refills | Status: AC

## 2021-03-14 NOTE — ED Triage Notes (Signed)
Pt sts had reaction to ASA on Tuesday that caused her throat to hurt; pt sts still having pain and now also having nasal congestion and cough

## 2021-03-14 NOTE — ED Provider Notes (Signed)
[name]    CSN: [id number] Arrival date & time: 03/14/21  1341      History   Chief Complaint Chief Complaint  Patient presents with  . Sore Throat  . Nasal Congestion  . Appointment    1500    HPI Suzanne Jarvis is a 30 y.o. female presenting today for evaluation of URI symptoms.  Reports associated congestion and sore throat.  Reports earlier in the week took a dose of aspirin and 4 hours later began to develop sore throat, saw allergist afterwards and believed to be potential reaction to aspirin.  Since she has had continued sore throat as well as cough and congestion.  She reports she has history of recurrent sinusitis and feels as if she is at the point where she is needing antibiotics.  Reports she cannot tolerate steroids as this make her "crazy".  HPI  Past Medical History:  Diagnosis Date  . ADD (attention deficit disorder) 07/08/2020  . Angio-edema   . ASCUS with positive high risk HPV cervical 03/26/2019   01/2019; negative colposcopy 03/2019; plan repeat pap and HPV test 01/2020  . Asthma   . Chronic headaches   . Concussion with no loss of consciousness 07/08/2020   May 2021  . Eczema   . IBS (irritable bowel syndrome)   . Penicillin allergy 04/28/2020  . Seasonal and perennial allergic rhinoconjunctivitis 07/28/2020  . Tonsillitis   . Urticaria     Patient Active Problem List   Diagnosis Date Noted  . Drug reaction 03/11/2021  . Positive ANA (antinuclear antibody) 02/24/2021  . History of frequent upper respiratory infection 01/27/2021  . Seasonal allergies 01/14/2021  . Rash and other nonspecific skin eruption 12/30/2020  . Seasonal and perennial allergic rhinoconjunctivitis 07/28/2020  . Concussion with no loss of consciousness 07/08/2020  . ADD (attention deficit disorder) 07/08/2020  . Penicillin allergy 04/28/2020  . IBS (irritable bowel syndrome) 09/30/2015  . Erythema migrans (Lyme disease) 07/15/2015  . Vitamin B 12 deficiency  12/10/2014  . Other allergic rhinitis 02/12/2009    Past Surgical History:  Procedure Laterality Date  . WISDOM TOOTH EXTRACTION      OB History   No obstetric history on file.      Home Medications    Prior to Admission medications   Medication Sig Start Date End Date Taking? Authorizing Provider  doxycycline (VIBRAMYCIN) 100 MG capsule Take 1 capsule (100 mg total) by mouth 2 (two) times daily for 7 days. 03/14/21 03/21/21 Yes Lashawn Orrego C, PA-C  amLODipine (NORVASC) 2.5 MG tablet Take 1 tablet (2.5 mg total) by mouth daily. Patient not taking: Reported on 03/11/2021 01/15/21   Felix PaciniKuneff, [name], DO  amphetamine-dextroamphetamine (ADDERALL) 10 MG tablet Take 1 tablet (10 mg total) by mouth 2 (two) times daily. 01/13/21   [name], [name], DO  ascorbic acid (VITAMIN C) 500 MG tablet Take by mouth.    [provider]  Azelastine HCl 0.15 % SOLN Place 1-2 sprays into the nose 2 (two) times daily as needed (runny nose). 01/27/21   Ellamae SiaKim, Yoon M, DO  Biotin [id number] MCG TABS Take by mouth. 5,000 MCG daily Patient not taking: No sig reported    [provider]  COLLAGEN PO Take by mouth.    [provider]  Cyanocobalamin (B-12) 1000 MCG TABS Take 2 tablets by mouth daily.    [provider]  dicyclomine (BENTYL) 10 MG capsule TAKE 1 CAPSULE BY MOUTH FOUR TIMES DAILY BEFORE MEALS  AND AT BEDTIME PRN 01/13/21   [name], [name], DO  diphenoxylate-atropine (LOMOTIL) 2.5-0.025 MG tablet TAKE 1 TABLET BY MOUTH FOUR TIMES DAILY AS NEEDED FOR DIARRHEA OR LOOSE STOOLS 01/13/21   [name], [name], DO  drospirenone-ethinyl estradiol (YAZ,GIANVI,LORYNA) 3-0.02 MG tablet Take 1 tablet by mouth daily.    [provider]  EPINEPHrine (AUVI-Q) 0.3 mg/0.3 mL IJ SOAJ injection Inject 0.3 mLs (0.3 mg total) into the muscle as needed for anaphylaxis. 04/28/20   Ellamae Sia, DO  fluticasone (FLONASE) 50 MCG/ACT nasal spray Place into both nostrils daily. Patient not taking: No sig  reported    [provider]  levocetirizine (XYZAL) 5 MG tablet TAKE 1 TABLET(5 MG) BY MOUTH EVERY EVENING 01/27/21   Ellamae Sia, DO  Magnesium 100 MG TABS Take by mouth. W/ calcium and zinc    [provider]  montelukast (SINGULAIR) 10 MG tablet Take 1 tablet (10 mg total) by mouth at bedtime. 01/27/21   Ellamae Sia, DO  Multiple Vitamins-Minerals (MULTIVITAMIN WITH MINERALS) tablet Take 1 tablet by mouth daily.    [provider]  nystatin-triamcinolone (MYCOLOG II) cream SMARTSIG:Sparingly Topical 1 to 2 Times Daily PRN 11/10/20   [provider]  olopatadine (PATANOL) 0.1 % ophthalmic solution Place 1 drop into both eyes 2 (two) times daily as needed (itchy/watery eyes). 01/27/21   Ellamae Sia, DO  Vitamin D, Cholecalciferol, 25 MCG (1000 UT) CAPS Take by mouth.    [provider]    Family History Family History  Problem Relation Age of Onset  . Allergies Mother   . Asthma Mother   . Hypertension Mother   . Allergic rhinitis Mother   . Urticaria Mother   . Arthritis Maternal Grandmother   . Irritable bowel syndrome Maternal Grandmother   . Breast cancer Maternal Grandmother   . Allergic rhinitis Sister   . Eczema Sister   . Anemia Sister   . Hashimoto's thyroiditis Brother   . ADD / ADHD Brother   . Lung cancer Other        paternal great grandfather  . Lung cancer Other        maternal great grandfather  . Cancer Other        Blood-great grandmother  . Lupus Cousin   . Hashimoto's thyroiditis Cousin   . Colon cancer Neg Hx     Social History Social History   Tobacco Use  . Smoking status: Former Smoker    Packs/day: 0.25    Years: 6.00    Pack years: 1.50    Types: Cigarettes    Quit date: 2018    Years since quitting: 4.3  . Smokeless tobacco: Never Used  Vaping Use  . Vaping Use: Some days  Substance Use Topics  . Alcohol use: Yes    Comment: Rarely  . Drug use: No     Allergies   Amlodipine, Amoxicillin,  Aspirin, Hyoscyamine, Mixed grasses, Novocain [procaine], Other, Paxil [paroxetine hcl], and Penicillins   Review of Systems Review of Systems  Constitutional: Negative for activity change, appetite change, chills, fatigue and fever.  HENT: Positive for congestion, rhinorrhea and sore throat. Negative for ear pain, sinus pressure and trouble swallowing.   Eyes: Negative for discharge and redness.  Respiratory: Positive for cough. Negative for chest tightness and shortness of breath.   Cardiovascular: Negative for chest pain.  Gastrointestinal: Negative for abdominal pain, diarrhea, nausea and vomiting.  Musculoskeletal: Negative for myalgias.  Skin: Negative for  rash.  Neurological: Negative for dizziness, light-headedness and headaches.     Physical Exam Triage Vital Signs ED Triage Vitals  Enc Vitals Group     BP      Pulse      Resp      Temp      Temp src      SpO2      Weight      Height      Head Circumference      Peak Flow      Pain Score      Pain Loc      Pain Edu?      Excl. in GC?    No data found.  Updated Vital Signs BP 123/85 (BP Location: Left Arm)   Pulse 86   Temp 98.3 F (36.8 C) (Oral)   Resp 16   SpO2 98%   Visual Acuity Right Eye Distance:   Left Eye Distance:   Bilateral Distance:    Right Eye Near:   Left Eye Near:    Bilateral Near:     Physical Exam Vitals and nursing note reviewed.  Constitutional:      Appearance: She is well-developed.     Comments: No acute distress  HENT:     Head: Normocephalic and atraumatic.     Ears:     Comments: Bilateral ears without tenderness to palpation of external auricle, tragus and mastoid, EAC's without erythema or swelling, TM's with good bony landmarks and cone of light. Non erythematous. Bilateral clear effusions    Nose: Nose normal.     Mouth/Throat:     Comments: Oral mucosa pink and moist, no tonsillar enlargement or exudate. Posterior pharynx patent and nonerythematous, no uvula  deviation or swelling. Normal phonation. Eyes:     Conjunctiva/sclera: Conjunctivae normal.  Cardiovascular:     Rate and Rhythm: Normal rate.  Pulmonary:     Effort: Pulmonary effort is normal. No respiratory distress.     Comments: Breathing comfortably at rest, CTABL, no wheezing, rales or other adventitious sounds auscultated Abdominal:     General: There is no distension.  Musculoskeletal:        General: Normal range of motion.     Cervical back: Neck supple.  Skin:    General: Skin is warm and dry.  Neurological:     Mental Status: She is alert and oriented to person, place, and time.      UC Treatments / Results  Labs (all labs ordered are listed, but only abnormal results are displayed) Labs Reviewed - No data to display  EKG   Radiology No results found.  Procedures Procedures (including critical care time)  Medications Ordered in UC Medications - No data to display  Initial Impression / Assessment and Plan / UC Course  I have reviewed the triage vital signs and the nursing notes.  Pertinent labs & imaging results that were available during my care of the patient were reviewed by me and considered in my medical decision making (see chart for details).     Sinus symptoms x5 to 6 days, continue allergy medicines which she is already on, given patient's recurrent history did opt to go ahead and proceed with doxycycline, but discussed given symptoms only x5 to 6 days may need an additional 3 to 4 days for symptoms to begin to improve.  Continue symptomatic and supportive care, rest and fluids.  Deferring any steroids at this time.  Discussed strict return precautions. Patient verbalized  understanding and is agreeable with plan.  Final Clinical Impressions(s) / UC Diagnoses   Final diagnoses:  Acute recurrent maxillary sinusitis     Discharge Instructions     Continue allergy meds Add doxy Follow up as needed    ED Prescriptions    Medication Sig  Dispense Auth. Provider   doxycycline (VIBRAMYCIN) 100 MG capsule Take 1 capsule (100 mg total) by mouth 2 (two) times daily for 7 days. 14 capsule Efraim Vanallen, Ballinger C, PA-C     PDMP not reviewed this encounter.   Lew Dawes, PA-C 03/14/21 1441

## 2021-03-14 NOTE — Discharge Instructions (Signed)
Continue allergy meds Add doxy Follow up as needed

## 2021-03-17 ENCOUNTER — Ambulatory Visit (INDEPENDENT_AMBULATORY_CARE_PROVIDER_SITE_OTHER): Payer: 59 | Admitting: Psychology

## 2021-03-17 DIAGNOSIS — F411 Generalized anxiety disorder: Secondary | ICD-10-CM | POA: Diagnosis not present

## 2021-03-17 DIAGNOSIS — J3089 Other allergic rhinitis: Secondary | ICD-10-CM | POA: Diagnosis not present

## 2021-03-17 NOTE — Progress Notes (Signed)
VIALS EXP 03-17-22 

## 2021-03-24 ENCOUNTER — Ambulatory Visit: Payer: Self-pay

## 2021-03-24 ENCOUNTER — Encounter: Payer: Self-pay | Admitting: Family Medicine

## 2021-03-24 ENCOUNTER — Other Ambulatory Visit: Payer: Self-pay

## 2021-03-24 ENCOUNTER — Ambulatory Visit (INDEPENDENT_AMBULATORY_CARE_PROVIDER_SITE_OTHER): Payer: 59 | Admitting: Family Medicine

## 2021-03-24 VITALS — BP 124/82 | HR 91 | Temp 98.1°F | Resp 16 | Ht 65.0 in | Wt 122.6 lb

## 2021-03-24 DIAGNOSIS — H1013 Acute atopic conjunctivitis, bilateral: Secondary | ICD-10-CM | POA: Diagnosis not present

## 2021-03-24 DIAGNOSIS — H1010 Acute atopic conjunctivitis, unspecified eye: Secondary | ICD-10-CM

## 2021-03-24 DIAGNOSIS — J309 Allergic rhinitis, unspecified: Secondary | ICD-10-CM

## 2021-03-24 DIAGNOSIS — J3089 Other allergic rhinitis: Secondary | ICD-10-CM | POA: Diagnosis not present

## 2021-03-24 DIAGNOSIS — J302 Other seasonal allergic rhinitis: Secondary | ICD-10-CM

## 2021-03-24 DIAGNOSIS — T50905D Adverse effect of unspecified drugs, medicaments and biological substances, subsequent encounter: Secondary | ICD-10-CM

## 2021-03-24 DIAGNOSIS — L509 Urticaria, unspecified: Secondary | ICD-10-CM | POA: Diagnosis not present

## 2021-03-24 MED ORDER — OLOPATADINE HCL 0.1 % OP SOLN: 1.0000 [drp] | Freq: Two times a day (BID) | OPHTHALMIC | 5 refills | Status: DC | PRN

## 2021-03-24 MED ORDER — AZELASTINE HCL 0.15 % NA SOLN: NASAL | 5 refills | Status: DC

## 2021-03-24 MED ORDER — MONTELUKAST SODIUM 10 MG PO TABS: 10.0000 mg | ORAL_TABLET | Freq: Every day | ORAL | 1 refills | Status: DC

## 2021-03-24 NOTE — Patient Instructions (Addendum)
Possible drug reaction to aspirin Continue to avoid aspirin for now.    In case of an allergic reaction, take Benadryl 25 mg every 4 hours, and if life-threatening symptoms occur, inject with AuviQ 0.3 mg. If you should become interested in an aspirin challenge,call the clinic for an appointment for a challenge to aspirin. Remember to stop antihistamines for 3 days before the challenge appointment Continue to follow-up with your rheumatologist regarding treatment for erythromelalgia  Allergic rhinitis/allergic conjunctivitis Continue allergen avoidance directed toward tree pollen, grass pollen, weed pollen, ragweed pollen, cat, dog, mold, dust mites, and cockroach as listed below Continue an over-the-counter antihistamine once or twice a day depending on your symptoms. Remember to rotate to a different antihistamine about every 3 months. Some examples of over the counter antihistamines include Zyrtec (cetirizine), Xyzal (levocetirizine), Allegra (fexofenadine), and Claritin (loratidine).  Continue montelukast 10 mg once a day Continue olopatadine 1 drop in each eye once a day as needed Continue azelastine 1 to 2 sprays in each nostril twice a day as needed for runny nose Continue nasal saline rinses several times a day as needed Continue allergen immunotherapy and have access to an epinephrine autoinjector set  Call the clinic if this treatment plan is not working well for you  Follow up in 6 motnhs or sooner if needed.  Reducing Pollen Exposure The American Academy of Allergy, Asthma and Immunology suggests the following steps to reduce your exposure to pollen during allergy seasons. 1. Do not hang sheets or clothing out to dry; pollen may collect on these items. 2. Do not mow lawns or spend time around freshly cut grass; mowing stirs up pollen. 3. Keep windows closed at night.  Keep car windows closed while driving. 4. Minimize morning activities outdoors, a time when pollen counts are  usually at their highest. 5. Stay indoors as much as possible when pollen counts or humidity is high and on windy days when pollen tends to remain in the air longer. 6. Use air conditioning when possible.  Many air conditioners have filters that trap the pollen spores. 7. Use a HEPA room air filter to remove pollen form the indoor air you breathe.  Control of Dog or Cat Allergen Avoidance is the best way to manage a dog or cat allergy. If you have a dog or cat and are allergic to dog or cats, consider removing the dog or cat from the home. If you have a dog or cat but don't want to find it a new home, or if your family wants a pet even though someone in the household is allergic, here are some strategies that may help keep symptoms at bay:  8. Keep the pet out of your bedroom and restrict it to only a few rooms. Be advised that keeping the dog or cat in only one room will not limit the allergens to that room. 9. Don't pet, hug or kiss the dog or cat; if you do, wash your hands with soap and water. 10. High-efficiency particulate air (HEPA) cleaners run continuously in a bedroom or living room can reduce allergen levels over time. 11. Regular use of a high-efficiency vacuum cleaner or a central vacuum can reduce allergen levels. 12. Giving your dog or cat a bath at least once a week can reduce airborne allergen.  Control of Mold Allergen Mold and fungi can grow on a variety of surfaces provided certain temperature and moisture conditions exist.  Outdoor molds grow on plants, decaying vegetation and soil.  The major outdoor mold, Alternaria and Cladosporium, are found in very high numbers during hot and dry conditions.  Generally, a late Summer - Fall peak is seen for common outdoor fungal spores.  Rain will temporarily lower outdoor mold spore count, but counts rise rapidly when the rainy period ends.  The most important indoor molds are Aspergillus and Penicillium.  Dark, humid and poorly ventilated  basements are ideal sites for mold growth.  The next most common sites of mold growth are the bathroom and the kitchen.  Outdoor [employer] 13. Use air conditioning and keep windows closed 14. Avoid exposure to decaying vegetation. 15. Avoid leaf raking. 16. Avoid grain handling. 17. Consider wearing a face mask if working in moldy areas.  Indoor Mold Control 1. Maintain humidity below 50%. 2. Clean washable surfaces with 5% bleach solution. 3. Remove sources e.g. Contaminated carpets.   Control of Dust Mite Allergen Dust mites play a major role in allergic asthma and rhinitis. They occur in environments with high humidity wherever human skin is found. Dust mites absorb humidity from the atmosphere (ie, they do not drink) and feed on organic matter (including shed human and animal skin). Dust mites are a microscopic type of insect that you cannot see with the naked eye. High levels of dust mites have been detected from mattresses, pillows, carpets, upholstered furniture, bed covers, clothes, soft toys and any woven material. The principal allergen of the dust mite is found in its feces. A gram of dust may contain 1,000 mites and 250,000 fecal particles. Mite antigen is easily measured in the air during house cleaning activities. Dust mites do not bite and do not cause harm to humans, other than by triggering allergies/asthma.  Ways to decrease your exposure to dust mites in your home:  1. Encase mattresses, box springs and pillows with a mite-impermeable barrier or cover  2. Wash sheets, blankets and drapes weekly in hot water (130 F) with detergent and dry them in a dryer on the hot setting.  3. Have the room cleaned frequently with a vacuum cleaner and a damp dust-mop. For carpeting or rugs, vacuuming with a vacuum cleaner equipped with a high-efficiency particulate air (HEPA) filter. The dust mite allergic individual should not be in a room which is being cleaned and should wait 1 hour  after cleaning before going into the room.  4. Do not sleep on upholstered furniture (eg, couches).  5. If possible removing carpeting, upholstered furniture and drapery from the home is ideal. Horizontal blinds should be eliminated in the rooms where the person spends the most time (bedroom, study, television room). Washable vinyl, roller-type shades are optimal.  6. Remove all non-washable stuffed toys from the bedroom. Wash stuffed toys weekly like sheets and blankets above.  7. Reduce indoor humidity to less than 50%. Inexpensive humidity monitors can be purchased at most hardware stores. Do not use a humidifier as can make the problem worse and are not recommended.  Control of Cockroach Allergen  Cockroach allergen has been identified as an important cause of acute attacks of asthma, especially in urban settings.  There are fifty-five species of cockroach that exist in the [place], however only three, the [place], [place] species produce allergen that can affect patients with Asthma.  Allergens can be obtained from fecal particles, egg casings and secretions from cockroaches.    1. Remove food sources. 2. Reduce access to water. 3. Seal access and entry points. 4. Spray runways with 0.5-1%  Diazinon or Chlorpyrifos 5. Blow boric acid power under stoves and refrigerator. 6. Place bait stations (hydramethylnon) at feeding sites.

## 2021-03-24 NOTE — Progress Notes (Signed)
[address] [place]o [place] [address] Dept: [phone]  FOLLOW UP NOTE  Patient ID: Suzanne Jarvis, female    DOB: 01/26/1991  Age: 30 y.o. MRN: [id number] Date of Office Visit: 03/24/2021  Assessment  Chief Complaint: Allergic Rhinitis  (She was on antibiotic for a sinus infection. Says she is still doing immunotherapy. )  HPI Suzanne Jarvis is a 29 year old female who presents to the clinic for follow-up visit.  She was last seen in this clinic on 02/20/2021 for evaluation of possible reaction to aspirin, allergic rhinitis, allergic conjunctivitis, and urticaria.  Prior to that visit, she had seen Dr. Sheliah Hatch at [facility] rheumatology for positive ANA and started on aspirin 325 mg once a day for erythromelalgia.  At her last visit, she reported sore throat and frequent throat clearing 4 hours after taking her first dose of aspirin which worsened after the second dose.The day before this reaction she received allergen immunotherapy directed toward pollens, mold, cat, dog, cockroach, and dust mite 0.15 mL from the 1:100 vial.  At her last visit, her tryptase was within normal limits and she did not use her epinephrine autoinjector.   In the interim, she visited urgent care on 03/14/2021 and received doxycycline for symptoms including thick mucus and sinus pain.  At today's visit, she reports her cough has improved, however, she still is experiencing some postnasal drainage and clear rhinorrhea.  She continues montelukast 10 mg once a day, Xyzal 5 mg once a day, Claritin 10 mg once a day, azelastine as needed, and saline nasal rinses frequently.  She is not currently using Flonase as she reports this contributes to epistaxis.  She denies epistaxis since stopping Flonase about 2-1/2 months ago.  She continues allergen immunotherapy directed toward pollens, mold, cat, dog, cockroach, and dust mite.  She reports a significant decrease in her symptoms of allergic rhinitis while continuing  allergen immunotherapy. She reports that she is able to take ibuprofen, ibuprofen compounds, and Tylenol with no adverse reaction.  She reports that she is not interested in an aspirin challenge at this time, however, he may be open to this option in the future.  Her current medications are listed in the chart.     Drug Allergies:  Allergies  Allergen Reactions  . Amlodipine Other (See Comments)    Per patient burning sensation through body  . Amoxicillin Hives  . Aspirin   . Hyoscyamine Other (See Comments)  . Mixed Grasses   . Novocain [Procaine] Swelling  . Other      grass, weed, ragweed, trees, cat, mold, dog, dust mites. Borderline to cockroach.    . Paxil [Paroxetine Hcl]     Dizziness   . Penicillins     REACTION: hives    Physical Exam: BP 124/82   Pulse 91   Temp 98.1 F (36.7 C)   Resp 16   Ht 5\' 5"  (1.651 m)   Wt 122 lb 9.6 oz (55.6 kg)   SpO2 98%   BMI 20.40 kg/m    Physical Exam Vitals reviewed.  Constitutional:      Appearance: Normal appearance.  HENT:     Head: Normocephalic and atraumatic.     Right Ear: Tympanic membrane normal.     Left Ear: Tympanic membrane normal.     Nose:     Comments: Bilateral nares slightly erythematous with clear nasal drainage noted.  Pharynx with cobblestone appearance.  Ears normal.  Eyes normal. Eyes:  Conjunctiva/sclera: Conjunctivae normal.  Cardiovascular:     Rate and Rhythm: Normal rate and regular rhythm.     Heart sounds: Normal heart sounds. No murmur heard.   Pulmonary:     Effort: Pulmonary effort is normal.     Breath sounds: Normal breath sounds.     Comments: Lungs clear to auscultation Musculoskeletal:        General: Normal range of motion.     Cervical back: Normal range of motion and neck supple.  Skin:    General: Skin is warm and dry.  Neurological:     Mental Status: She is alert and oriented to person, place, and time.  Psychiatric:        Mood and Affect: Mood normal.         Behavior: Behavior normal.        Thought Content: Thought content normal.        Judgment: Judgment normal.     Assessment and Plan: 1. Adverse effect of drug, subsequent encounter   2. Seasonal and perennial allergic rhinoconjunctivitis   3. Urticaria     Meds ordered this encounter  Medications  . olopatadine (PATANOL) 0.1 % ophthalmic solution    Sig: Place 1 drop into both eyes 2 (two) times daily as needed (itchy/watery eyes).    Dispense:  5 mL    Refill:  5  . montelukast (SINGULAIR) 10 MG tablet    Sig: Take 1 tablet (10 mg total) by mouth at bedtime.    Dispense:  90 tablet    Refill:  1  . Azelastine HCl 0.15 % SOLN    Sig: 1-2 sprays 2 times daily as needed for runny nose.    Dispense:  30 mL    Refill:  5    Patient Instructions  Possible drug reaction to aspirin Continue to avoid aspirin for now call your rheumatologist and let them know that you need to stop this medication for now  In case of an allergic reaction, take Benadryl 25 mg every 4 hours, and if life-threatening symptoms occur, inject with AuviQ 0.3 mg. If you should become interested in an aspirin challenge,call the clinic for an appointment for a challenge to aspirin. Remember to stop antihistamines for 3 days before the challenge appointment Continue to follow-up with your rheumatologist regarding treatment for erythromelalgia  Allergic rhinitis/allergic conjunctivitis Continue allergen avoidance directed toward tree pollen, grass pollen, weed pollen, ragweed pollen, cat, dog, mold, dust mites, and cockroach as listed below Continue montelukast 10 mg once a day Continue olopatadine 1 drop in each eye once a day as needed Continue azelastine 1 to 2 sprays in each nostril twice a day as needed for runny nose Continue nasal saline rinses several times a day as needed Continue allergen immunotherapy and have access to an epinephrine autoinjector set  Call the clinic if this treatment plan is not  working well for you  Follow up in 6 motnhs or sooner if needed.   Return in about 6 months (around 09/24/2021), or if symptoms worsen or fail to improve.    Thank you for the opportunity to care for this patient.  Please do not hesitate to contact me with questions.  Thermon Leyland, FNP [facility]

## 2021-03-30 ENCOUNTER — Ambulatory Visit (INDEPENDENT_AMBULATORY_CARE_PROVIDER_SITE_OTHER): Payer: 59 | Admitting: *Deleted

## 2021-03-30 DIAGNOSIS — J309 Allergic rhinitis, unspecified: Secondary | ICD-10-CM | POA: Diagnosis not present

## 2021-03-31 ENCOUNTER — Ambulatory Visit (INDEPENDENT_AMBULATORY_CARE_PROVIDER_SITE_OTHER): Payer: 59 | Admitting: Psychology

## 2021-03-31 DIAGNOSIS — F411 Generalized anxiety disorder: Secondary | ICD-10-CM | POA: Diagnosis not present

## 2021-04-06 ENCOUNTER — Ambulatory Visit (INDEPENDENT_AMBULATORY_CARE_PROVIDER_SITE_OTHER): Payer: 59 | Admitting: *Deleted

## 2021-04-06 DIAGNOSIS — J309 Allergic rhinitis, unspecified: Secondary | ICD-10-CM

## 2021-04-14 ENCOUNTER — Ambulatory Visit: Payer: 59 | Admitting: Psychology

## 2021-04-14 ENCOUNTER — Ambulatory Visit: Payer: 59 | Admitting: Internal Medicine

## 2021-04-14 ENCOUNTER — Encounter: Payer: Self-pay | Admitting: Family Medicine

## 2021-04-14 ENCOUNTER — Ambulatory Visit (INDEPENDENT_AMBULATORY_CARE_PROVIDER_SITE_OTHER): Payer: 59 | Admitting: Family Medicine

## 2021-04-14 ENCOUNTER — Ambulatory Visit (INDEPENDENT_AMBULATORY_CARE_PROVIDER_SITE_OTHER): Payer: 59

## 2021-04-14 ENCOUNTER — Encounter: Payer: Self-pay | Admitting: Internal Medicine

## 2021-04-14 ENCOUNTER — Other Ambulatory Visit: Payer: Self-pay

## 2021-04-14 VITALS — BP 136/86 | HR 94 | Temp 99.1°F | Ht 65.0 in | Wt 127.0 lb

## 2021-04-14 VITALS — BP 130/87 | HR 92 | Ht 65.0 in | Wt 128.0 lb

## 2021-04-14 DIAGNOSIS — S060X0D Concussion without loss of consciousness, subsequent encounter: Secondary | ICD-10-CM

## 2021-04-14 DIAGNOSIS — K580 Irritable bowel syndrome with diarrhea: Secondary | ICD-10-CM

## 2021-04-14 DIAGNOSIS — R768 Other specified abnormal immunological findings in serum: Secondary | ICD-10-CM | POA: Diagnosis not present

## 2021-04-14 DIAGNOSIS — R21 Rash and other nonspecific skin eruption: Secondary | ICD-10-CM | POA: Diagnosis not present

## 2021-04-14 DIAGNOSIS — J309 Allergic rhinitis, unspecified: Secondary | ICD-10-CM

## 2021-04-14 DIAGNOSIS — R200 Anesthesia of skin: Secondary | ICD-10-CM | POA: Diagnosis not present

## 2021-04-14 DIAGNOSIS — F988 Other specified behavioral and emotional disorders with onset usually occurring in childhood and adolescence: Secondary | ICD-10-CM

## 2021-04-14 HISTORY — DX: Anesthesia of skin: R20.0

## 2021-04-14 MED ORDER — AMPHETAMINE-DEXTROAMPHETAMINE 10 MG PO TABS: 10.0000 mg | ORAL_TABLET | Freq: Two times a day (BID) | ORAL | 0 refills | Status: DC

## 2021-04-14 NOTE — Patient Instructions (Signed)
Great to see you today.   I have refilled your meds for 6 months.

## 2021-04-14 NOTE — Progress Notes (Signed)
Office Visit Note  Patient: Suzanne Jarvis             Date of Birth: February 10, 1991           MRN: [id number]             PCP: Natalia Leatherwood, DO Referring: Natalia Leatherwood, DO Visit Date: 04/14/2021   Subjective:  Follow-up (Patient complains of left leg twitching for 2 days straight and right lower leg pain/cramping (2 weeks ago). Patient had an episode on 03/18/2021 of right hand redness and discoloration. )   History of Present Illness: Suzanne Jarvis is a 30 y.o. female here for follow up for ongoing symptoms of suspected erythromelalgia with peripheral hand and feet episodes of redness swelling with pain and paresthesias.  She started taking high-dose aspirin after last visit but developed significant symptoms so follow-up with allergy clinic was suggestive against a true hypersensitivity reaction.  Since her last visit she has noticed ongoing symptoms sometimes daily with episodic red burning painful discoloration in her hands when severe and limiting her ability to drive.  She is also noticed some episodes of left leg twitching and of right leg distal cramping that occurs spontaneously.  She had 1 more severe episode with her right hand developed severe redness of the first through third digits and pallor in the fourth and fifth digit she never previously noticed both discoloration juxtaposed in previous episodes.   Review of Systems  Constitutional:  Positive for fatigue.  HENT:  Positive for nose dryness. Negative for mouth sores and mouth dryness.   Eyes:  Positive for itching and visual disturbance. Negative for pain and dryness.  Respiratory:  Positive for cough. Negative for hemoptysis, shortness of breath and difficulty breathing.   Cardiovascular:  Negative for chest pain, palpitations and swelling in legs/feet.  Gastrointestinal:  Positive for constipation and diarrhea. Negative for abdominal pain and blood in stool.  Endocrine: Negative for increased urination.   Genitourinary:  Negative for painful urination.  Musculoskeletal:  Positive for myalgias, muscle tenderness and myalgias. Negative for joint pain, joint pain, joint swelling, muscle weakness and morning stiffness.  Skin:  Positive for rash. Negative for color change and redness.  Allergic/Immunologic: Positive for susceptible to infections.  Neurological:  Positive for dizziness, headaches and memory loss. Negative for numbness and weakness.  Hematological:  Negative for swollen glands.  Psychiatric/Behavioral:  Positive for sleep disturbance. Negative for confusion.    PMFS History:  Patient Active Problem List   Diagnosis Date Noted   Bilateral hand numbness 04/14/2021   Drug reaction 03/11/2021   Positive ANA (antinuclear antibody) 02/24/2021   History of frequent upper respiratory infection 01/27/2021   Seasonal allergies 01/14/2021   Rash and other nonspecific skin eruption 12/30/2020   Seasonal and perennial allergic rhinoconjunctivitis 07/28/2020   Concussion with no loss of consciousness 07/08/2020   ADD (attention deficit disorder) 07/08/2020   Penicillin allergy 04/28/2020   IBS (irritable bowel syndrome) 09/30/2015   Vitamin B 12 deficiency 12/10/2014    Past Medical History:  Diagnosis Date   ADD (attention deficit disorder) 07/08/2020   Angio-edema    ASCUS with positive high risk HPV cervical 03/26/2019   01/2019; negative colposcopy 03/2019; plan repeat pap and HPV test 01/2020   Asthma    Chronic headaches    Concussion with no loss of consciousness 07/08/2020   May 2021   Eczema    IBS (irritable bowel syndrome)    Penicillin allergy 04/28/2020  Seasonal and perennial allergic rhinoconjunctivitis 07/28/2020   Tonsillitis    Urticaria     Family History  Problem Relation Age of Onset   Allergies Mother    Asthma Mother    Hypertension Mother    Allergic rhinitis Mother    Urticaria Mother    Arthritis Maternal Grandmother    Irritable bowel syndrome  Maternal Grandmother    Breast cancer Maternal Grandmother    Allergic rhinitis Sister    Eczema Sister    Anemia Sister    Hashimoto's thyroiditis Brother    ADD / ADHD Brother    Lung cancer Other        paternal great grandfather   Lung cancer Other        maternal great grandfather   Cancer Other        Blood-great grandmother   Lupus Cousin    Hashimoto's thyroiditis Cousin    Colon cancer Neg Hx    Past Surgical History:  Procedure Laterality Date   WISDOM TOOTH EXTRACTION     Social History   Social History Narrative   Not on file   Immunization History  Administered Date(s) Administered   Influenza Split 11/18/2011, 11/26/2012   Influenza,inj,Quad PF,6+ Mos 08/08/2013, 09/05/2014, 08/14/2015, 08/24/2016, 08/26/2017, 08/29/2018, 09/18/2019, 09/09/2020   PFIZER(Purple Top)SARS-COV-2 Vaccination 03/31/2020, 04/21/2020, 11/10/2020   Td 06/16/2008   Tdap 01/29/2020     Objective: Vital Signs: BP 130/87 (BP Location: Right Arm, Patient Position: Sitting, Cuff Size: Normal)   Pulse 92   Ht 5\' 5"  (1.651 m)   Wt 128 lb (58.1 kg)   BMI 21.30 kg/m    Physical Exam HENT:     Right Ear: External ear normal.     Left Ear: External ear normal.  Skin:    General: Skin is warm and dry.     Findings: No rash.  Neurological:     General: No focal deficit present.     Mental Status: She is alert.  Psychiatric:        Mood and Affect: Mood normal.    Musculoskeletal Exam:  Elbows full ROM no tenderness or swelling Wrists full ROM no tenderness or swelling Fingers full ROM no tenderness or swelling Knees full ROM no tenderness or swelling Ankles full ROM no tenderness or swelling   Investigation: No additional findings.  Imaging: No results found.  Recent Labs: Lab Results  Component Value Date   WBC 4.8 01/13/2021   HGB 14.3 01/13/2021   PLT 184.0 01/13/2021   NA 136 01/13/2021   K 3.7 01/13/2021   CL 102 01/13/2021   CO2 26 01/13/2021   GLUCOSE 85  01/13/2021   BUN 9 01/13/2021   CREATININE 0.76 01/13/2021   BILITOT 0.5 01/13/2021   ALKPHOS 39 01/13/2021   AST 17 01/13/2021   ALT 13 01/13/2021   PROT 7.4 02/24/2021   ALBUMIN 4.2 01/13/2021   CALCIUM 9.5 01/13/2021   GFRAA >60 09/24/2018    Speciality Comments: No specialty comments available.  Procedures:  No procedures performed Allergies: Aspirin, Amlodipine, Amoxicillin, Hyoscyamine, Mixed grasses, Novocain [procaine], Other, Paxil [paroxetine hcl], and Penicillins   Assessment / Plan:     Visit Diagnoses: Positive ANA (antinuclear antibody) Rash and other nonspecific skin eruption - Plan: Ambulatory referral to Physical Medicine Rehab Bilateral hand numbness  Continue suspect erythromelalgia symptoms remain atypical for Raynaud's phenomenon but no evidence of digital ischemia or other injury.  She did not tolerate oral aspirin as treatment also previously did not  tolerate calcium channel blocker treatment.  Discussed options for next step considering positive ANA but otherwise normal labs and the symptoms consider trial of SNRI medication, hydroxychloroquine as a immunosuppression medication, or additional diagnostic work-up since she has had such poor outcomes with empiric treatment so far.  Nerve conduction study is often abnormal in erythromelalgia so we will refer for this and plan to follow-up.  Orders: Orders Placed This Encounter  Procedures   Ambulatory referral to Physical Medicine Rehab   No orders of the defined types were placed in this encounter.    Follow-Up Instructions: No follow-ups on file.   Fuller Plan, MD  Note - This record has been created using [employer].  Chart creation errors have been sought, but may not always  have been located. Such creation errors do not reflect on  the standard of medical care.

## 2021-04-14 NOTE — Progress Notes (Signed)
This visit occurred during the SARS-CoV-2 public health emergency.  Safety protocols were in place, including screening questions prior to the visit, additional usage of staff PPE, and extensive cleaning of exam room while observing appropriate contact time as indicated for disinfecting solutions.    [name] , [birth year], 30 y.o., female MRN: [id number] Patient Care Team    Relationship Specialty Notifications Start End  Natalia Leatherwood, DO PCP - General Family Medicine  09/04/15   Rodell Perna, NP (Inactive) Nurse Practitioner Obstetrics and Gynecology  01/29/20     Chief Complaint  Patient presents with  . ADHD    Pt is not fasting      Subjective:Suzanne Jarvis is a 30 y.o.  pt present for an OV for chronic medical conditions  Attention deficit:  pt present to follow-up with her attention deficit status post concussion.  Patient reports  Adderall to 10 mg twice daily seems to be the perfect fit for her. At this dose she is able to remain productive s/p concussion.  Prior note: Status post concussion and sequela of insomnia and focus deficits.  She has been evaluated and followed by sports medicine for this condition.  Her condition is felt to be stable and is now referred back to primary care to take over management.  Patient reports she is sleeping well on trazodone 50 mg nightly.  She also feels her focus is much improved on Adderall 20 mg XR.  She is uncertain if she needs this high of a dose.  She also would like to hopefully not have to stay on trazodone or Adderall long-term.  She does admit they are both helpful with her condition.    Irritable bowel syndrome with diarrhea Patient reports her irritable bowel syndrome is very well  controlled.  She is using the Lomotil and Bentyl as needed.   Depression screen [facility] 2 2/9 10/14/2020 01/29/2020 11/20/2019  Decreased Interest 0 0 0  Down, Depressed, Hopeless 0 0 0  PHQ - 2 Score 0 0 0    Allergies  Allergen  Reactions  . Aspirin Anaphylaxis  . Amlodipine Other (See Comments)    Per patient burning sensation through body  . Amoxicillin Hives  . Hyoscyamine Other (See Comments)  . Mixed Grasses   . Novocain [Procaine] Swelling  . Other      grass, weed, ragweed, trees, cat, mold, dog, dust mites. Borderline to cockroach.    . Paxil [Paroxetine Hcl]     Dizziness   . Penicillins     REACTION: hives   Social History   Social History Narrative  . Not on file   Past Medical History:  Diagnosis Date  . ADD (attention deficit disorder) 07/08/2020  . Angio-edema   . ASCUS with positive high risk HPV cervical 03/26/2019   01/2019; negative colposcopy 03/2019; plan repeat pap and HPV test 01/2020  . Asthma   . Chronic headaches   . Concussion with no loss of consciousness 07/08/2020   May 2021  . Eczema   . IBS (irritable bowel syndrome)   . Penicillin allergy 04/28/2020  . Seasonal and perennial allergic rhinoconjunctivitis 07/28/2020  . Tonsillitis   . Urticaria    Past Surgical History:  Procedure Laterality Date  . WISDOM TOOTH EXTRACTION     Family History  Problem Relation Age of Onset  . Allergies Mother   . Asthma Mother   . Hypertension Mother   . Allergic rhinitis Mother   .  Urticaria Mother   . Arthritis Maternal Grandmother   . Irritable bowel syndrome Maternal Grandmother   . Breast cancer Maternal Grandmother   . Allergic rhinitis Sister   . Eczema Sister   . Anemia Sister   . Hashimoto's thyroiditis Brother   . ADD / ADHD Brother   . Lung cancer Other        paternal great grandfather  . Lung cancer Other        maternal great grandfather  . Cancer Other        Blood-great grandmother  . Lupus Cousin   . Hashimoto's thyroiditis Cousin   . Colon cancer Neg Hx    Allergies as of 04/14/2021      Reactions   Aspirin Anaphylaxis   Amlodipine Other (See Comments)   Per patient burning sensation through body   Amoxicillin Hives   Hyoscyamine Other (See  Comments)   Mixed Grasses    Novocain [procaine] Swelling   Other     grass, weed, ragweed, trees, cat, mold, dog, dust mites. Borderline to cockroach.     Paxil [paroxetine Hcl]    Dizziness   Penicillins    REACTION: hives      Medication List       Accurate as of April 14, 2021  9:27 AM. If you have any questions, ask your nurse or doctor.        STOP taking these medications   amLODipine 2.5 MG tablet Commonly known as: NORVASC Stopped by: Felix Pacini, DO     TAKE these medications   amphetamine-dextroamphetamine 10 MG tablet Commonly known as: Adderall Take 1 tablet (10 mg total) by mouth 2 (two) times daily.   ascorbic acid 500 MG tablet Commonly known as: VITAMIN C Take by mouth.   Azelastine HCl 0.15 % Soln Place 1-2 sprays into the nose 2 (two) times daily as needed (runny nose). What changed: Another medication with the same name was removed. Continue taking this medication, and follow the directions you see here. Changed by: Felix Pacini, DO   B-12 1000 MCG Tabs Take 2 tablets by mouth daily.   Biotin 07371 MCG Tabs Take by mouth. 5,000 MCG daily   COLLAGEN PO Take by mouth.   dicyclomine 10 MG capsule Commonly known as: BENTYL TAKE 1 CAPSULE BY MOUTH FOUR TIMES DAILY BEFORE MEALS AND AT BEDTIME PRN   diphenoxylate-atropine 2.5-0.025 MG tablet Commonly known as: LOMOTIL TAKE 1 TABLET BY MOUTH FOUR TIMES DAILY AS NEEDED FOR DIARRHEA OR LOOSE STOOLS   drospirenone-ethinyl estradiol 3-0.02 MG tablet Commonly known as: YAZ Take 1 tablet by mouth daily.   EPINEPHrine 0.3 mg/0.3 mL Soaj injection Commonly known as: Auvi-Q Inject 0.3 mLs (0.3 mg total) into the muscle as needed for anaphylaxis.   fluticasone 50 MCG/ACT nasal spray Commonly known as: FLONASE Place into both nostrils daily.   levocetirizine 5 MG tablet Commonly known as: XYZAL TAKE 1 TABLET(5 MG) BY MOUTH EVERY EVENING   Magnesium 100 MG Tabs Take by mouth. W/ calcium and  zinc   montelukast 10 MG tablet Commonly known as: SINGULAIR Take 1 tablet (10 mg total) by mouth at bedtime.   multivitamin with minerals tablet Take 1 tablet by mouth daily.   nystatin-triamcinolone cream Commonly known as: MYCOLOG II SMARTSIG:Sparingly Topical 1 to 2 Times Daily PRN   olopatadine 0.1 % ophthalmic solution Commonly known as: Patanol Place 1 drop into both eyes 2 (two) times daily as needed (itchy/watery eyes).   Vitamin D (  Cholecalciferol) 25 MCG (1000 UT) Caps Take by mouth.       All past medical history, surgical history, allergies, family history, immunizations andmedications were updated in the EMR today and reviewed under the history and medication portions of their EMR.     ROS: Negative, with the exception of above mentioned in HPI   Objective:  BP 136/86   Pulse 94   Temp 99.1 F (37.3 C) (Oral)   Ht 5\' 5"  (1.651 m)   Wt 127 lb (57.6 kg)   SpO2 100%   BMI 21.13 kg/m  Body mass index is 21.13 kg/m. Gen: Afebrile. No acute distress.  HENT: AT. [place].  Eyes:Pupils Equal Round Reactive to light, Extraocular movements intact,  Conjunctiva without redness, discharge or icterus. CV: RRR Chest: CTAB Neuro:  Normal gait. PERLA. EOMi. Alert. Oriented x3 Psych: Normal affect, dress and demeanor. Normal speech. Normal thought content and judgment    No exam data present No results found. No results found for this or any previous visit (from the past 24 hour(s)).  Assessment/Plan: LOLLIE GUNNER is a 30 y.o. female present for OV for  Attention deficit disorder (ADD) without hyperactivity/Controlled substance agreement signed/history of concussion with focus deficit Stable.  Continue Adderall to 10 mg twice dail (#90 d scripts x2 provided) 26 controlled substance database reviewed 04/14/21 Controlled substance agreement signed  UDS completed Follow-up 3 months  Irritable bowel syndrome with diarrhea Patient reports her  irritable bowel syndrome is well controlled.  She is using the Lomotil and Bentyl as needed.  Seasonal allergies Following with allergist.   Extremity swelling and pain/cold extremities: Following with rheumatology.    Reviewed expectations re: course of current medical issues.  Discussed self-management of symptoms.  Outlined signs and symptoms indicating need for more acute intervention.  Patient verbalized understanding and all questions were answered.  Patient received an After-Visit Summary.    No orders of the defined types were placed in this encounter.  No orders of the defined types were placed in this encounter.  Referral Orders  No referral(s) requested today     Note is dictated utilizing voice recognition software. Although note has been proof read prior to signing, occasional typographical errors still can be missed. If any questions arise, please do not hesitate to call for verification.   electronically signed by:  06/14/21, DO  [facility] [place]

## 2021-04-20 ENCOUNTER — Ambulatory Visit (INDEPENDENT_AMBULATORY_CARE_PROVIDER_SITE_OTHER): Payer: 59 | Admitting: *Deleted

## 2021-04-20 DIAGNOSIS — J309 Allergic rhinitis, unspecified: Secondary | ICD-10-CM | POA: Diagnosis not present

## 2021-04-28 ENCOUNTER — Ambulatory Visit (INDEPENDENT_AMBULATORY_CARE_PROVIDER_SITE_OTHER): Payer: 59 | Admitting: Psychology

## 2021-04-28 DIAGNOSIS — F411 Generalized anxiety disorder: Secondary | ICD-10-CM

## 2021-05-04 ENCOUNTER — Ambulatory Visit (INDEPENDENT_AMBULATORY_CARE_PROVIDER_SITE_OTHER): Payer: 59 | Admitting: *Deleted

## 2021-05-04 DIAGNOSIS — J309 Allergic rhinitis, unspecified: Secondary | ICD-10-CM

## 2021-05-11 ENCOUNTER — Ambulatory Visit (INDEPENDENT_AMBULATORY_CARE_PROVIDER_SITE_OTHER): Payer: 59 | Admitting: *Deleted

## 2021-05-11 DIAGNOSIS — J309 Allergic rhinitis, unspecified: Secondary | ICD-10-CM | POA: Diagnosis not present

## 2021-05-12 ENCOUNTER — Ambulatory Visit (INDEPENDENT_AMBULATORY_CARE_PROVIDER_SITE_OTHER): Payer: 59 | Admitting: Psychology

## 2021-05-12 DIAGNOSIS — F411 Generalized anxiety disorder: Secondary | ICD-10-CM

## 2021-05-18 ENCOUNTER — Ambulatory Visit (INDEPENDENT_AMBULATORY_CARE_PROVIDER_SITE_OTHER): Payer: 59 | Admitting: *Deleted

## 2021-05-18 DIAGNOSIS — J309 Allergic rhinitis, unspecified: Secondary | ICD-10-CM

## 2021-05-25 ENCOUNTER — Ambulatory Visit (INDEPENDENT_AMBULATORY_CARE_PROVIDER_SITE_OTHER): Payer: 59 | Admitting: *Deleted

## 2021-05-25 DIAGNOSIS — J309 Allergic rhinitis, unspecified: Secondary | ICD-10-CM | POA: Diagnosis not present

## 2021-05-26 ENCOUNTER — Ambulatory Visit (INDEPENDENT_AMBULATORY_CARE_PROVIDER_SITE_OTHER): Payer: 59 | Admitting: Psychology

## 2021-05-26 DIAGNOSIS — F411 Generalized anxiety disorder: Secondary | ICD-10-CM | POA: Diagnosis not present

## 2021-06-01 ENCOUNTER — Ambulatory Visit (INDEPENDENT_AMBULATORY_CARE_PROVIDER_SITE_OTHER): Payer: 59

## 2021-06-01 DIAGNOSIS — J309 Allergic rhinitis, unspecified: Secondary | ICD-10-CM

## 2021-06-08 ENCOUNTER — Ambulatory Visit (INDEPENDENT_AMBULATORY_CARE_PROVIDER_SITE_OTHER): Payer: 59 | Admitting: *Deleted

## 2021-06-08 DIAGNOSIS — J309 Allergic rhinitis, unspecified: Secondary | ICD-10-CM | POA: Diagnosis not present

## 2021-06-11 ENCOUNTER — Ambulatory Visit (INDEPENDENT_AMBULATORY_CARE_PROVIDER_SITE_OTHER): Payer: 59 | Admitting: Physical Medicine and Rehabilitation

## 2021-06-11 ENCOUNTER — Other Ambulatory Visit: Payer: Self-pay

## 2021-06-11 ENCOUNTER — Encounter: Payer: Self-pay | Admitting: Physical Medicine and Rehabilitation

## 2021-06-11 DIAGNOSIS — R202 Paresthesia of skin: Secondary | ICD-10-CM | POA: Diagnosis not present

## 2021-06-11 NOTE — Progress Notes (Signed)
Redness in hands, feet, and face. Patient states it feels like sunburn. Tingling in fingers before hands turn red.  Right hand dominant + lotion

## 2021-06-15 NOTE — Progress Notes (Signed)
[name] - 30 y.o. female MRN [id number]  Date of birth: December 10, 1990  Office Visit Note: Visit Date: 06/11/2021 PCP: Natalia Leatherwood, DO Referred by: Natalia Leatherwood, DO  Subjective: Chief Complaint  Patient presents with   Right Hand - Numbness, Other   Left Hand - Numbness, Other   HPI:  Suzanne Jarvis is a 30 y.o. female who comes in today at the request of Dr. Sheliah Hatch for electrodiagnostic study of the Bilateral upper extremities.  Patient is Right hand dominant.  She complains of chronic worsening redness in the hands, feet and face and prior to the redness she will get a lot of tingling in the fingers.  The tingling is nondermatomal in all the fingers in both hands.  She does work as a Interior and spatial designer.  She has not had prior electrodiagnostic studies.  She does not endorse specific nocturnal complaints.  She does not endorse any radicular complaints.  Dr. 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  She has had positive ANA but no other abnormal laboratories.   ROS Otherwise per HPI.  Assessment & Plan: Visit Diagnoses:    ICD-10-CM   1. Paresthesia of skin  R20.2 NCV with EMG (electromyography)      Plan: Impression: Essentially NORMAL electrodiagnostic study of both upper limbs.  There is no significant electrodiagnostic evidence of nerve entrapment, brachial plexopathy or.    As you know, purely sensory or demyelinating radiculopathies and chemical radiculitis may not be detected with this particular electrodiagnostic study. **This electrodiagnostic study cannot rule out small fiber polyneuropathy and dysesthesias from central pain syndromes such as stroke or central pain sensitization syndromes such as fibromyalgia.  Myotomal referral pain from trigger points is also not excluded.  Recommendations: 1.  Follow-up with referring physician. 2.  Continue current management of symptoms.  If still  considering small fiber neuropathy from the erythromelalgia, can consider referral to one of the tertiary care centers for autonomic type electrodiagnostic study versus potential for neurology consultation for biopsy.  Meds & Orders: No orders of the defined types were placed in this encounter.   Orders Placed This Encounter  Procedures   NCV with EMG (electromyography)    Follow-up: Return for Sheliah Hatch, MD.   Procedures: No procedures performed  EMG & NCV Findings: All nerve conduction studies (as indicated in the following tables) were within normal limits.  Left vs. Right side comparison data for the ulnar motor nerve indicates abnormal L-R velocity difference (A Elbow-B Elbow, 29 m/s).  All remaining left vs. right side differences were within normal limits.    All examined muscles (as indicated in the following table) showed no evidence of electrical instability.    Impression: Essentially NORMAL electrodiagnostic study of both upper limbs.  There is no significant electrodiagnostic evidence of nerve entrapment, brachial plexopathy or.    As you know, purely sensory or demyelinating radiculopathies and chemical radiculitis may not be detected with this particular electrodiagnostic study. **This electrodiagnostic study cannot rule out small fiber polyneuropathy and dysesthesias from central pain syndromes such as stroke or central pain sensitization syndromes such as fibromyalgia.  Myotomal referral pain from trigger points is also not excluded.  Recommendations: 1.  Follow-up with referring physician. 2.  Continue current management of symptoms.  If still considering small fiber neuropathy from the erythromelalgia, can consider referral to one of the tertiary care centers for autonomic type electrodiagnostic study versus potential for neurology consultation  for biopsy.  ___________________________ Suzanne Jarvis FAAPMR Board Certified, American Board of Physical Medicine and  Rehabilitation    Nerve Conduction Studies Anti Sensory Summary Table   Stim Site NR Peak (ms) Norm Peak (ms) P-T Amp (V) Norm P-T Amp Site1 Site2 Delta-P (ms) Dist (cm) Vel (m/s) Norm Vel (m/s)  Left Median Acr Palm Anti Sensory (2nd Digit)  32.1C  Wrist    3.3 <3.6 53.5 >10 Wrist Palm 1.5 0.0    Palm    1.8 <2.0 86.3         Right Median Acr Palm Anti Sensory (2nd Digit)  30.8C  Wrist    3.4 <3.6 51.7 >10 Wrist Palm 1.5 0.0    Palm    1.9 <2.0 82.0         Left Radial Anti Sensory (Base 1st Digit)  32.7C  Wrist    2.1 <3.1 35.1  Wrist Base 1st Digit 2.1 0.0    Right Radial Anti Sensory (Base 1st Digit)  31.8C  Wrist    2.1 <3.1 49.9  Wrist Base 1st Digit 2.1 0.0    Left Ulnar Anti Sensory (5th Digit)  32.2C  Wrist    3.1 <3.7 47.9 >15.0 Wrist 5th Digit 3.1 14.0 45 >38  Right Ulnar Anti Sensory (5th Digit)  31.6C  Wrist    3.2 <3.7 38.2 >15.0 Wrist 5th Digit 3.2 14.0 44 >38   Motor Summary Table   Stim Site NR Onset (ms) Norm Onset (ms) O-P Amp (mV) Norm O-P Amp Site1 Site2 Delta-0 (ms) Dist (cm) Vel (m/s) Norm Vel (m/s)  Left Median Motor (Abd Poll Brev)  32.7C  Wrist    3.1 <4.2 9.3 >5 Elbow Wrist 3.5 19.0 54 >50  Elbow    6.6  9.2         Right Median Motor (Abd Poll Brev)  32C  Wrist    3.4 <4.2 10.9 >5 Elbow Wrist 3.4 19.5 57 >50  Elbow    6.8  10.8         Left Ulnar Motor (Abd Dig Min)  32.7C  Wrist    2.7 <4.2 10.2 >3 B Elbow Wrist 2.8 18.5 66 >53  B Elbow    5.5  11.0  A Elbow B Elbow 0.9 9.5 106 >53  A Elbow    6.4  11.3         Right Ulnar Motor (Abd Dig Min)  32C  Wrist    2.7 <4.2 11.8 >3 B Elbow Wrist 3.1 19.5 63 >53  B Elbow    5.8  11.8  A Elbow B Elbow 1.3 10.0 77 >53  A Elbow    7.1  11.9          EMG   Side Muscle Nerve Root Ins Act Fibs Psw Amp Dur Poly Recrt Int Dennie Bible Comment  Right Abd Poll Brev Median C8-T1 Nml Nml Nml Nml Nml 0 Nml Nml   Right 1stDorInt Ulnar C8-T1 Nml Nml Nml Nml Nml 0 Nml Nml   Right PronatorTeres Median C6-7 Nml Nml  Nml Nml Nml 0 Nml Nml   Right Biceps Musculocut C5-6 Nml Nml Nml Nml Nml 0 Nml Nml   Right Deltoid Axillary C5-6 Nml Nml Nml Nml Nml 0 Nml Nml     Nerve Conduction Studies Anti Sensory Left/Right Comparison   Stim Site L Lat (ms) R Lat (ms) L-R Lat (ms) L Amp (V) R Amp (V) L-R Amp (%) Site1 Site2 L Vel (m/s) R Vel (  m/s) L-R Vel (m/s)  Median Acr Palm Anti Sensory (2nd Digit)  32.1C  Wrist 3.3 3.4 0.1 53.5 51.7 3.4 Wrist Palm     Palm 1.8 1.9 0.1 86.3 82.0 5.0       Radial Anti Sensory (Base 1st Digit)  32.7C  Wrist 2.1 2.1 0.0 35.1 49.9 29.7 Wrist Base 1st Digit     Ulnar Anti Sensory (5th Digit)  32.2C  Wrist 3.1 3.2 0.1 47.9 38.2 20.3 Wrist 5th Digit 45 44 1   Motor Left/Right Comparison   Stim Site L Lat (ms) R Lat (ms) L-R Lat (ms) L Amp (mV) R Amp (mV) L-R Amp (%) Site1 Site2 L Vel (m/s) R Vel (m/s) L-R Vel (m/s)  Median Motor (Abd Poll Brev)  32.7C  Wrist 3.1 3.4 0.3 9.3 10.9 14.7 Elbow Wrist 54 57 3  Elbow 6.6 6.8 0.2 9.2 10.8 14.8       Ulnar Motor (Abd Dig Min)  32.7C  Wrist 2.7 2.7 0.0 10.2 11.8 13.6 B Elbow Wrist 66 63 3  B Elbow 5.5 5.8 0.3 11.0 11.8 6.8 A Elbow B Elbow 106 77 *29  A Elbow 6.4 7.1 0.7 11.3 11.9 5.0          Waveforms:                     Clinical History: No specialty comments available.     Objective:  VS:  HT:    WT:   BMI:     BP:   HR: bpm  TEMP: ( )  RESP:  Physical Exam Musculoskeletal:        General: No swelling, tenderness or deformity.     Comments: Inspection reveals no atrophy of the bilateral APB or FDI or hand intrinsics. There is no swelling, color changes, allodynia or dystrophic changes. There is 5 out of 5 strength in the bilateral wrist extension, finger abduction and long finger flexion. There is intact sensation to light touch in all dermatomal and peripheral nerve distributions. There is a negative Froment's test bilaterally. There is a negative Phalen's test bilaterally. There is a negative Hoffmann's  test bilaterally.  Skin:    General: Skin is warm and dry.     Findings: No erythema or rash.  Neurological:     General: No focal deficit present.     Mental Status: She is alert and oriented to person, place, and time.     Motor: No weakness or abnormal muscle tone.     Coordination: Coordination normal.  Psychiatric:        Mood and Affect: Mood normal.        Behavior: Behavior normal.     Imaging: No results found.

## 2021-06-15 NOTE — Procedures (Signed)
EMG & NCV Findings: All nerve conduction studies (as indicated in the following tables) were within normal limits.  Left vs. Right side comparison data for the ulnar motor nerve indicates abnormal L-R velocity difference (A Elbow-B Elbow, 29 m/s).  All remaining left vs. right side differences were within normal limits.    All examined muscles (as indicated in the following table) showed no evidence of electrical instability.    Impression: Essentially NORMAL electrodiagnostic study of both upper limbs.  There is no significant electrodiagnostic evidence of nerve entrapment, brachial plexopathy or.    As you know, purely sensory or demyelinating radiculopathies and chemical radiculitis may not be detected with this particular electrodiagnostic study. **This electrodiagnostic study cannot rule out small fiber polyneuropathy and dysesthesias from central pain syndromes such as stroke or central pain sensitization syndromes such as fibromyalgia.  Myotomal referral pain from trigger points is also not excluded.  Recommendations: 1.  Follow-up with referring physician. 2.  Continue current management of symptoms.  If still considering small fiber neuropathy from the erythromelalgia, can consider referral to one of the tertiary care centers for autonomic type electrodiagnostic study versus potential for neurology consultation for biopsy.  ___________________________ Naaman Plummer FAAPMR Board Certified, American Board of Physical Medicine and Rehabilitation    Nerve Conduction Studies Anti Sensory Summary Table   Stim Site NR Peak (ms) Norm Peak (ms) P-T Amp (V) Norm P-T Amp Site1 Site2 Delta-P (ms) Dist (cm) Vel (m/s) Norm Vel (m/s)  Left Median Acr Palm Anti Sensory (2nd Digit)  32.1C  Wrist    3.3 <3.6 53.5 >10 Wrist Palm 1.5 0.0    Palm    1.8 <2.0 86.3         Right Median Acr Palm Anti Sensory (2nd Digit)  30.8C  Wrist    3.4 <3.6 51.7 >10 Wrist Palm 1.5 0.0    Palm    1.9 <2.0 82.0          Left Radial Anti Sensory (Base 1st Digit)  32.7C  Wrist    2.1 <3.1 35.1  Wrist Base 1st Digit 2.1 0.0    Right Radial Anti Sensory (Base 1st Digit)  31.8C  Wrist    2.1 <3.1 49.9  Wrist Base 1st Digit 2.1 0.0    Left Ulnar Anti Sensory (5th Digit)  32.2C  Wrist    3.1 <3.7 47.9 >15.0 Wrist 5th Digit 3.1 14.0 45 >38  Right Ulnar Anti Sensory (5th Digit)  31.6C  Wrist    3.2 <3.7 38.2 >15.0 Wrist 5th Digit 3.2 14.0 44 >38   Motor Summary Table   Stim Site NR Onset (ms) Norm Onset (ms) O-P Amp (mV) Norm O-P Amp Site1 Site2 Delta-0 (ms) Dist (cm) Vel (m/s) Norm Vel (m/s)  Left Median Motor (Abd Poll Brev)  32.7C  Wrist    3.1 <4.2 9.3 >5 Elbow Wrist 3.5 19.0 54 >50  Elbow    6.6  9.2         Right Median Motor (Abd Poll Brev)  32C  Wrist    3.4 <4.2 10.9 >5 Elbow Wrist 3.4 19.5 57 >50  Elbow    6.8  10.8         Left Ulnar Motor (Abd Dig Min)  32.7C  Wrist    2.7 <4.2 10.2 >3 B Elbow Wrist 2.8 18.5 66 >53  B Elbow    5.5  11.0  A Elbow B Elbow 0.9 9.5 106 >53  A Elbow    6.4  11.3         Right Ulnar Motor (Abd Dig Min)  32C  Wrist    2.7 <4.2 11.8 >3 B Elbow Wrist 3.1 19.5 63 >53  B Elbow    5.8  11.8  A Elbow B Elbow 1.3 10.0 77 >53  A Elbow    7.1  11.9          EMG   Side Muscle Nerve Root Ins Act Fibs Psw Amp Dur Poly Recrt Int Dennie Bible Comment  Right Abd Poll Brev Median C8-T1 Nml Nml Nml Nml Nml 0 Nml Nml   Right 1stDorInt Ulnar C8-T1 Nml Nml Nml Nml Nml 0 Nml Nml   Right PronatorTeres Median C6-7 Nml Nml Nml Nml Nml 0 Nml Nml   Right Biceps Musculocut C5-6 Nml Nml Nml Nml Nml 0 Nml Nml   Right Deltoid Axillary C5-6 Nml Nml Nml Nml Nml 0 Nml Nml     Nerve Conduction Studies Anti Sensory Left/Right Comparison   Stim Site L Lat (ms) R Lat (ms) L-R Lat (ms) L Amp (V) R Amp (V) L-R Amp (%) Site1 Site2 L Vel (m/s) R Vel (m/s) L-R Vel (m/s)  Median Acr Palm Anti Sensory (2nd Digit)  32.1C  Wrist 3.3 3.4 0.1 53.5 51.7 3.4 Wrist Palm     Palm 1.8 1.9 0.1 86.3 82.0  5.0       Radial Anti Sensory (Base 1st Digit)  32.7C  Wrist 2.1 2.1 0.0 35.1 49.9 29.7 Wrist Base 1st Digit     Ulnar Anti Sensory (5th Digit)  32.2C  Wrist 3.1 3.2 0.1 47.9 38.2 20.3 Wrist 5th Digit 45 44 1   Motor Left/Right Comparison   Stim Site L Lat (ms) R Lat (ms) L-R Lat (ms) L Amp (mV) R Amp (mV) L-R Amp (%) Site1 Site2 L Vel (m/s) R Vel (m/s) L-R Vel (m/s)  Median Motor (Abd Poll Brev)  32.7C  Wrist 3.1 3.4 0.3 9.3 10.9 14.7 Elbow Wrist 54 57 3  Elbow 6.6 6.8 0.2 9.2 10.8 14.8       Ulnar Motor (Abd Dig Min)  32.7C  Wrist 2.7 2.7 0.0 10.2 11.8 13.6 B Elbow Wrist 66 63 3  B Elbow 5.5 5.8 0.3 11.0 11.8 6.8 A Elbow B Elbow 106 77 *29  A Elbow 6.4 7.1 0.7 11.3 11.9 5.0          Waveforms:

## 2021-06-22 ENCOUNTER — Ambulatory Visit (INDEPENDENT_AMBULATORY_CARE_PROVIDER_SITE_OTHER): Payer: 59 | Admitting: *Deleted

## 2021-06-22 DIAGNOSIS — J309 Allergic rhinitis, unspecified: Secondary | ICD-10-CM

## 2021-06-24 ENCOUNTER — Telehealth: Payer: Self-pay

## 2021-06-24 NOTE — Telephone Encounter (Signed)
Patient called stating Dr. Dimple Casey referred her to Dr. Alvester Morin for nerve conduction study which she had on 06/11/21.  Patient states she was told after the study she would receive a call from Dr. Dimple Casey to discuss and set up plans for what treatment is needed.

## 2021-06-29 ENCOUNTER — Ambulatory Visit (INDEPENDENT_AMBULATORY_CARE_PROVIDER_SITE_OTHER): Payer: 59 | Admitting: *Deleted

## 2021-06-29 DIAGNOSIS — J309 Allergic rhinitis, unspecified: Secondary | ICD-10-CM | POA: Diagnosis not present

## 2021-07-05 ENCOUNTER — Telehealth: Payer: Self-pay

## 2021-07-05 ENCOUNTER — Encounter: Payer: Self-pay | Admitting: Internal Medicine

## 2021-07-05 NOTE — Telephone Encounter (Signed)
FYI- I spoke with Suzanne Jarvis regarding her test it was unremarkable without any evidence of impingement at the wrist or elbow. Neuro referral for biopsy I do not expect to be very helpful with suspect erythromelalgia process but can consider. At this point I think a trial of HCQ for symptoms with persistent positive ANA and other issues would be appropriate. Plan to send/message drug information to review. She also now has great toe pain and stiffness ongoing for about a month and scheduled a visit with PCP office for 8/24. Will plan to follow up with her after that exam in case it is helpful.

## 2021-07-05 NOTE — Telephone Encounter (Signed)
Patient left a voicemail stating she left a message 2 weeks ago and hasn't received a return call.  Patient states she had her nerve testing almost 1 month ago with Dr. Alvester Morin.  Patient requested a return call to let her know what she needs to do next.

## 2021-07-06 ENCOUNTER — Ambulatory Visit (INDEPENDENT_AMBULATORY_CARE_PROVIDER_SITE_OTHER): Payer: 59 | Admitting: *Deleted

## 2021-07-06 DIAGNOSIS — J309 Allergic rhinitis, unspecified: Secondary | ICD-10-CM

## 2021-07-07 ENCOUNTER — Ambulatory Visit (INDEPENDENT_AMBULATORY_CARE_PROVIDER_SITE_OTHER): Payer: 59 | Admitting: Family Medicine

## 2021-07-07 ENCOUNTER — Ambulatory Visit (HOSPITAL_BASED_OUTPATIENT_CLINIC_OR_DEPARTMENT_OTHER)
Admission: RE | Admit: 2021-07-07 | Discharge: 2021-07-07 | Disposition: A | Discharge status: Home / Self Care | Payer: 59 | Source: Ambulatory Visit | Attending: Family Medicine | Admitting: Family Medicine

## 2021-07-07 ENCOUNTER — Other Ambulatory Visit: Payer: Self-pay

## 2021-07-07 ENCOUNTER — Encounter: Payer: Self-pay | Admitting: Family Medicine

## 2021-07-07 VITALS — BP 119/76 | HR 101 | Temp 98.9°F | Ht 65.0 in | Wt 130.0 lb

## 2021-07-07 DIAGNOSIS — M7741 Metatarsalgia, right foot: Secondary | ICD-10-CM | POA: Diagnosis not present

## 2021-07-07 DIAGNOSIS — M79674 Pain in right toe(s): Secondary | ICD-10-CM

## 2021-07-07 DIAGNOSIS — M25571 Pain in right ankle and joints of right foot: Secondary | ICD-10-CM | POA: Diagnosis not present

## 2021-07-07 DIAGNOSIS — G8929 Other chronic pain: Secondary | ICD-10-CM | POA: Insufficient documentation

## 2021-07-07 HISTORY — DX: Metatarsalgia, right foot: M77.41

## 2021-07-07 LAB — URIC ACID: Uric Acid, Serum: 5.1 mg/dL (ref 2.4–7.0)

## 2021-07-07 IMAGING — MR MR HEAD WO/W CM
10 series · 48 of 48 positions shown · IV contrast (gadavist)
Comparison: None.

CLINICAL DATA: Occipital headaches with blurry vision

EXAM:
MRI HEAD WITHOUT AND WITH CONTRAST
TECHNIQUE: Multiplanar, multiecho pulse sequences of the brain and surrounding
structures were obtained without and with intravenous contrast.
CONTRAST:  6mL GADAVIST GADOBUTROL 1 MMOL/ML IV SOLN

[Series 2: T1 · sagittal · 5.0mm · 0.86mm/px · 2 of 27 slices shown (1 of 2)]
[im 1/27]
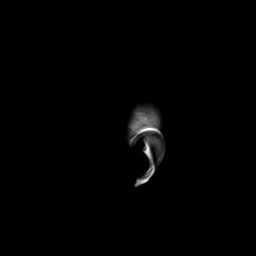
[im 27/27]
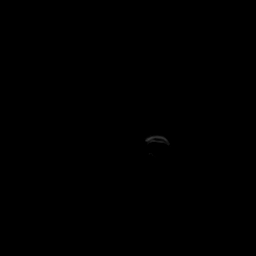

[Series 3: DWI · axial · 3.0mm · 1.80mm/px · z∈[-40,+106]mm · 8 of 92 slices shown (1 of 2)]
[im 1/92]
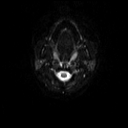
[im 14/92]
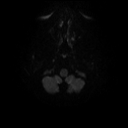
[im 27/92]
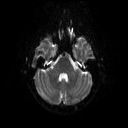
[im 40/92]
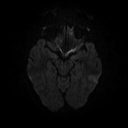
[im 53/92]
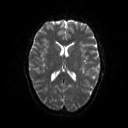
[im 66/92]
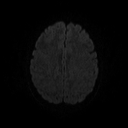
[im 79/92]
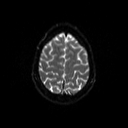
[im 92/92]
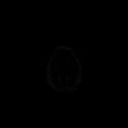

[Series 4: DWI · axial · 3.0mm · 1.80mm/px · z∈[-40,+106]mm · 4 of 46 slices shown (2 of 2)]
[im 1/46]
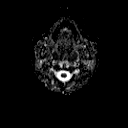
[im 16/46]
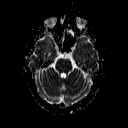
[im 31/46]
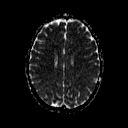
[im 46/46]
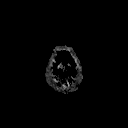

[Series 5: T2 · axial · 5.0mm · 0.66mm/px · z∈[-42,+106]mm · 3 of 26 slices shown (1 of 3)]
[im 1/26]
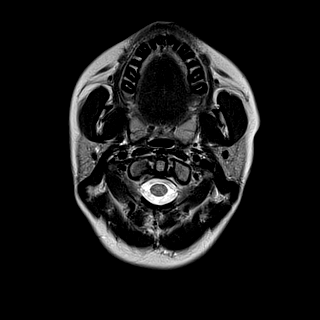
[im 13/26]
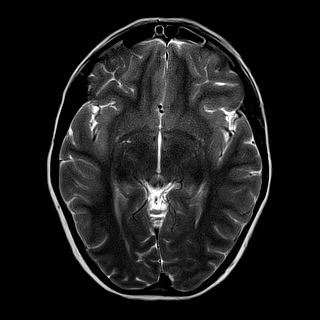
[im 26/26]
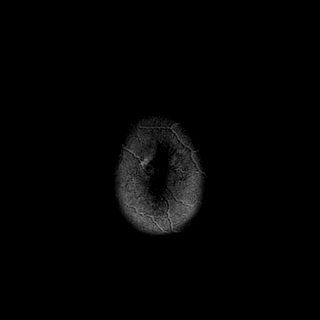

[Series 6: T2 · axial · 5.0mm · 0.41mm/px · z∈[-42,+106]mm · 3 of 26 slices shown (2 of 3)]
[im 1/26]
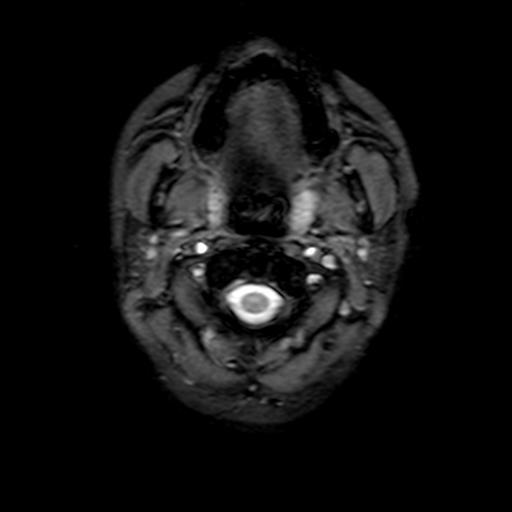
[im 13/26]
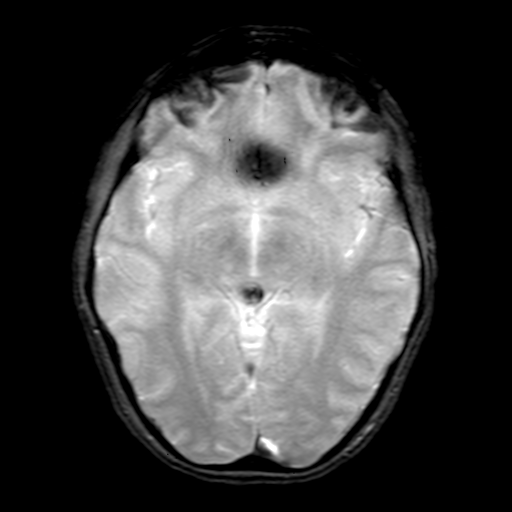
[im 26/26]
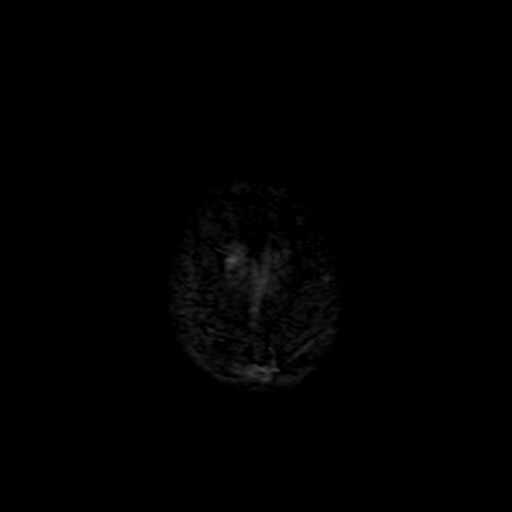

[Series 7: FLAIR · axial · 3.0mm · 0.41mm/px · z∈[-45,+108]mm · 4 of 40 slices shown]
[im 1/40]
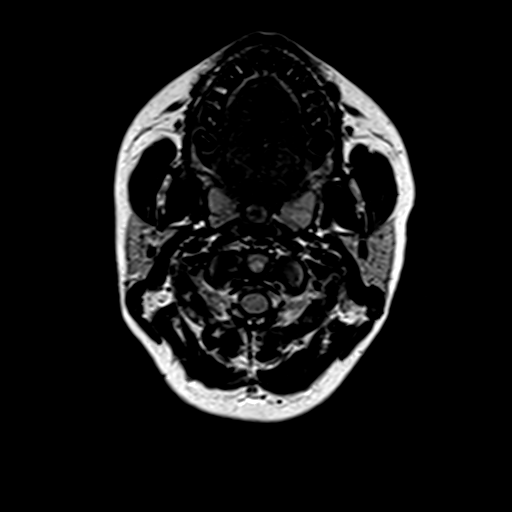
[im 14/40]
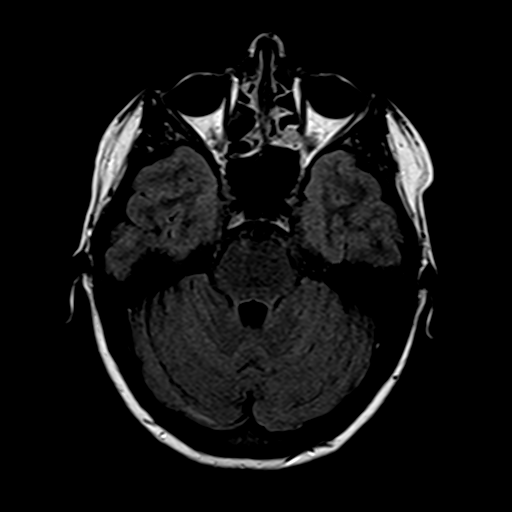
[im 27/40]
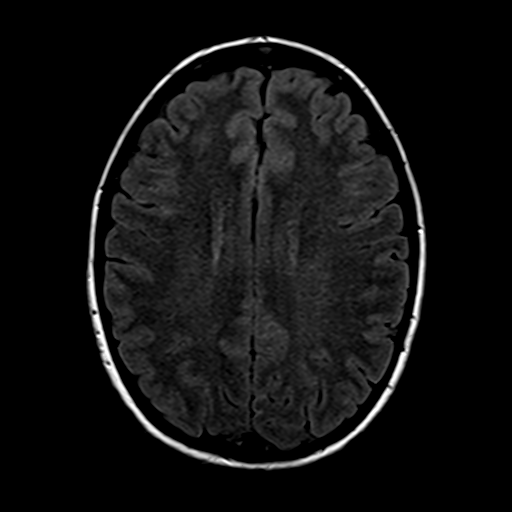
[im 40/40]
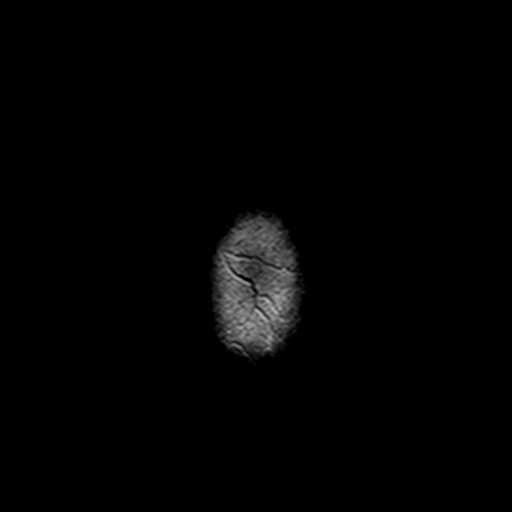

[Series 8: t1_3d_tra · axial · 2.0mm · 0.86mm/px · z∈[-47,+125]mm · 9 of 88 slices shown]
[im 1/88]
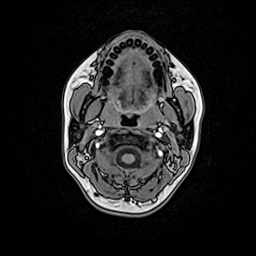
[im 11/88]
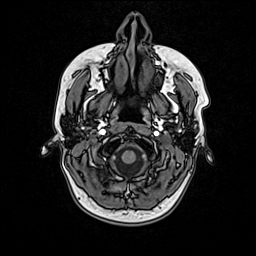
[im 22/88]
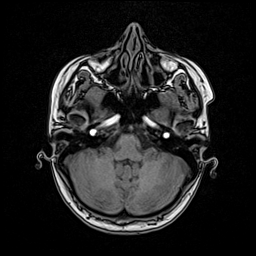
[im 33/88]
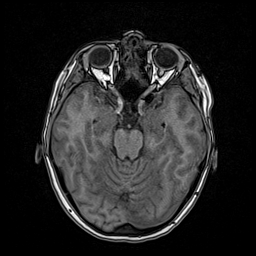
[im 44/88]
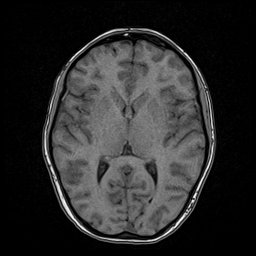
[im 55/88]
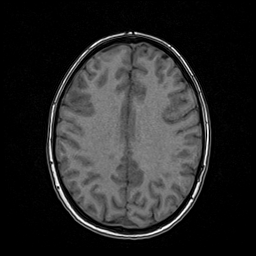
[im 66/88]
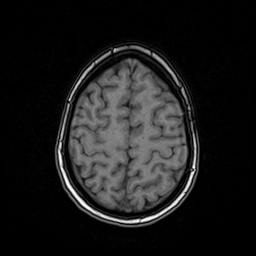
[im 77/88]
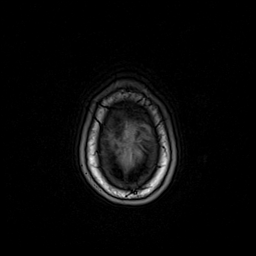
[im 88/88]
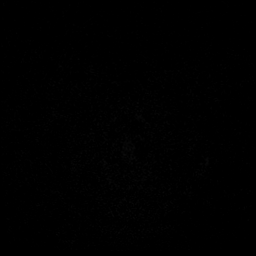

[Series 9: T2 · coronal · 5.0mm · 0.62mm/px · 3 of 28 slices shown (3 of 3)]
[im 1/28]
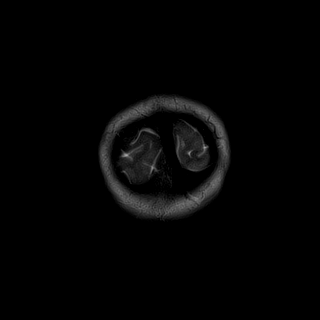
[im 14/28]
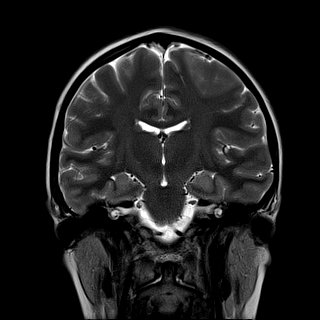
[im 28/28]
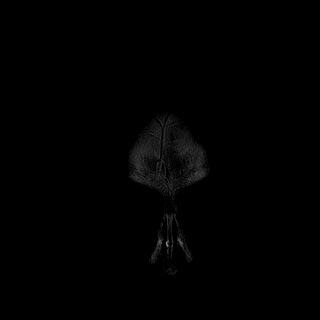

[Series 10: t1_3d_tra +c · axial · 2.0mm · 0.86mm/px · z∈[-47,+125]mm · 9 of 88 slices shown]
[im 1/88]
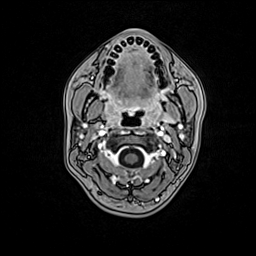
[im 11/88]
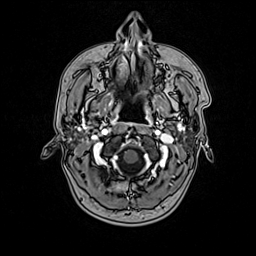
[im 22/88]
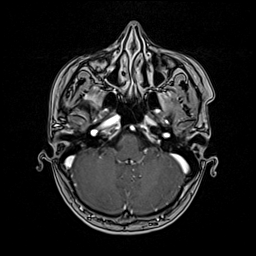
[im 33/88]
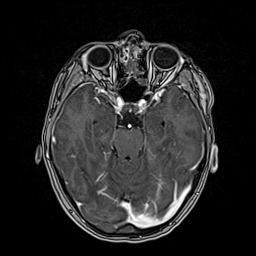
[im 44/88]
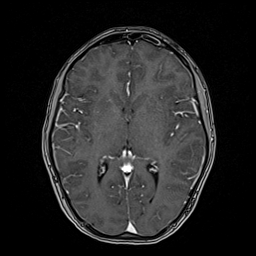
[im 55/88]
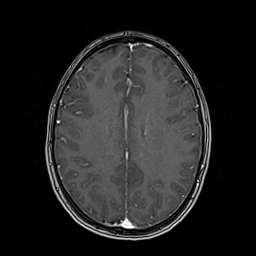
[im 66/88]
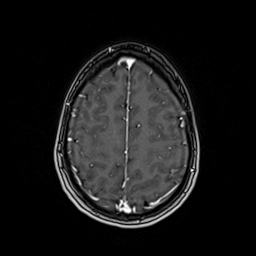
[im 77/88]
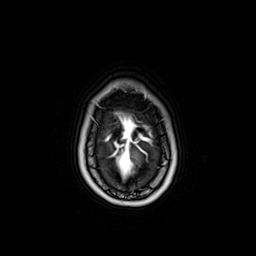
[im 88/88]
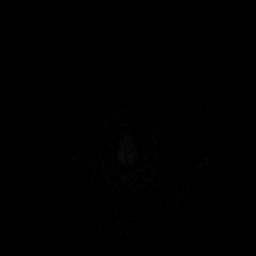

[Series 11: T1 · coronal · 5.0mm · 0.78mm/px · 3 of 28 slices shown (2 of 2)]
[im 1/28]
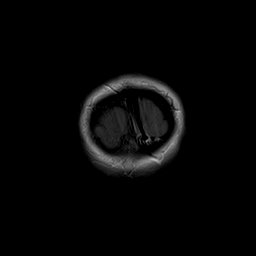
[im 14/28]
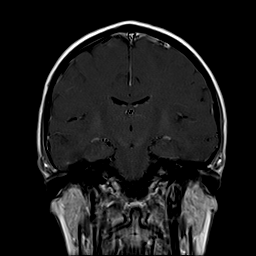
[im 28/28]
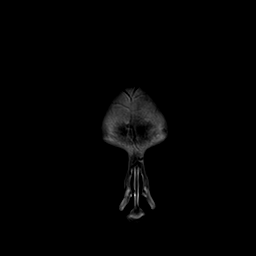

[48 of 48 positions shown; findings below may reference images not displayed]

FINDINGS: BRAIN: No acute infarct, acute hemorrhage or extra-axial collection.
Normal white matter signal. Normal volume of CSF spaces. No chronic
microhemorrhage. Normal midline structures.

VASCULAR: Major flow voids are preserved.

SKULL AND UPPER CERVICAL SPINE: Normal calvarium and skull base.
Visualized upper cervical spine and soft tissues are normal.

SINUSES/ORBITS: Right maxillary sinus atelectasis. Mild left
maxillary sinus and left ethmoid mucosal thickening. Normal orbits.
IMPRESSION: Normal brain MRI.

## 2021-07-07 NOTE — Patient Instructions (Signed)
Considering gout vs arthritis vs overuse injury from working out.  I would recommend metatarsalgia pads for your foot in shoes with workouts and standing long periods.

## 2021-07-07 NOTE — Progress Notes (Signed)
This visit occurred during the SARS-CoV-2 public health emergency.  Safety protocols were in place, including screening questions prior to the visit, additional usage of staff PPE, and extensive cleaning of exam room while observing appropriate contact time as indicated for disinfecting solutions.    Suzanne Jarvis , [birth year], 30 y.o., female MRN: [id number] Patient Care Team    Relationship Specialty Notifications Start End  Natalia Leatherwood, DO PCP - General Family Medicine  09/04/15   Rodell Perna, NP (Inactive) Nurse Practitioner Obstetrics and Gynecology  01/29/20     Chief Complaint  Patient presents with   Toe Pain    Pt c/o R toe pain described as an aching pain x 1 mo     Subjective: Pt presents for an OV with complaints of pain in her right large toe of approximately 1 month duration.  She reports the pain is constant ache, pointing to both the distal and proximal joint of right large toe.  She reports this past Saturday the toe pain became worse and she was experiencing shooting pain up her foot.  On occasions she has noticed mild swelling and erythema in this location.  She denies any injury prior to onset.  She does endorse starting to work out a few months ago.  Her workouts consist of more circuit training type workout.  She does wear our well supportive tennis shoes during her workout.  She has not tried anything to help relieve the discomfort, except for on Saturday she should take some Advil to help with the shooting pain.  She denies any herbal or medication changes over the last few months.  No history of arthritis.  Family history of osteoarthritis in grandma.  Depression screen [facility] 2/9 10/14/2020 01/29/2020 11/20/2019  Decreased Interest 0 0 0  Down, Depressed, Hopeless 0 0 0  PHQ - 2 Score 0 0 0    Allergies  Allergen Reactions   Aspirin Anaphylaxis   Amlodipine Other (See Comments)    Per patient burning sensation through body   Amoxicillin Hives    Hyoscyamine Other (See Comments)   Mixed Grasses    Novocain [Procaine] Swelling   Other      grass, weed, ragweed, trees, cat, mold, dog, dust mites. Borderline to cockroach.     Paxil [Paroxetine Hcl]     Dizziness    Penicillins     REACTION: hives   Social History   Social History Narrative   Not on file   Past Medical History:  Diagnosis Date   ADD (attention deficit disorder) 07/08/2020   Angio-edema    ASCUS with positive high risk HPV cervical 03/26/2019   01/2019; negative colposcopy 03/2019; plan repeat pap and HPV test 01/2020   Asthma    Chronic headaches    Concussion with no loss of consciousness 07/08/2020   May 2021   Eczema    IBS (irritable bowel syndrome)    Penicillin allergy 04/28/2020   Seasonal and perennial allergic rhinoconjunctivitis 07/28/2020   Tonsillitis    Urticaria    Past Surgical History:  Procedure Laterality Date   WISDOM TOOTH EXTRACTION     Family History  Problem Relation Age of Onset   Allergies Mother    Asthma Mother    Hypertension Mother    Allergic rhinitis Mother    Urticaria Mother    Arthritis Maternal Grandmother    Irritable bowel syndrome Maternal Grandmother    Breast cancer Maternal Grandmother    Allergic  rhinitis Sister    Eczema Sister    Anemia Sister    Hashimoto's thyroiditis Brother    ADD / ADHD Brother    Lung cancer Other        paternal great grandfather   Lung cancer Other        maternal great grandfather   Cancer Other        Blood-great grandmother   Lupus Cousin    Hashimoto's thyroiditis Cousin    Colon cancer Neg Hx    Allergies as of 07/07/2021       Reactions   Aspirin Anaphylaxis   Amlodipine Other (See Comments)   Per patient burning sensation through body   Amoxicillin Hives   Hyoscyamine Other (See Comments)   Mixed Grasses    Novocain [procaine] Swelling   Other     grass, weed, ragweed, trees, cat, mold, dog, dust mites. Borderline to cockroach.     Paxil [paroxetine Hcl]     Dizziness   Penicillins    REACTION: hives        Medication List        Accurate as of July 07, 2021 12:28 PM. If you have any questions, ask your nurse or doctor.          STOP taking these medications    Azelastine HCl 0.15 % Soln Stopped by: Felix Pacini, DO   Biotin 12458 MCG Tabs Stopped by: Felix Pacini, DO   fluticasone 50 MCG/ACT nasal spray Commonly known as: FLONASE Stopped by: Felix Pacini, DO       TAKE these medications    amphetamine-dextroamphetamine 10 MG tablet Commonly known as: Adderall Take 1 tablet (10 mg total) by mouth 2 (two) times daily.   amphetamine-dextroamphetamine 10 MG tablet Commonly known as: ADDERALL Take 1 tablet (10 mg total) by mouth 2 (two) times daily.   ascorbic acid 500 MG tablet Commonly known as: VITAMIN C Take by mouth.   B-12 1000 MCG Tabs Take 2 tablets by mouth daily.   COLLAGEN PO Take by mouth.   dicyclomine 10 MG capsule Commonly known as: BENTYL TAKE 1 CAPSULE BY MOUTH FOUR TIMES DAILY BEFORE MEALS AND AT BEDTIME PRN   diphenoxylate-atropine 2.5-0.025 MG tablet Commonly known as: LOMOTIL TAKE 1 TABLET BY MOUTH FOUR TIMES DAILY AS NEEDED FOR DIARRHEA OR LOOSE STOOLS   drospirenone-ethinyl estradiol 3-0.02 MG tablet Commonly known as: YAZ Take 1 tablet by mouth daily.   EPINEPHrine 0.3 mg/0.3 mL Soaj injection Commonly known as: Auvi-Q Inject 0.3 mLs (0.3 mg total) into the muscle as needed for anaphylaxis.   levocetirizine 5 MG tablet Commonly known as: XYZAL TAKE 1 TABLET(5 MG) BY MOUTH EVERY EVENING   Magnesium 100 MG Tabs Take by mouth. W/ calcium and zinc   montelukast 10 MG tablet Commonly known as: SINGULAIR Take 1 tablet (10 mg total) by mouth at bedtime.   multivitamin with minerals tablet Take 1 tablet by mouth daily.   nystatin-triamcinolone cream Commonly known as: MYCOLOG II SMARTSIG:Sparingly Topical 1 to 2 Times Daily PRN   olopatadine 0.1 % ophthalmic  solution Commonly known as: Patanol Place 1 drop into both eyes 2 (two) times daily as needed (itchy/watery eyes).   Vitamin D (Cholecalciferol) 25 MCG (1000 UT) Caps Take by mouth.        All past medical history, surgical history, allergies, family history, immunizations andmedications were updated in the EMR today and reviewed under the history and medication portions of their EMR.  ROS: Negative, with the exception of above mentioned in HPI   Objective:  BP 119/76   Pulse (!) 101   Temp 98.9 F (37.2 C) (Oral)   Ht 5\' 5"  (1.651 m)   Wt 130 lb (59 kg)   SpO2 100%   BMI 21.63 kg/m  Body mass index is 21.63 kg/m. Gen: Afebrile. No acute distress. Nontoxic in appearance, well developed, well nourished.  HENT: AT. [place].  Eyes:Pupils Equal Round Reactive to light, Extraocular movements intact,  Conjunctiva without redness, discharge or icterus. MSK (right large toe): No erythema, no soft tissue swelling present on exam today.  Decreased range of motion in flexion and extension with discomfort in both ranges of motion.  Neurovascularly intact distally. Skin: No rashes, purpura or petechiae.  Neuro: Normal gait. PERLA. EOMi. Alert. Oriented x3 Psych: Normal affect, dress and demeanor. Normal speech. Normal thought content and judgment.  No results found. No results found. No results found for this or any previous visit (from the past 24 hour(s)).  Assessment/Plan: SHARDEA CWYNAR is a 30 y.o. female present for OV for  Toe pain, right/metatarsalgia - Uric acid - DG Toe Great Right; Future -If x-ray is positive for degenerative appearance would consider Mobic/diclofenac use temporarily. -If x-ray is normal consider NSAID versus steroid taper, depending upon uric acid levels. -Encouraged her to purchase metatarsalgia pads to place in her workout shoes, examples were provided to her today.  This simply may be an overuse injury secondary to her starting to work out. It also  may be associated with her current  Autoimmune/rheumatological condition. Patient will be called with results and further plan discussed at that time.  Reviewed expectations re: course of current medical issues. Discussed self-management of symptoms. Outlined signs and symptoms indicating need for more acute intervention. Patient verbalized understanding and all questions were answered. Patient received an After-Visit Summary.    Orders Placed This Encounter  Procedures   DG Toe Great Right   Uric acid   No orders of the defined types were placed in this encounter.  Referral Orders  No referral(s) requested today     Note is dictated utilizing voice recognition software. Although note has been proof read prior to signing, occasional typographical errors still can be missed. If any questions arise, please do not hesitate to call for verification.   electronically signed by:  26, DO  [facility] [place]

## 2021-07-08 ENCOUNTER — Telehealth: Payer: Self-pay | Admitting: Family Medicine

## 2021-07-08 MED ORDER — DICLOFENAC SODIUM ER 100 MG PO TB24: 100.0000 mg | ORAL_TABLET | Freq: Every day | ORAL | 2 refills | Status: DC

## 2021-07-08 NOTE — Telephone Encounter (Signed)
Please inform patient the following information: The uric acid level was normal, therefore that ruled out any type of gout causing pain. Her toe x-ray was also normal no signs of fracture or degeneration/arthritis. I recommend we start with the padding in her shoe that we discussed during her visit and start diclofenac once daily with meals.  This is an anti-inflammatory and will help calm down the inflammatory process of her toe.  I would encourage her to take this medicine for 2-4 weeks, and then try to discontinue if able.  -She had been on this medicine last summer for couple months and was able to tolerate.  If pain persists past 2-4 weeks, or worsens with treatment, I would encourage her to call in and we would likely refer her to sports medicine for evaluation.

## 2021-07-08 NOTE — Telephone Encounter (Signed)
Spoke with pt regarding labs and instructions.   

## 2021-07-22 ENCOUNTER — Ambulatory Visit (INDEPENDENT_AMBULATORY_CARE_PROVIDER_SITE_OTHER): Payer: 59

## 2021-07-22 DIAGNOSIS — J309 Allergic rhinitis, unspecified: Secondary | ICD-10-CM | POA: Diagnosis not present

## 2021-07-26 ENCOUNTER — Telehealth: Payer: Self-pay

## 2021-07-26 NOTE — Telephone Encounter (Signed)
Patient left a voicemail requesting a return call stating she has information she would like to discuss with Dr. Dimple Casey.

## 2021-07-27 ENCOUNTER — Ambulatory Visit (INDEPENDENT_AMBULATORY_CARE_PROVIDER_SITE_OTHER): Payer: 59 | Admitting: *Deleted

## 2021-07-27 DIAGNOSIS — J309 Allergic rhinitis, unspecified: Secondary | ICD-10-CM | POA: Diagnosis not present

## 2021-07-27 MED ORDER — HYDROXYCHLOROQUINE SULFATE 200 MG PO TABS: 200.0000 mg | ORAL_TABLET | Freq: Every day | ORAL | 2 refills | Status: DC

## 2021-07-27 NOTE — Telephone Encounter (Signed)
I spoke with Suzanne Jarvis about start hydroxychloroquine discussed side effects such as Gi intolerance or rashes, and long term need for ophthalmology f/u for screening. Please schedule a f/u appointment in about 8 wks to see how her response is.

## 2021-08-04 ENCOUNTER — Ambulatory Visit: Payer: 59 | Admitting: Allergy

## 2021-08-05 ENCOUNTER — Ambulatory Visit (INDEPENDENT_AMBULATORY_CARE_PROVIDER_SITE_OTHER): Payer: 59 | Admitting: *Deleted

## 2021-08-05 DIAGNOSIS — J309 Allergic rhinitis, unspecified: Secondary | ICD-10-CM | POA: Diagnosis not present

## 2021-08-11 ENCOUNTER — Encounter: Payer: Self-pay | Admitting: Internal Medicine

## 2021-08-11 DIAGNOSIS — R110 Nausea: Secondary | ICD-10-CM

## 2021-08-11 MED ORDER — ONDANSETRON HCL 4 MG PO TABS: 4.0000 mg | ORAL_TABLET | Freq: Three times a day (TID) | ORAL | 0 refills | Status: DC | PRN

## 2021-08-13 ENCOUNTER — Ambulatory Visit: Payer: Self-pay | Admitting: *Deleted

## 2021-08-13 ENCOUNTER — Other Ambulatory Visit: Payer: Self-pay

## 2021-08-13 ENCOUNTER — Encounter: Payer: Self-pay | Admitting: Allergy

## 2021-08-13 ENCOUNTER — Ambulatory Visit (INDEPENDENT_AMBULATORY_CARE_PROVIDER_SITE_OTHER): Payer: 59 | Admitting: Allergy

## 2021-08-13 VITALS — BP 120/82 | HR 87 | Temp 98.8°F | Resp 18 | Ht 65.0 in | Wt 132.2 lb

## 2021-08-13 DIAGNOSIS — H1010 Acute atopic conjunctivitis, unspecified eye: Secondary | ICD-10-CM

## 2021-08-13 DIAGNOSIS — T50905D Adverse effect of unspecified drugs, medicaments and biological substances, subsequent encounter: Secondary | ICD-10-CM

## 2021-08-13 DIAGNOSIS — J309 Allergic rhinitis, unspecified: Secondary | ICD-10-CM | POA: Diagnosis not present

## 2021-08-13 DIAGNOSIS — H1013 Acute atopic conjunctivitis, bilateral: Secondary | ICD-10-CM | POA: Diagnosis not present

## 2021-08-13 DIAGNOSIS — J302 Other seasonal allergic rhinitis: Secondary | ICD-10-CM

## 2021-08-13 DIAGNOSIS — Z8709 Personal history of other diseases of the respiratory system: Secondary | ICD-10-CM

## 2021-08-13 DIAGNOSIS — Z77098 Contact with and (suspected) exposure to other hazardous, chiefly nonmedicinal, chemicals: Secondary | ICD-10-CM

## 2021-08-13 MED ORDER — MONTELUKAST SODIUM 10 MG PO TABS: 10.0000 mg | ORAL_TABLET | Freq: Every day | ORAL | 3 refills | Status: DC

## 2021-08-13 MED ORDER — LEVOCETIRIZINE DIHYDROCHLORIDE 5 MG PO TABS: ORAL_TABLET | ORAL | 3 refills | Status: DC

## 2021-08-13 MED ORDER — EPINEPHRINE 0.3 MG/0.3ML IJ SOAJ: 0.3000 mg | INTRAMUSCULAR | 1 refills | Status: DC | PRN

## 2021-08-13 NOTE — Assessment & Plan Note (Signed)
Past history - Broke out in hives after taking penicillin. Adverse reaction to aspirin - most likely non-IgE mediated. Tolerates other NSAIDs.  Consider penicillin testing in future - next winter when able to come off antihistamines for 3 days. . Continue to avoid aspirin only. Okay to take other NSAIDs.

## 2021-08-13 NOTE — Assessment & Plan Note (Signed)
Past history - Rhino conjunctivitis symptoms from the spring through fall for the past 5 years. 2 sinus infections per year. 2021 skin testing showed: Positive to grass, weed, ragweed, trees, cat, mold, dog, dust mites. Borderline to cockroach.  Negative to common foods. Started AIT on 05/20/2020 (G-W-RW-T and M-C-D-CR-DM) Interim history -  Localized reactions with injections improved. Does not tolerate nasal sprays.  Continue allergy injections - given today.  If you have a reactions next time let us know   Continue environmental control measures as below. . Use over the counter antihistamines such as Zyrtec (cetirizine), Claritin (loratadine), Allegra (fexofenadine), or Xyzal (levocetirizine) daily as needed. May take twice a day during allergy flares. May switch antihistamines every few months. . Continue with Singulair (montelukast) 10mg  daily at night.  May use olopatadine eye drops 0.1% twice a day as needed for itchy/watery eyes.  Nasal saline spray (i.e., Simply Saline) or nasal saline lavage (i.e., NeilMed) is recommended as needed and prior to medicated nasal sprays.

## 2021-08-13 NOTE — Assessment & Plan Note (Signed)
Past history - Notes frequent sinus infections needing frequent antibiotics. 2022 bloodwork - normal immunoglobulin levels, protective titers for tetanus/diptheria, pneumococcal titers were adequate but not robust. Interim history - no antibiotics since last visit.   Keep track of infections/antibiotics use.

## 2021-08-13 NOTE — Progress Notes (Signed)
Follow Up Note  RE: Suzanne Jarvis MRN: [id number] DOB: Mar 10, 1991 Date of Office Visit: 08/13/2021  Referring provider: Natalia Leatherwood, DO Primary care provider: Natalia Leatherwood, DO  Chief Complaint: Follow-up  History of Present Illness: I had the pleasure of seeing Suzanne Jarvis for a follow up visit at the [facility] on 08/13/2021. She is a 30 y.o. female, who is being followed for allergic rhino conjunctivitis on AIT, drug allergies. Her previous allergy office visit was on 03/24/2021 with Thermon Leyland, FNP. Today is a regular follow up visit.  Possible drug reaction to aspirin Patient is being followed by rheumatology and taking plaquenil. Tolerates ibuprofen with no issues Avoiding aspirin currently and not needing to restart.   Allergic rhino conjunctivitis Noted that her sinuses flare when she is outdoors for long periods of time. Currently on Xyzal and montelukast in the evening, Claritin/allegra in the morning and eye drops daily.  She is on allergy injections and sometimes has a large localized reactions and one episode of breaking out in hives. She did notice some improvement in symptoms.   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   Flonase caused epistaxis and azelastine caused sore throat.  Uses nettipot as needed with good benefit.    Infections No antibiotics since the last visit.  Assessment and Plan: Suzanne Jarvis is a 30 y.o. female with: Seasonal and perennial allergic rhinoconjunctivitis Past history - Rhino conjunctivitis symptoms from the spring through fall for the past 5 years. 2 sinus infections per year. 2021 skin testing showed: Positive to grass, weed, ragweed, trees, cat, mold, dog, dust mites. Borderline to cockroach.  Negative to common foods. Started AIT on 05/20/2020 (G-W-RW-T and M-C-D-CR-DM) Interim history -  Localized reactions  with injections improved. Does not tolerate nasal sprays. Continue allergy injections - given today. If you have a reactions next time let us know  Continue environmental control measures as below. Use over the counter antihistamines such as Zyrtec (cetirizine), Claritin (loratadine), Allegra (fexofenadine), or Xyzal (levocetirizine) daily as needed. May take twice a day during allergy flares. May switch antihistamines every few months. Continue with Singulair (montelukast) 10mg  daily at night. May use olopatadine eye drops 0.1% twice a day as needed for itchy/watery eyes. Nasal saline spray (i.e., Simply Saline) or nasal saline lavage (i.e., NeilMed) is recommended as needed and prior to medicated nasal sprays.  History of frequent upper respiratory infection Past history - Notes frequent sinus infections needing frequent antibiotics. 2022 bloodwork - normal immunoglobulin levels, protective titers for tetanus/diptheria, pneumococcal titers were adequate but not robust. Interim history - no antibiotics since last visit.  Keep track of infections/antibiotics use.  Drug reaction Past history - Broke out in hives after taking penicillin. Adverse reaction to aspirin - most likely non-IgE mediated. Tolerates other NSAIDs. Consider penicillin testing in future - next winter when able to come off antihistamines for 3 days. Continue to avoid aspirin only. Okay to take other NSAIDs.  Return in about 6 months (around 02/10/2022).  Meds ordered this encounter  Medications   levocetirizine (XYZAL) 5 MG tablet    Sig: TAKE 1 TABLET(5 MG) BY MOUTH EVERY EVENING    Dispense:  90 tablet    Refill:  3   montelukast (SINGULAIR) 10 MG tablet    Sig: Take 1 tablet (10 mg total) by mouth at bedtime.  Dispense:  90 tablet    Refill:  3   EPINEPHrine (AUVI-Q) 0.3 mg/0.3 mL IJ SOAJ injection    Sig: Inject 0.3 mg into the muscle as needed for anaphylaxis.    Dispense:  1 each    Refill:  1   Lab Orders   No laboratory test(s) ordered today    Diagnostics: None.  Medication List:  Current Outpatient Medications  Medication Sig Dispense Refill   amphetamine-dextroamphetamine (ADDERALL) 10 MG tablet Take 1 tablet (10 mg total) by mouth 2 (two) times daily. 180 tablet 0   amphetamine-dextroamphetamine (ADDERALL) 10 MG tablet Take 1 tablet (10 mg total) by mouth 2 (two) times daily. 180 tablet 0   ascorbic acid (VITAMIN C) 500 MG tablet Take by mouth.     COLLAGEN PO Take by mouth.     Cyanocobalamin (B-12) 1000 MCG TABS Take 2 tablets by mouth daily.     Diclofenac Sodium CR 100 MG 24 hr tablet Take 1 tablet (100 mg total) by mouth daily. 30 tablet 2   dicyclomine (BENTYL) 10 MG capsule TAKE 1 CAPSULE BY MOUTH FOUR TIMES DAILY BEFORE MEALS AND AT BEDTIME PRN 120 capsule 3   diphenoxylate-atropine (LOMOTIL) 2.5-0.025 MG tablet TAKE 1 TABLET BY MOUTH FOUR TIMES DAILY AS NEEDED FOR DIARRHEA OR LOOSE STOOLS 180 tablet 1   drospirenone-ethinyl estradiol (YAZ,GIANVI,LORYNA) 3-0.02 MG tablet Take 1 tablet by mouth daily.     hydroxychloroquine (PLAQUENIL) 200 MG tablet Take 1 tablet (200 mg total) by mouth daily. 30 tablet 2   Magnesium 100 MG TABS Take by mouth. W/ calcium and zinc     Multiple Vitamins-Minerals (MULTIVITAMIN WITH MINERALS) tablet Take 1 tablet by mouth daily.     nystatin-triamcinolone (MYCOLOG II) cream SMARTSIG:Sparingly Topical 1 to 2 Times Daily PRN     olopatadine (PATANOL) 0.1 % ophthalmic solution Place 1 drop into both eyes 2 (two) times daily as needed (itchy/watery eyes). 5 mL 5   ondansetron (ZOFRAN) 4 MG tablet Take 1 tablet (4 mg total) by mouth every 8 (eight) hours as needed for nausea or vomiting. 20 tablet 0   Vitamin D, Cholecalciferol, 25 MCG (1000 UT) CAPS Take by mouth.     EPINEPHrine (AUVI-Q) 0.3 mg/0.3 mL IJ SOAJ injection Inject 0.3 mg into the muscle as needed for anaphylaxis. 1 each 1   levocetirizine (XYZAL) 5 MG tablet TAKE 1 TABLET(5 MG) BY MOUTH  EVERY EVENING 90 tablet 3   montelukast (SINGULAIR) 10 MG tablet Take 1 tablet (10 mg total) by mouth at bedtime. 90 tablet 3   No current facility-administered medications for this visit.   Allergies: Allergies  Allergen Reactions   Aspirin Anaphylaxis   Amlodipine Other (See Comments)    Per patient burning sensation through body   Amoxicillin Hives   Hyoscyamine Other (See Comments)   Mixed Grasses    Novocain [Procaine] Swelling   Other      grass, weed, ragweed, trees, cat, mold, dog, dust mites. Borderline to cockroach.     Paxil [Paroxetine Hcl]     Dizziness    Penicillins     REACTION: hives   I reviewed her past medical history, social history, family history, and environmental history and no significant changes have been reported from her previous visit.  Review of Systems  Constitutional:  Negative for appetite change, chills, fever and unexpected weight change.  HENT:  Positive for congestion, postnasal drip, rhinorrhea and sneezing.   Eyes:  Positive for itching.  Respiratory:  Negative for cough, chest tightness, shortness of breath and wheezing.   Cardiovascular:  Negative for chest pain.  Gastrointestinal:  Negative for abdominal pain.  Genitourinary:  Negative for difficulty urinating.  Skin:  Negative for rash.  Allergic/Immunologic: Positive for environmental allergies. Negative for food allergies.  Neurological:  Negative for headaches.   Objective: BP 120/82   Pulse 87   Temp 98.8 F (37.1 C) (Temporal)   Resp 18   Ht 5\' 5"  (1.651 m)   Wt 132 lb 4 oz (60 kg)   SpO2 98%   BMI 22.01 kg/m  Body mass index is 22.01 kg/m. Physical Exam Vitals and nursing note reviewed.  Constitutional:      Appearance: Normal appearance. She is well-developed and normal weight.  HENT:     Head: Normocephalic and atraumatic.     Right Ear: Tympanic membrane and external ear normal.     Left Ear: Tympanic membrane and external ear normal.     Nose: Nose normal.      Mouth/Throat:     Mouth: Mucous membranes are moist.     Pharynx: Oropharynx is clear.  Eyes:     Conjunctiva/sclera: Conjunctivae normal.  Cardiovascular:     Rate and Rhythm: Normal rate and regular rhythm.     Heart sounds: Normal heart sounds. No murmur heard.   No friction rub. No gallop.  Pulmonary:     Effort: Pulmonary effort is normal.     Breath sounds: Normal breath sounds. No wheezing, rhonchi or rales.  Abdominal:     Palpations: Abdomen is soft.  Musculoskeletal:     Cervical back: Neck supple.  Skin:    General: Skin is warm.     Findings: No rash.  Neurological:     Mental Status: She is alert and oriented to person, place, and time.  Psychiatric:        Mood and Affect: Mood normal.        Behavior: Behavior normal.   Previous notes and tests were reviewed. The plan was reviewed with the patient/family, and all questions/concerned were addressed.  It was my pleasure to see Suzanne Jarvis today and participate in her care. Please feel free to contact me with any questions or concerns.  Sincerely,  Aggie Cosier, DO Allergy & Immunology  [facility] office: [phone] [facility] office: [phone]

## 2021-08-13 NOTE — Patient Instructions (Addendum)
Environmental allergies 2021 skin testing was Positive to grass, weed, ragweed, trees, cat, mold, dog, dust mites. Borderline to cockroach.  Continue allergy injections - given today. If you have a reactions next time let us know  Continue nvironmental control measures as below. Use over the counter antihistamines such as Zyrtec (cetirizine), Claritin (loratadine), Allegra (fexofenadine), or Xyzal (levocetirizine) daily as needed. May take twice a day during allergy flares. May switch antihistamines every few months. Continue with Singulair (montelukast) 10mg  daily at night. May use olopatadine eye drops 0.1% twice a day as needed for itchy/watery eyes. Nasal saline spray (i.e., Simply Saline) or nasal saline lavage (i.e., NeilMed) is recommended as needed and prior to medicated nasal sprays.  Sinus infections Keep track of infections/antibiotics.  Aspirin Continue to avoid for now.  Penicillin allergy Consider penicillin skin testing/drug challenge in the future - maybe next winter.  More than 90% of patients outgrow their penicillin allergy.   Other Minimize/avoid contact with perm solutions.  Follow up in 6 months or sooner if needed.   Reducing Pollen Exposure Pollen seasons: trees (spring), grass (summer) and ragweed/weeds (fall). Keep windows closed in your home and car to lower pollen exposure.  Install air conditioning in the bedroom and throughout the house if possible.  Avoid going out in dry windy days - especially early morning. Pollen counts are highest between 5 - 10 AM and on dry, hot and windy days.  Save outside activities for late afternoon or after a heavy rain, when pollen levels are lower.  Avoid mowing of grass if you have grass pollen allergy. Be aware that pollen can also be transported indoors on people and pets.  Dry your clothes in an automatic dryer rather than hanging them outside where they might collect pollen.  Rinse hair and eyes before  bedtime.  Pet Allergen Avoidance: Contrary to popular opinion, there are no "hypoallergenic" breeds of dogs or cats. That is because people are not allergic to an animal's hair, but to an allergen found in the animal's saliva, dander (dead skin flakes) or urine. Pet allergy symptoms typically occur within minutes. For some people, symptoms can build up and become most severe 8 to 12 hours after contact with the animal. People with severe allergies can experience reactions in public places if dander has been transported on the pet owners' clothing. Keeping an animal outdoors is only a partial solution, since homes with pets in the yard still have higher concentrations of animal allergens. Before getting a pet, ask your allergist to determine if you are allergic to animals. If your pet is already considered part of your family, try to minimize contact and keep the pet out of the bedroom and other rooms where you spend a great deal of time. As with dust mites, vacuum carpets often or replace carpet with a hardwood floor, tile or linoleum. High-efficiency particulate air (HEPA) cleaners can reduce allergen levels over time. While dander and saliva are the source of cat and dog allergens, urine is the source of allergens from rabbits, hamsters, mice and 1981 pigs; so ask a non-allergic family member to clean the animal's cage. If you have a pet allergy, talk to your allergist about the potential for allergy immunotherapy (allergy shots). This strategy can often provide long-term relief. Mold Control Mold and fungi can grow on a variety of surfaces provided certain temperature and moisture conditions exist.  Outdoor molds grow on plants, decaying vegetation and soil. The major outdoor mold, Alternaria and Cladosporium, are found  in very high numbers during hot and dry conditions. Generally, a late summer - fall peak is seen for common outdoor fungal spores. Rain will temporarily lower outdoor mold spore  count, but counts rise rapidly when the rainy period ends. The most important indoor molds are Aspergillus and Penicillium. Dark, humid and poorly ventilated basements are ideal sites for mold growth. The next most common sites of mold growth are the bathroom and the kitchen. Outdoor (Seasonal) Mold Control Use air conditioning and keep windows closed. Avoid exposure to decaying vegetation. Avoid leaf raking. Avoid grain handling. Consider wearing a face mask if working in moldy areas.  Indoor (Perennial) Mold Control  Maintain humidity below 50%. Get rid of mold growth on hard surfaces with water, detergent and, if necessary, 5% bleach (do not mix with other cleaners). Then dry the area completely. If mold covers an area more than 10 square feet, consider hiring an indoor environmental professional. For clothing, washing with soap and water is best. If moldy items cannot be cleaned and dried, throw them away. Remove sources e.g. contaminated carpets. Repair and seal leaking roofs or pipes. Using dehumidifiers in damp basements may be helpful, but empty the water and clean units regularly to prevent mildew from forming. All rooms, especially basements, bathrooms and kitchens, require ventilation and cleaning to deter mold and mildew growth. Avoid carpeting on concrete or damp floors, and storing items in damp areas. Control of House Dust Mite Allergen Dust mite allergens are a common trigger of allergy and asthma symptoms. While they can be found throughout the house, these microscopic creatures thrive in warm, humid environments such as bedding, upholstered furniture and carpeting. Because so much time is spent in the bedroom, it is essential to reduce mite levels there.  Encase pillows, mattresses, and box springs in special allergen-proof fabric covers or airtight, zippered plastic covers.  Bedding should be washed weekly in hot water (130 F) and dried in a hot dryer. Allergen-proof covers are  available for comforters and pillows that can't be regularly washed.  Wash the allergy-proof covers every few months. Minimize clutter in the bedroom. Keep pets out of the bedroom.  Keep humidity less than 50% by using a dehumidifier or air conditioning. You can buy a humidity measuring device called a hygrometer to monitor this.  If possible, replace carpets with hardwood, linoleum, or washable area rugs. If that's not possible, vacuum frequently with a vacuum that has a HEPA filter. Remove all upholstered furniture and non-washable window drapes from the bedroom. Remove all non-washable stuffed toys from the bedroom.  Wash stuffed toys weekly. Cockroach Allergen Avoidance Cockroaches are often found in the homes of densely populated urban areas, schools or commercial buildings, but these creatures can lurk almost anywhere. This does not mean that you have a dirty house or living area. Block all areas where roaches can enter the home. This includes crevices, wall cracks and windows.  Cockroaches need water to survive, so fix and seal all leaky faucets and pipes. Have an exterminator go through the house when your family and pets are gone to eliminate any remaining roaches. Keep food in lidded containers and put pet food dishes away after your pets are done eating. Vacuum and sweep the floor after meals, and take out garbage and recyclables. Use lidded garbage containers in the kitchen. Wash dishes immediately after use and clean under stoves, refrigerators or toasters where crumbs can accumulate. Wipe off the stove and other kitchen surfaces and cupboards regularly.  Skin  care recommendations  Bath time: Always use lukewarm water. AVOID very hot or cold water. Keep bathing time to 5-10 minutes. Do NOT use bubble bath. Use a mild soap and use just enough to wash the dirty areas. Do NOT scrub skin vigorously.  After bathing, pat dry your skin with a towel. Do NOT rub or scrub the  skin.  Moisturizers and prescriptions:  ALWAYS apply moisturizers immediately after bathing (within 3 minutes). This helps to lock-in moisture. Use the moisturizer several times a day over the whole body. Good summer moisturizers include: Aveeno, CeraVe, Cetaphil. Good winter moisturizers include: Aquaphor, Vaseline, Cerave, Cetaphil, Eucerin, Vanicream. When using moisturizers along with medications, the moisturizer should be applied about one hour after applying the medication to prevent diluting effect of the medication or moisturize around where you applied the medications. When not using medications, the moisturizer can be continued twice daily as maintenance.  Laundry and clothing: Avoid laundry products with added color or perfumes. Use unscented hypo-allergenic laundry products such as Tide free, Cheer free & gentle, and All free and clear.  If the skin still seems dry or sensitive, you can try double-rinsing the clothes. Avoid tight or scratchy clothing such as wool. Do not use fabric softeners or dyer sheets.

## 2021-08-17 ENCOUNTER — Telehealth: Payer: Self-pay | Admitting: Allergy

## 2021-08-17 MED ORDER — EPINEPHRINE 0.3 MG/0.3ML IJ SOAJ: 0.3000 mg | Freq: Once | INTRAMUSCULAR | 1 refills | Status: AC

## 2021-08-17 NOTE — Telephone Encounter (Signed)
Sent in epipen to pts local pharmacy

## 2021-08-17 NOTE — Telephone Encounter (Signed)
Patient called and said that her ins did not cover the Auvi-q and needs something else. [facility] on [address] [phone].

## 2021-08-24 ENCOUNTER — Ambulatory Visit (INDEPENDENT_AMBULATORY_CARE_PROVIDER_SITE_OTHER): Payer: 59

## 2021-08-24 DIAGNOSIS — J309 Allergic rhinitis, unspecified: Secondary | ICD-10-CM

## 2021-09-10 ENCOUNTER — Ambulatory Visit (INDEPENDENT_AMBULATORY_CARE_PROVIDER_SITE_OTHER): Payer: 59

## 2021-09-10 DIAGNOSIS — J309 Allergic rhinitis, unspecified: Secondary | ICD-10-CM

## 2021-09-16 ENCOUNTER — Ambulatory Visit (INDEPENDENT_AMBULATORY_CARE_PROVIDER_SITE_OTHER): Payer: 59 | Admitting: *Deleted

## 2021-09-16 DIAGNOSIS — J309 Allergic rhinitis, unspecified: Secondary | ICD-10-CM

## 2021-09-21 NOTE — Progress Notes (Signed)
Office Visit Note  Patient: Suzanne Jarvis             Date of Birth: 1991-05-13           MRN: [id number]             PCP: [name], DO Referring: [name], DO Visit Date: 09/22/2021   Subjective:  Follow-up (No improvement, gurgling in chest, bil feet, bil legs, right hand numbness and tingling, chronic sinus infection, bil foot pain - sharp, stabbing, difficulty sleeping, burning sensation - bil hands, bil feet, face, bil arms, bil legs)   History of Present Illness: [name] is a 30 y.o. female here for follow up of ongoing hand and feet redness, swelling, and pain possibly erythromelalgia but also associated with positive ANA and raynaud's phenomenon. She started taking hydroxychloroquine 200 mg PO daily about 2 months ago. She has not noticed a large difference after starting this medication, episodes of very painful hand and feet swelling maybe less often. She had some decrease in diarrhea and loose stools. Continues having lots of upper airway symptoms managing this with allergy treatment. She also developed numbness in her right 4th and 5th fingers lasting for a few days she is not sure whether related to her work or not. She also has continuing night sweats waking her many nights after a few hours. No lymphadenopathy she lost weight previously but has been stable now.  Previous HPI 04/14/21 Suzanne Jarvis is a 30 y.o. female here for follow up for ongoing symptoms of suspected erythromelalgia with peripheral hand and feet episodes of redness swelling with pain and paresthesias.  She started taking high-dose aspirin after last visit but developed significant symptoms so follow-up with allergy clinic was suggestive against a true hypersensitivity reaction.  Since her last visit she has noticed ongoing symptoms sometimes daily with episodic red burning painful discoloration in her hands when severe and limiting her ability to drive.  She is also noticed some  episodes of left leg twitching and of right leg distal cramping that occurs spontaneously.  She had 1 more severe episode with her right hand developed severe redness of the first through third digits and pallor in the fourth and fifth digit she never previously noticed both discoloration juxtaposed in previous episodes.  02/24/21 [name] is a 30 y.o. female here for evaluation of positive ANA associated with raynaud's phenomenon redness in the hands and feet. She has longstanding raynaud's symptoms with episodic pallor and cyanosis in her fingers. However during the past approximately 6 months she experieces episodes of redness and swelling in her hands and feet with hot skin temperature and a burning pain sensation. This lasts for minutes to a few hours per episode. She has not noticed specific provocative stimuli. These episodes are not preceded by raynaud's symptoms and are not provoked with the cold exposure. She has noticed it more frequently over time. Besides these episodes she also has had some oral ulcers that came and went. Redness and burning developed on the face but less frequently. She has chronic, longstanding and severe allergic rhinitis type symptoms with persistent sinus infections.   ANA 1:80 homogenous Reflex ENA panel neg IgA 387 Eos 5.4  Review of Systems  Constitutional:  Positive for fatigue.  HENT:  Positive for mouth dryness.   Eyes:  Positive for dryness.  Respiratory:  Negative for shortness of breath.   Cardiovascular:  Positive for swelling in legs/feet.  Gastrointestinal:  Positive for constipation and diarrhea.  Endocrine: Positive for cold intolerance and heat intolerance.  Genitourinary:  Negative for difficulty urinating.  Musculoskeletal:  Positive for gait problem.  Skin:  Negative for rash.  Allergic/Immunologic: Positive for susceptible to infections.  Neurological:  Positive for numbness.  Hematological:  Positive for bruising/bleeding tendency.   Psychiatric/Behavioral:  Positive for sleep disturbance.    PMFS History:  Patient Active Problem List   Diagnosis Date Noted   Metatarsalgia of right foot 07/07/2021   Toe pain, chronic, right 07/07/2021   Bilateral hand numbness 04/14/2021   Drug reaction 03/11/2021   Positive ANA (antinuclear antibody) 02/24/2021   History of frequent upper respiratory infection 01/27/2021   Seasonal allergies 01/14/2021   Rash and other nonspecific skin eruption 12/30/2020   Seasonal and perennial allergic rhinoconjunctivitis 07/28/2020   Concussion with no loss of consciousness 07/08/2020   ADD (attention deficit disorder) 07/08/2020   Penicillin allergy 04/28/2020   IBS (irritable bowel syndrome) 09/30/2015   Vitamin B 12 deficiency 12/10/2014    Past Medical History:  Diagnosis Date   ADD (attention deficit disorder) 07/08/2020   Angio-edema    ASCUS with positive high risk HPV cervical 03/26/2019   01/2019; negative colposcopy 03/2019; plan repeat pap and HPV test 01/2020   Asthma    Chronic headaches    Concussion with no loss of consciousness 07/08/2020   May 2021   Eczema    IBS (irritable bowel syndrome)    Penicillin allergy 04/28/2020   Seasonal and perennial allergic rhinoconjunctivitis 07/28/2020   Tonsillitis    Urticaria     Family History  Problem Relation Age of Onset   Allergies Mother    Asthma Mother    Hypertension Mother    Allergic rhinitis Mother    Urticaria Mother    Arthritis Maternal Grandmother    Irritable bowel syndrome Maternal Grandmother    Breast cancer Maternal Grandmother    Allergic rhinitis Sister    Eczema Sister    Anemia Sister    Hashimoto's thyroiditis Brother    ADD / ADHD Brother    Lung cancer Other        paternal great grandfather   Lung cancer Other        maternal great grandfather   Cancer Other        Blood-great grandmother   Lupus Cousin    Hashimoto's thyroiditis Cousin    Colon cancer Neg Hx    Past Surgical History:   Procedure Laterality Date   WISDOM TOOTH EXTRACTION     Social History   Social History Narrative   Not on file   Immunization History  Administered Date(s) Administered   Influenza Split 11/18/2011, 11/26/2012   Influenza,inj,Quad PF,6+ Mos 08/08/2013, 09/05/2014, 08/14/2015, 08/24/2016, 08/26/2017, 08/29/2018, 09/18/2019, 09/09/2020   PFIZER(Purple Top)SARS-COV-2 Vaccination 03/31/2020, 04/21/2020, 11/10/2020   Td 06/16/2008   Tdap 01/29/2020     Objective: Vital Signs: BP 116/66 (BP Location: Left Arm, Patient Position: Sitting, Cuff Size: Normal)   Pulse 68   Resp 14   Ht _0  (1.651 m)   Wt 130 lb (59 kg)   BMI 21.63 kg/m    Physical Exam Cardiovascular:     Rate and Rhythm: Normal rate and regular rhythm.  Pulmonary:     Effort: Pulmonary effort is normal.     Breath sounds: Normal breath sounds.  Musculoskeletal:     Right lower leg: No edema.     Left lower leg: No edema.  Skin:  Comments: Some nailfold capillary dropout and few tortuous vessels  Neurological:     General: No focal deficit present.  Psychiatric:        Mood and Affect: Mood normal.     Musculoskeletal Exam:  Elbows full ROM no tenderness or swelling Wrists full ROM no tenderness or swelling Fingers full ROM no tenderness or swelling Knees full ROM no tenderness or swelling    Investigation: No additional findings.  Imaging: No results found.  Recent Labs: Lab Results  Component Value Date   WBC 4.5 09/22/2021   HGB 13.7 09/22/2021   PLT 199 09/22/2021   NA 136 01/13/2021   K 3.7 01/13/2021   CL 102 01/13/2021   CO2 26 01/13/2021   GLUCOSE 85 01/13/2021   BUN 9 01/13/2021   CREATININE 0.76 01/13/2021   BILITOT 0.5 01/13/2021   ALKPHOS 39 01/13/2021   AST 17 01/13/2021   ALT 13 01/13/2021   PROT 7.4 02/24/2021   ALBUMIN 4.2 01/13/2021   CALCIUM 9.5 01/13/2021   GFRAA >60 09/24/2018    Speciality Comments: No specialty comments available.  Procedures:  No  procedures performed Allergies: Aspirin, Amlodipine, Amoxicillin, Hyoscyamine, Mixed grasses, Novocain [procaine], Other, Paxil [paroxetine hcl], and Penicillins   Assessment / Plan:     Visit Diagnoses: Rash and other nonspecific skin eruption  Positive ANA (antinuclear antibody) - Plan: CBC with Differential/Platelet, Sedimentation rate, C3 and C4  Ongoing symptoms remain atypical for systemic inflammatory disease but also unusual for chronic allergic disease. Will repeat CBC, ESR, and complements for evidence of disease activity. She does report some partial improvement since starting hydroxychloroquine so will continue 200 mg PO daily.  Bilateral hand numbness - Plan: CBC with Differential/Platelet, Sedimentation rate, C3 and C4  Symptoms are not typical for carpal tunnel syndrome, no specific dermatomal distribution. She had a normal NCS but may benefit with neurology evaluation as a next step if nothing new on lab workup today.  Orders: Orders Placed This Encounter  Procedures   CBC with Differential/Platelet   Sedimentation rate   C3 and C4    No orders of the defined types were placed in this encounter.    Follow-Up Instructions: No follow-ups on file.   Collier Salina, MD  Note - This record has been created using [employer].  Chart creation errors have been sought, but may not always  have been located. Such creation errors do not reflect on  the standard of medical care.

## 2021-09-22 ENCOUNTER — Encounter: Payer: Self-pay | Admitting: Internal Medicine

## 2021-09-22 ENCOUNTER — Ambulatory Visit: Payer: 59 | Admitting: Internal Medicine

## 2021-09-22 ENCOUNTER — Other Ambulatory Visit: Payer: Self-pay

## 2021-09-22 VITALS — BP 116/66 | HR 68 | Resp 14 | Ht 65.0 in | Wt 130.0 lb

## 2021-09-22 DIAGNOSIS — R21 Rash and other nonspecific skin eruption: Secondary | ICD-10-CM | POA: Diagnosis not present

## 2021-09-22 DIAGNOSIS — K580 Irritable bowel syndrome with diarrhea: Secondary | ICD-10-CM | POA: Diagnosis not present

## 2021-09-22 DIAGNOSIS — R200 Anesthesia of skin: Secondary | ICD-10-CM

## 2021-09-22 DIAGNOSIS — R768 Other specified abnormal immunological findings in serum: Secondary | ICD-10-CM | POA: Diagnosis not present

## 2021-09-23 LAB — CBC WITH DIFFERENTIAL/PLATELET
Absolute Monocytes: 360 cells/uL (ref 200–950)
Basophils Absolute: 41 cells/uL (ref 0–200)
Basophils Relative: 0.9 %
Eosinophils Absolute: 221 cells/uL (ref 15–500)
Eosinophils Relative: 4.9 %
HCT: 39.1 % (ref 35.0–45.0)
Hemoglobin: 13.7 g/dL (ref 11.7–15.5)
Lymphs Abs: 1602 cells/uL (ref 850–3900)
MCH: 32.4 pg (ref 27.0–33.0)
MCHC: 35 g/dL (ref 32.0–36.0)
MCV: 92.4 fL (ref 80.0–100.0)
MPV: 10.8 fL (ref 7.5–12.5)
Monocytes Relative: 8 %
Neutro Abs: 2277 cells/uL (ref 1500–7800)
Neutrophils Relative %: 50.6 %
Platelets: 199 10*3/uL (ref 140–400)
RBC: 4.23 10*6/uL (ref 3.80–5.10)
RDW: 11.8 % (ref 11.0–15.0)
Total Lymphocyte: 35.6 %
WBC: 4.5 10*3/uL (ref 3.8–10.8)

## 2021-09-23 LAB — C3 AND C4
C3 Complement: 121 mg/dL (ref 83–193)
C4 Complement: 24 mg/dL (ref 15–57)

## 2021-09-23 LAB — SEDIMENTATION RATE: Sed Rate: 2 mm/h (ref 0–20)

## 2021-09-27 ENCOUNTER — Telehealth: Payer: Self-pay | Admitting: Family Medicine

## 2021-09-27 NOTE — Telephone Encounter (Signed)
If she is already in a 30-minute slot, then there is no additional time we can add to the slots. We will cover as much as we can for her though.

## 2021-09-27 NOTE — Telephone Encounter (Signed)
Pt called asking for extra time for appt 11/16. She is wanting to be seen for routine issues & add time for acute. She said she has red patches in her mouth and that same issue happened 10/16/20 & went to [facility] in [place].  I advised it is not typical to combine the appt. Pt requesting call back.   Ph# [phone]

## 2021-09-27 NOTE — Telephone Encounter (Signed)
Pt aware of provider's instructions.

## 2021-09-27 NOTE — Telephone Encounter (Signed)
Please advise?  You already have a 10 o'clock.

## 2021-09-29 ENCOUNTER — Encounter: Payer: Self-pay | Admitting: Family Medicine

## 2021-09-29 ENCOUNTER — Ambulatory Visit (INDEPENDENT_AMBULATORY_CARE_PROVIDER_SITE_OTHER): Payer: 59 | Admitting: Family Medicine

## 2021-09-29 ENCOUNTER — Other Ambulatory Visit: Payer: Self-pay

## 2021-09-29 VITALS — BP 115/72 | HR 99 | Temp 98.9°F | Ht 65.0 in | Wt 130.0 lb

## 2021-09-29 DIAGNOSIS — K120 Recurrent oral aphthae: Secondary | ICD-10-CM

## 2021-09-29 DIAGNOSIS — K580 Irritable bowel syndrome with diarrhea: Secondary | ICD-10-CM | POA: Diagnosis not present

## 2021-09-29 DIAGNOSIS — R200 Anesthesia of skin: Secondary | ICD-10-CM

## 2021-09-29 DIAGNOSIS — R202 Paresthesia of skin: Secondary | ICD-10-CM

## 2021-09-29 DIAGNOSIS — G8929 Other chronic pain: Secondary | ICD-10-CM

## 2021-09-29 DIAGNOSIS — M79674 Pain in right toe(s): Secondary | ICD-10-CM

## 2021-09-29 DIAGNOSIS — M7741 Metatarsalgia, right foot: Secondary | ICD-10-CM

## 2021-09-29 DIAGNOSIS — F988 Other specified behavioral and emotional disorders with onset usually occurring in childhood and adolescence: Secondary | ICD-10-CM

## 2021-09-29 DIAGNOSIS — R768 Other specified abnormal immunological findings in serum: Secondary | ICD-10-CM | POA: Diagnosis not present

## 2021-09-29 MED ORDER — METHYLPREDNISOLONE ACETATE 80 MG/ML IJ SUSP
80.0000 mg | Freq: Once | INTRAMUSCULAR | Status: AC
  Administered 2021-09-29: 80 mg via INTRAMUSCULAR

## 2021-09-29 MED ORDER — AMPHETAMINE-DEXTROAMPHETAMINE 10 MG PO TABS: 10.0000 mg | ORAL_TABLET | Freq: Two times a day (BID) | ORAL | 0 refills | Status: DC

## 2021-09-29 MED ORDER — AMPHETAMINE-DEXTROAMPHETAMINE 10 MG PO TABS
10.0000 mg | ORAL_TABLET | Freq: Two times a day (BID) | ORAL | 0 refills | Status: DC
Start: 2021-09-29 — End: 2021-12-08

## 2021-09-29 MED ORDER — DIPHENOXYLATE-ATROPINE 2.5-0.025 MG PO TABS: ORAL_TABLET | ORAL | 1 refills | Status: DC

## 2021-09-29 MED ORDER — DICLOFENAC SODIUM ER 100 MG PO TB24
100.0000 mg | ORAL_TABLET | Freq: Every day | ORAL | 2 refills | Status: DC
Start: 2021-09-29 — End: 2021-12-08

## 2021-09-29 MED ORDER — DICYCLOMINE HCL 10 MG PO CAPS: ORAL_CAPSULE | ORAL | 3 refills | Status: DC

## 2021-09-29 NOTE — Progress Notes (Signed)
This visit occurred during the SARS-CoV-2 public health emergency.  Safety protocols were in place, including screening questions prior to the visit, additional usage of staff PPE, and extensive cleaning of exam room while observing appropriate contact time as indicated for disinfecting solutions.    Suzanne Jarvis , 01/01/1991, 30 y.o., female MRN: [id number] Patient Care Team    Relationship Specialty Notifications Start End  Natalia Leatherwood, DO PCP - General Family Medicine  09/04/15   Rodell Perna, NP (Inactive) Nurse Practitioner Obstetrics and Gynecology  01/29/20     Chief Complaint  Patient presents with   ADHD    [facility];    Mouth Lesions    Pt c/o "red patches in my mouth", pain and mouth lesions , x 5 days;      Subjective:Suzanne Jarvis is a 30 y.o.  pt present for an OV for chronic medical conditions  Attention deficit:  pt present to follow-up with her attention deficit status post concussion.  Patient reports  Adderall to 10 mg twice daily is working very well for her.  At this dose she is able to remain productive s/p concussion.  Prior note: Status post concussion and sequela of insomnia and focus deficits.  She has been evaluated and followed by sports medicine for this condition.  Her condition is felt to be stable and is now referred back to primary care to take over management.  Patient reports she is sleeping well on trazodone 50 mg nightly.  She also feels her focus is much improved on Adderall 20 mg XR.  She is uncertain if she needs this high of a dose.  She also would like to hopefully not have to stay on trazodone or Adderall long-term.  She does admit they are both helpful with her condition.    Irritable bowel syndrome with diarrhea Patient reports her irritable bowel syndrome is very well controlled on Lomotil and Bentyl as needed.  She only takes this medication when she is going to be away from home and consuming meals.    Mouth lesions: Patient  reports approximately 4 days ago having noticed red mouth lesions in the back of her throat.  She states they are not painful.  She is tolerating p.o.  She just happened to notice them and had concerns wondering if it was from her Plaquenil medication.  She also noticed an ulceration on her tongue on the right posterior aspect of tongue.  It is uncomfortable mostly just because it pushes up against her tooth.  She denies any fevers or chills, she denies recent illness.  She states she always has sinus issues, so sometimes it is difficult for her to tell if she has had a mild sinus or head cold.   Depression screen [facility] 2/9 09/29/2021 10/14/2020 01/29/2020 11/20/2019  Decreased Interest 0 0 0 0  Down, Depressed, Hopeless 0 0 0 0  PHQ - 2 Score 0 0 0 0    Allergies  Allergen Reactions   Aspirin Anaphylaxis   Amlodipine Other (See Comments)    Per patient burning sensation through body   Amoxicillin Hives   Hyoscyamine Other (See Comments)   Mixed Grasses    Novocain [Procaine] Swelling   Other      grass, weed, ragweed, trees, cat, mold, dog, dust mites. Borderline to cockroach.     Paxil [Paroxetine Hcl]     Dizziness    Penicillins     REACTION: hives   Social History  Social History Narrative   Not on file   Past Medical History:  Diagnosis Date   ADD (attention deficit disorder) 07/08/2020   Angio-edema    ASCUS with positive high risk HPV cervical 03/26/2019   01/2019; negative colposcopy 03/2019; plan repeat pap and HPV test 01/2020   Asthma    Chronic headaches    Concussion with no loss of consciousness 07/08/2020   May 2021   Eczema    IBS (irritable bowel syndrome)    Penicillin allergy 04/28/2020   Seasonal and perennial allergic rhinoconjunctivitis 07/28/2020   Tonsillitis    Urticaria    Past Surgical History:  Procedure Laterality Date   WISDOM TOOTH EXTRACTION     Family History  Problem Relation Age of Onset   Allergies Mother    Asthma Mother    Hypertension  Mother    Allergic rhinitis Mother    Urticaria Mother    Arthritis Maternal Grandmother    Irritable bowel syndrome Maternal Grandmother    Breast cancer Maternal Grandmother    Allergic rhinitis Sister    Eczema Sister    Anemia Sister    Hashimoto's thyroiditis Brother    ADD / ADHD Brother    Lung cancer Other        paternal great grandfather   Lung cancer Other        maternal great grandfather   Cancer Other        Blood-great grandmother   Lupus Cousin    Hashimoto's thyroiditis Cousin    Colon cancer Neg Hx    Allergies as of 09/29/2021       Reactions   Aspirin Anaphylaxis   Amlodipine Other (See Comments)   Per patient burning sensation through body   Amoxicillin Hives   Hyoscyamine Other (See Comments)   Mixed Grasses    Novocain [procaine] Swelling   Other     grass, weed, ragweed, trees, cat, mold, dog, dust mites. Borderline to cockroach.     Paxil [paroxetine Hcl]    Dizziness   Penicillins    REACTION: hives        Medication List        Accurate as of September 29, 2021  1:04 PM. If you have any questions, ask your nurse or doctor.          STOP taking these medications    COLLAGEN PO Stopped by: Felix Pacini, DO       TAKE these medications    amphetamine-dextroamphetamine 10 MG tablet Commonly known as: Adderall Take 1 tablet (10 mg total) by mouth 2 (two) times daily.   amphetamine-dextroamphetamine 10 MG tablet Commonly known as: ADDERALL Take 1 tablet (10 mg total) by mouth 2 (two) times daily.   ascorbic acid 500 MG tablet Commonly known as: VITAMIN C Take by mouth.   B-12 1000 MCG Tabs Take 2 tablets by mouth daily.   Diclofenac Sodium CR 100 MG 24 hr tablet Take 1 tablet (100 mg total) by mouth daily.   dicyclomine 10 MG capsule Commonly known as: BENTYL TAKE 1 CAPSULE BY MOUTH FOUR TIMES DAILY BEFORE MEALS AND AT BEDTIME PRN   diphenoxylate-atropine 2.5-0.025 MG tablet Commonly known as: LOMOTIL TAKE 1  TABLET BY MOUTH FOUR TIMES DAILY AS NEEDED FOR DIARRHEA OR LOOSE STOOLS   drospirenone-ethinyl estradiol 3-0.02 MG tablet Commonly known as: YAZ Take 1 tablet by mouth daily.   EPINEPHrine 0.3 mg/0.3 mL Soaj injection Commonly known as: Auvi-Q Inject 0.3 mg into the muscle  as needed for anaphylaxis.   hydroxychloroquine 200 MG tablet Commonly known as: PLAQUENIL Take 1 tablet (200 mg total) by mouth daily.   levocetirizine 5 MG tablet Commonly known as: XYZAL TAKE 1 TABLET(5 MG) BY MOUTH EVERY EVENING   Magnesium 100 MG Tabs Take by mouth. W/ calcium and zinc   montelukast 10 MG tablet Commonly known as: SINGULAIR Take 1 tablet (10 mg total) by mouth at bedtime.   multivitamin with minerals tablet Take 1 tablet by mouth daily.   nystatin-triamcinolone cream Commonly known as: MYCOLOG II SMARTSIG:Sparingly Topical 1 to 2 Times Daily PRN   olopatadine 0.1 % ophthalmic solution Commonly known as: Patanol Place 1 drop into both eyes 2 (two) times daily as needed (itchy/watery eyes).   ondansetron 4 MG tablet Commonly known as: ZOFRAN Take 1 tablet (4 mg total) by mouth every 8 (eight) hours as needed for nausea or vomiting.   Vitamin D (Cholecalciferol) 25 MCG (1000 UT) Caps Take by mouth.        All past medical history, surgical history, allergies, family history, immunizations andmedications were updated in the EMR today and reviewed under the history and medication portions of their EMR.     ROS: Negative, with the exception of above mentioned in HPI   Objective:  BP 115/72   Pulse 99   Temp 98.9 F (37.2 C) (Oral)   Ht 5\' 5"  (1.651 m)   Wt 130 lb (59 kg)   LMP 08/28/2021   SpO2 100%   BMI 21.63 kg/m  Body mass index is 21.63 kg/m. Gen: Afebrile. No acute distress.  HENT: AT. [place].  X2 small red raised lesions posterior pharynx.  X1 small ulceration right lateral tongue near last molar.  No erythema, no exudates or drainage.  No cough.  No hoarseness.   Bilateral TMs appreciated with moderate bilateral effusions present.  EAC normal. Eyes:Pupils Equal Round Reactive to light, Extraocular movements intact,  Conjunctiva without redness, discharge or icterus. CV: RRR Chest: CTAB Neuro:  Normal gait. PERLA. EOMi. Alert. Oriented x3 Psych: Normal affect, dress and demeanor. Normal speech. Normal thought content and judgment    No results found. No results found. No results found for this or any previous visit (from the past 24 hour(s)).  Assessment/Plan: CHANNA STINNER is a 30 y.o. female present for OV for  Attention deficit disorder (ADD) without hyperactivity/Controlled substance agreement signed/history of concussion with focus deficit Stable Continue Adderall to 10 mg twice dail (#90 d scripts x2 provided) [place] controlled substance database reviewed 09/29/21 Controlled substance agreement signed  UDS completed Follow-up 6 months  Irritable bowel syndrome with diarrhea Stable. Continue Lomotil as needed Continue Bentyl as needed  Seasonal allergies Following with allergist.   Extremity swelling and pain/cold extremities: Following with rheumatology.  However, most recent visit with rheumatology they suggested possible referral to neurology since she is not seeing great improvement with treatment. Referral to neurology placed for her today  Aphthous ulceration of mouth With her exam being consistent with possible recent viral syndrome, suspect these could be from recent viral illness. Considered part of autoimmune disorder, but not classic appearance for Behcet's ulcers. Since she has the changes that suggest recent viral illness with sinuses, ears, but does not appear infectious> IM Depo-Medrol injection provided today. She will continue to monitor ulcerations and if they worsen or become uncomfortable she will be seen for further management.  Reviewed expectations re: course of current medical issues. Discussed  self-management of symptoms. Outlined  signs and symptoms indicating need for more acute intervention. Patient verbalized understanding and all questions were answered. Patient received an After-Visit Summary.    Orders Placed This Encounter  Procedures   Ambulatory referral to Neurology    Meds ordered this encounter  Medications   dicyclomine (BENTYL) 10 MG capsule    Sig: TAKE 1 CAPSULE BY MOUTH FOUR TIMES DAILY BEFORE MEALS AND AT BEDTIME PRN    Dispense:  120 capsule    Refill:  3    Prn med- please only fill if pt request   Diclofenac Sodium CR 100 MG 24 hr tablet    Sig: Take 1 tablet (100 mg total) by mouth daily.    Dispense:  30 tablet    Refill:  2   amphetamine-dextroamphetamine (ADDERALL) 10 MG tablet    Sig: Take 1 tablet (10 mg total) by mouth 2 (two) times daily.    Dispense:  180 tablet    Refill:  0   amphetamine-dextroamphetamine (ADDERALL) 10 MG tablet    Sig: Take 1 tablet (10 mg total) by mouth 2 (two) times daily.    Dispense:  180 tablet    Refill:  0    May refill ~90 days after date written   diphenoxylate-atropine (LOMOTIL) 2.5-0.025 MG tablet    Sig: TAKE 1 TABLET BY MOUTH FOUR TIMES DAILY AS NEEDED FOR DIARRHEA OR LOOSE STOOLS    Dispense:  180 tablet    Refill:  1   methylPREDNISolone acetate (DEPO-MEDROL) injection 80 mg    Referral Orders         Ambulatory referral to Neurology       Note is dictated utilizing voice recognition software. Although note has been proof read prior to signing, occasional typographical errors still can be missed. If any questions arise, please do not hesitate to call for verification.   electronically signed by:  Felix Pacini, DO  [facility] [place]

## 2021-09-29 NOTE — Patient Instructions (Signed)
Great to see you today.  I have refilled the medication(s) we provide.   If labs were collected, we will inform you of lab results once received either by echart message or telephone call.   - echart message- for normal results that have been seen by the patient already.   - telephone call: abnormal results or if patient has not viewed results in their echart.  

## 2021-10-01 ENCOUNTER — Encounter: Payer: Self-pay | Admitting: Neurology

## 2021-10-06 ENCOUNTER — Ambulatory Visit (INDEPENDENT_AMBULATORY_CARE_PROVIDER_SITE_OTHER): Payer: 59

## 2021-10-06 DIAGNOSIS — J309 Allergic rhinitis, unspecified: Secondary | ICD-10-CM

## 2021-10-11 ENCOUNTER — Encounter: Payer: Self-pay | Admitting: Internal Medicine

## 2021-10-12 DIAGNOSIS — J3089 Other allergic rhinitis: Secondary | ICD-10-CM | POA: Diagnosis not present

## 2021-10-13 NOTE — Progress Notes (Signed)
VIALS MADE. EXP 10-13-22 

## 2021-11-02 ENCOUNTER — Ambulatory Visit (INDEPENDENT_AMBULATORY_CARE_PROVIDER_SITE_OTHER): Payer: 59 | Admitting: *Deleted

## 2021-11-02 DIAGNOSIS — J309 Allergic rhinitis, unspecified: Secondary | ICD-10-CM | POA: Diagnosis not present

## 2021-11-11 ENCOUNTER — Ambulatory Visit (INDEPENDENT_AMBULATORY_CARE_PROVIDER_SITE_OTHER): Payer: 59 | Admitting: *Deleted

## 2021-11-11 DIAGNOSIS — J309 Allergic rhinitis, unspecified: Secondary | ICD-10-CM | POA: Diagnosis not present

## 2021-11-16 ENCOUNTER — Ambulatory Visit (INDEPENDENT_AMBULATORY_CARE_PROVIDER_SITE_OTHER): Payer: Self-pay | Admitting: *Deleted

## 2021-11-16 DIAGNOSIS — J309 Allergic rhinitis, unspecified: Secondary | ICD-10-CM

## 2021-11-25 ENCOUNTER — Ambulatory Visit (INDEPENDENT_AMBULATORY_CARE_PROVIDER_SITE_OTHER): Payer: 59

## 2021-11-25 DIAGNOSIS — J309 Allergic rhinitis, unspecified: Secondary | ICD-10-CM

## 2021-12-03 ENCOUNTER — Ambulatory Visit (INDEPENDENT_AMBULATORY_CARE_PROVIDER_SITE_OTHER): Payer: 59

## 2021-12-03 DIAGNOSIS — J309 Allergic rhinitis, unspecified: Secondary | ICD-10-CM

## 2021-12-08 ENCOUNTER — Ambulatory Visit (INDEPENDENT_AMBULATORY_CARE_PROVIDER_SITE_OTHER): Payer: 59 | Admitting: Family Medicine

## 2021-12-08 ENCOUNTER — Encounter: Payer: Self-pay | Admitting: Family Medicine

## 2021-12-08 ENCOUNTER — Other Ambulatory Visit: Payer: Self-pay

## 2021-12-08 DIAGNOSIS — F988 Other specified behavioral and emotional disorders with onset usually occurring in childhood and adolescence: Secondary | ICD-10-CM | POA: Diagnosis not present

## 2021-12-08 DIAGNOSIS — K580 Irritable bowel syndrome with diarrhea: Secondary | ICD-10-CM | POA: Diagnosis not present

## 2021-12-08 DIAGNOSIS — R69 Illness, unspecified: Secondary | ICD-10-CM | POA: Diagnosis not present

## 2021-12-08 MED ORDER — AMPHETAMINE-DEXTROAMPHETAMINE 10 MG PO TABS: 10.0000 mg | ORAL_TABLET | Freq: Two times a day (BID) | ORAL | 0 refills | Status: DC

## 2021-12-08 MED ORDER — DICYCLOMINE HCL 10 MG PO CAPS: ORAL_CAPSULE | ORAL | 3 refills | Status: DC

## 2021-12-08 MED ORDER — DIPHENOXYLATE-ATROPINE 2.5-0.025 MG PO TABS: ORAL_TABLET | ORAL | 1 refills | Status: DC

## 2021-12-08 NOTE — Progress Notes (Signed)
This visit occurred during the SARS-CoV-2 public health emergency.  Safety protocols were in place, including screening questions prior to the visit, additional usage of staff PPE, and extensive cleaning of exam room while observing appropriate contact time as indicated for disinfecting solutions.    Suzanne Jarvis , 09/30/1991, 31 y.o., female MRN: [id number] Patient Care Team    Relationship Specialty Notifications Start End  [name], DO PCP - General Family Medicine  09/04/15   [name], NP (Inactive) Nurse Practitioner Obstetrics and Gynecology  01/29/20     Chief Complaint  Patient presents with   ADHD    Pt will like to transfer pharmacies; pt is not fasting     Subjective:Suzanne Jarvis is a 31 y.o.  pt present for an OV for chronic medical conditions  Attention deficit:  pt present to follow-up with her attention deficit status post concussion.  Patient reports  compliance Adderall to 10 mg twice daily is working very well for her.  At this dose she is able to remain productive s/p concussion.  Prior note: Status post concussion and sequela of insomnia and focus deficits.  She has been evaluated and followed by sports medicine for this condition.  Her condition is felt to be stable and is now referred back to primary care to take over management.  Patient reports she is sleeping well on trazodone 50 mg nightly.  She also feels her focus is much improved on Adderall 20 mg XR.  She is uncertain if she needs this high of a dose.  She also would like to hopefully not have to stay on trazodone or Adderall long-term.  She does admit they are both helpful with her condition.    Irritable bowel syndrome with diarrhea Patient reports her irritable bowel syndrome is very well controlled on Lomotil and Bentyl as needed.  She only takes this medication when she is going to be away from home and consuming meals.    Depression screen [facility] 2/9 09/29/2021 10/14/2020 01/29/2020  11/20/2019  Decreased Interest 0 0 0 0  Down, Depressed, Hopeless 0 0 0 0  PHQ - 2 Score 0 0 0 0    Allergies  Allergen Reactions   Aspirin Anaphylaxis   Amlodipine Other (See Comments)    Per patient burning sensation through body   Amoxicillin Hives   Hyoscyamine Other (See Comments)   Mixed Grasses    Novocain [Procaine] Swelling   Other      grass, weed, ragweed, trees, cat, mold, dog, dust mites. Borderline to cockroach.     Paxil [Paroxetine Hcl]     Dizziness    Penicillins     REACTION: hives   Social History   Social History Narrative   Not on file   Past Medical History:  Diagnosis Date   ADD (attention deficit disorder) 07/08/2020   Angio-edema    ASCUS with positive high risk HPV cervical 03/26/2019   01/2019; negative colposcopy 03/2019; plan repeat pap and HPV test 01/2020   Asthma    Chronic headaches    Concussion with no loss of consciousness 07/08/2020   May 2021   Eczema    IBS (irritable bowel syndrome)    Penicillin allergy 04/28/2020   Seasonal and perennial allergic rhinoconjunctivitis 07/28/2020   Tonsillitis    Urticaria    Past Surgical History:  Procedure Laterality Date   WISDOM TOOTH EXTRACTION     Family History  Problem Relation Age of Onset  Allergies Mother    Asthma Mother    Hypertension Mother    Allergic rhinitis Mother    Urticaria Mother    Allergic rhinitis Sister    Eczema Sister    Anemia Sister    Hashimoto's thyroiditis Brother    ADD / ADHD Brother    Arthritis Maternal Grandmother    Irritable bowel syndrome Maternal Grandmother    Breast cancer Maternal Grandmother    Lupus Cousin    Hashimoto's thyroiditis Cousin    Lung cancer Other        paternal great grandfather   Lung cancer Other        maternal great grandfather   Pancreatic cancer Other        great aunt   Colon cancer Neg Hx    Allergies as of 12/08/2021       Reactions   Aspirin Anaphylaxis   Amlodipine Other (See Comments)   Per patient  burning sensation through body   Amoxicillin Hives   Hyoscyamine Other (See Comments)   Mixed Grasses    Novocain [procaine] Swelling   Other     grass, weed, ragweed, trees, cat, mold, dog, dust mites. Borderline to cockroach.     Paxil [paroxetine Hcl]    Dizziness   Penicillins    REACTION: hives        Medication List        Accurate as of December 08, 2021  8:46 AM. If you have any questions, ask your nurse or doctor.          STOP taking these medications    Diclofenac Sodium CR 100 MG 24 hr tablet Stopped by: Howard Pouch, DO   hydroxychloroquine 200 MG tablet Commonly known as: PLAQUENIL Stopped by: Howard Pouch, DO       TAKE these medications    amphetamine-dextroamphetamine 10 MG tablet Commonly known as: Adderall Take 1 tablet (10 mg total) by mouth 2 (two) times daily.   amphetamine-dextroamphetamine 10 MG tablet Commonly known as: ADDERALL Take 1 tablet (10 mg total) by mouth 2 (two) times daily.   ascorbic acid 500 MG tablet Commonly known as: VITAMIN C Take by mouth.   B-12 1000 MCG Tabs Take 2 tablets by mouth daily.   dicyclomine 10 MG capsule Commonly known as: BENTYL TAKE 1 CAPSULE BY MOUTH FOUR TIMES DAILY BEFORE MEALS AND AT BEDTIME PRN   diphenoxylate-atropine 2.5-0.025 MG tablet Commonly known as: LOMOTIL TAKE 1 TABLET BY MOUTH FOUR TIMES DAILY AS NEEDED FOR DIARRHEA OR LOOSE STOOLS   drospirenone-ethinyl estradiol 3-0.02 MG tablet Commonly known as: YAZ Take 1 tablet by mouth daily.   EPINEPHrine 0.3 mg/0.3 mL Soaj injection Commonly known as: Auvi-Q Inject 0.3 mg into the muscle as needed for anaphylaxis.   levocetirizine 5 MG tablet Commonly known as: XYZAL TAKE 1 TABLET(5 MG) BY MOUTH EVERY EVENING   Magnesium 100 MG Tabs Take by mouth. W/ calcium and zinc   montelukast 10 MG tablet Commonly known as: SINGULAIR Take 1 tablet (10 mg total) by mouth at bedtime.   multivitamin with minerals tablet Take 1 tablet  by mouth daily.   nystatin-triamcinolone cream Commonly known as: MYCOLOG II SMARTSIG:Sparingly Topical 1 to 2 Times Daily PRN   olopatadine 0.1 % ophthalmic solution Commonly known as: Patanol Place 1 drop into both eyes 2 (two) times daily as needed (itchy/watery eyes).   ondansetron 4 MG tablet Commonly known as: ZOFRAN Take 1 tablet (4 mg total) by mouth every 8 (  eight) hours as needed for nausea or vomiting.   Vitamin D (Cholecalciferol) 25 MCG (1000 UT) Caps Take by mouth.        All past medical history, surgical history, allergies, family history, immunizations andmedications were updated in the EMR today and reviewed under the history and medication portions of their EMR.     ROS: Negative, with the exception of above mentioned in HPI   Objective:  BP 120/76    Pulse 97    Temp 99.3 F (37.4 C) (Oral)    Ht 5\' 5"  (1.651 m)    Wt 130 lb (59 kg)    SpO2 100%    BMI 21.63 kg/m  Body mass index is 21.63 kg/m. Physical Exam Vitals and nursing note reviewed.  Constitutional:      General: She is not in acute distress.    Appearance: Normal appearance. She is not ill-appearing, toxic-appearing or diaphoretic.  HENT:     Head: Normocephalic and atraumatic.  Eyes:     General: No scleral icterus.       Right eye: No discharge.        Left eye: No discharge.     Extraocular Movements: Extraocular movements intact.     Conjunctiva/sclera: Conjunctivae normal.     Pupils: Pupils are equal, round, and reactive to light.  Cardiovascular:     Rate and Rhythm: Normal rate and regular rhythm.  Pulmonary:     Effort: Pulmonary effort is normal. No respiratory distress.     Breath sounds: Normal breath sounds. No wheezing, rhonchi or rales.  Musculoskeletal:     Cervical back: Neck supple. No tenderness.  Lymphadenopathy:     Cervical: No cervical adenopathy.  Skin:    General: Skin is warm and dry.     Coloration: Skin is not jaundiced or pale.     Findings: No  erythema or rash.  Neurological:     Mental Status: She is alert and oriented to person, place, and time. Mental status is at baseline.     Motor: No weakness.     Gait: Gait normal.  Psychiatric:        Mood and Affect: Mood normal.        Behavior: Behavior normal.        Thought Content: Thought content normal.        Judgment: Judgment normal.    No results found. No results found. No results found for this or any previous visit (from the past 24 hour(s)).  Assessment/Plan: Suzanne Jarvis is a 31 y.o. female present for OV for  Attention deficit disorder (ADD) without hyperactivity/Controlled substance agreement signed/history of concussion with focus deficit Stable Continue  Adderall to 10 mg twice dail (#90 d scripts x2 provided) [place] controlled substance database reviewed 12/08/21 Controlled substance agreement signed  UDS completed Follow-up 5.5 months  Irritable bowel syndrome with diarrhea Stable.  Continue  Lomotil as needed Continue Bentyl as needed  Seasonal allergies Following with allergist.   Extremity swelling and pain/cold extremities: Following with rheumatology.  However, most recent visit with rheumatology they suggested possible referral to neurology since she is not seeing great improvement with treatment. Has appt with Neuro- 12/17/2021  Reviewed expectations re: course of current medical issues. Discussed self-management of symptoms. Outlined signs and symptoms indicating need for more acute intervention. Patient verbalized understanding and all questions were answered. Patient received an After-Visit Summary.   No orders of the defined types were placed in this encounter.  Meds ordered this encounter  Medications   dicyclomine (BENTYL) 10 MG capsule    Sig: TAKE 1 CAPSULE BY MOUTH FOUR TIMES DAILY BEFORE MEALS AND AT BEDTIME PRN    Dispense:  120 capsule    Refill:  3    Prn med- please only fill if pt request    amphetamine-dextroamphetamine (ADDERALL) 10 MG tablet    Sig: Take 1 tablet (10 mg total) by mouth 2 (two) times daily.    Dispense:  180 tablet    Refill:  0   amphetamine-dextroamphetamine (ADDERALL) 10 MG tablet    Sig: Take 1 tablet (10 mg total) by mouth 2 (two) times daily.    Dispense:  180 tablet    Refill:  0    May refill ~85 days after date written   diphenoxylate-atropine (LOMOTIL) 2.5-0.025 MG tablet    Sig: TAKE 1 TABLET BY MOUTH FOUR TIMES DAILY AS NEEDED FOR DIARRHEA OR LOOSE STOOLS    Dispense:  180 tablet    Refill:  1    Referral Orders  No referral(s) requested today    Note is dictated utilizing voice recognition software. Although note has been proof read prior to signing, occasional typographical errors still can be missed. If any questions arise, please do not hesitate to call for verification.   electronically signed by:  Howard Pouch, DO  [place]

## 2021-12-13 ENCOUNTER — Other Ambulatory Visit: Payer: Self-pay | Admitting: *Deleted

## 2021-12-13 MED ORDER — OLOPATADINE HCL 0.1 % OP SOLN: 1.0000 [drp] | Freq: Two times a day (BID) | OPHTHALMIC | 5 refills | Status: DC | PRN

## 2021-12-13 MED ORDER — LEVOCETIRIZINE DIHYDROCHLORIDE 5 MG PO TABS: ORAL_TABLET | ORAL | 1 refills | Status: DC

## 2021-12-13 MED ORDER — EPINEPHRINE 0.3 MG/0.3ML IJ SOAJ: 0.3000 mg | INTRAMUSCULAR | 1 refills | Status: DC | PRN

## 2021-12-13 MED ORDER — MONTELUKAST SODIUM 10 MG PO TABS: 10.0000 mg | ORAL_TABLET | Freq: Every day | ORAL | 1 refills | Status: DC

## 2021-12-16 ENCOUNTER — Ambulatory Visit (INDEPENDENT_AMBULATORY_CARE_PROVIDER_SITE_OTHER): Payer: 59

## 2021-12-16 DIAGNOSIS — J309 Allergic rhinitis, unspecified: Secondary | ICD-10-CM | POA: Diagnosis not present

## 2021-12-16 NOTE — Progress Notes (Signed)
[facility] Clinic Note - Initial Visit   Date: 12/17/21  Suzanne Jarvis MRN: [id number] DOB: December 25, 1990   Dear Dr. Raoul Pitch:  Thank you for your kind referral of Suzanne Jarvis for consultation of bilateral hand and feet pain and redness. Although her history is well known to you, please allow [place] to reiterate it for the purpose of our medical record. The patient was accompanied to the clinic by self.    History of Present Illness: Suzanne Jarvis is a 31 y.o. right-handed female with ADD, allergic rhinitis, and Raynaud's syndrome presenting for evaluation of bilateral hand pain and swelling.   Starting around 2021, she has burning sensation of the hands and feet and feet with associated swelling and redness.  She has some tingling prior to the discoloration of her hands.  The symptoms are not alleviated by ice application.  Symptoms can vary in location and sometimes involve just 1 hand or both feet.  She also complains of muscle cramps involving the legs, shoulders, and back. She denies problems with weakness or falls. Previously treated for possible erythromelalgia with high dose aspirin, which she did not tolerate. She was given amlodipine which did not help. She had prior rheumatological work-up including ESR, CRP, ANCA,C3/C4, TSH, and ANA, of which ANA was mildly elevated 1:80.  He is here for neurological evaluation of the symptoms.  She works as a Theme park manager. Nonsmoker, does not drink alcohol.  NCS/EMG of the arms 06/11/2021: Normal MRI brain 05/10/2020: Normal  Out-side paper records, electronic medical record, and images have been reviewed where available and summarized as:  Lab Results  Component Value Date   HGBA1C 4.4 01/29/2020   Lab Results  Component Value Date   VITAMINB12 566 09/30/2015   Lab Results  Component Value Date   TSH 3.89 01/13/2021   Lab Results  Component Value Date   ESRSEDRATE 2 09/22/2021    Past Medical  History:  Diagnosis Date   ADD (attention deficit disorder) 07/08/2020   Angio-edema    ASCUS with positive high risk HPV cervical 03/26/2019   01/2019; negative colposcopy 03/2019; plan repeat pap and HPV test 01/2020   Asthma    Chronic headaches    Concussion with no loss of consciousness 07/08/2020   May 2021   Eczema    IBS (irritable bowel syndrome)    Penicillin allergy 04/28/2020   Seasonal and perennial allergic rhinoconjunctivitis 07/28/2020   Tonsillitis    Urticaria     Past Surgical History:  Procedure Laterality Date   WISDOM TOOTH EXTRACTION       Medications:  Outpatient Encounter Medications as of 12/17/2021  Medication Sig   amphetamine-dextroamphetamine (ADDERALL) 10 MG tablet Take 1 tablet (10 mg total) by mouth 2 (two) times daily.   amphetamine-dextroamphetamine (ADDERALL) 10 MG tablet Take 1 tablet (10 mg total) by mouth 2 (two) times daily.   ascorbic acid (VITAMIN C) 500 MG tablet Take by mouth.   Cyanocobalamin (B-12) 1000 MCG TABS Take 2 tablets by mouth daily.   dicyclomine (BENTYL) 10 MG capsule TAKE 1 CAPSULE BY MOUTH FOUR TIMES DAILY BEFORE MEALS AND AT BEDTIME PRN   diphenoxylate-atropine (LOMOTIL) 2.5-0.025 MG tablet TAKE 1 TABLET BY MOUTH FOUR TIMES DAILY AS NEEDED FOR DIARRHEA OR LOOSE STOOLS   drospirenone-ethinyl estradiol (YAZ,GIANVI,LORYNA) 3-0.02 MG tablet Take 1 tablet by mouth daily.   EPINEPHrine (AUVI-Q) 0.3 mg/0.3 mL IJ SOAJ injection Inject 0.3 mg into the muscle as needed for anaphylaxis.   levocetirizine (  XYZAL) 5 MG tablet TAKE 1 TABLET(5 MG) BY MOUTH EVERY EVENING   Magnesium 100 MG TABS Take by mouth. W/ calcium and zinc   montelukast (SINGULAIR) 10 MG tablet Take 1 tablet (10 mg total) by mouth at bedtime.   Multiple Vitamins-Minerals (MULTIVITAMIN WITH MINERALS) tablet Take 1 tablet by mouth daily.   nystatin-triamcinolone (MYCOLOG II) cream SMARTSIG:Sparingly Topical 1 to 2 Times Daily PRN   olopatadine (PATANOL) 0.1 % ophthalmic  solution Place 1 drop into both eyes 2 (two) times daily as needed (itchy/watery eyes).   Vitamin D, Cholecalciferol, 25 MCG (1000 UT) CAPS Take by mouth.   ondansetron (ZOFRAN) 4 MG tablet Take 1 tablet (4 mg total) by mouth every 8 (eight) hours as needed for nausea or vomiting. (Patient not taking: Reported on 12/17/2021)   No facility-administered encounter medications on file as of 12/17/2021.    Allergies:  Allergies  Allergen Reactions   Aspirin Anaphylaxis   Amlodipine Other (See Comments)    Per patient burning sensation through body   Amoxicillin Hives   Hyoscyamine Other (See Comments)   Mixed Grasses    Novocain [Procaine] Swelling   Other      grass, weed, ragweed, trees, cat, mold, dog, dust mites. Borderline to cockroach.     Paxil [Paroxetine Hcl]     Dizziness    Penicillins     REACTION: hives    Family History: Family History  Problem Relation Age of Onset   Allergies Mother    Asthma Mother    Hypertension Mother    Allergic rhinitis Mother    Urticaria Mother    Allergic rhinitis Sister    Eczema Sister    Anemia Sister    Hashimoto's thyroiditis Brother    ADD / ADHD Brother    Arthritis Maternal Grandmother    Irritable bowel syndrome Maternal Grandmother    Breast cancer Maternal Grandmother    Lupus Cousin    Hashimoto's thyroiditis Cousin    Lung cancer Other        paternal great grandfather   Lung cancer Other        maternal great grandfather   Pancreatic cancer Other        great aunt   Colon cancer Neg Hx     Social History: Social History   Tobacco Use   Smoking status: Former    Packs/day: 0.25    Years: 6.00    Pack years: 1.50    Types: Cigarettes    Quit date: 2018    Years since quitting: 5.0   Smokeless tobacco: Never  Vaping Use   Vaping Use: Some days  Substance Use Topics   Alcohol use: Yes    Comment: Rarely   Drug use: No   Social History   Social History Narrative   Right Handed    Lives in a two story  condo on the second floor    Goes to the gyn 3 times a week.      Has a [name] that she called her son     Vital Signs:  BP 135/76    Pulse 92    Ht $R'5\' 5"'mo$  (1.651 m)    Wt 129 lb (58.5 kg)    SpO2 100%    BMI 21.47 kg/m   Neurological Exam: MENTAL STATUS including orientation to time, place, person, recent and remote memory, attention span and concentration, language, and fund of knowledge is normal.  Speech is not dysarthric.  CRANIAL NERVES:  II:  No visual field defects.     III-IV-VI: Pupils equal round and reactive to light.  Normal conjugate, extra-ocular eye movements in all directions of gaze.  No nystagmus.  No ptosis.   V:  Normal facial sensation.    VII:  Normal facial symmetry and movements.   VIII:  Normal hearing and vestibular function.   IX-X:  Normal palatal movement.   XI:  Normal shoulder shrug and head rotation.   XII:  Normal tongue strength and range of motion, no deviation or fasciculation.  MOTOR: Motor strength is 5/5 throughout proximally and distally.  No atrophy, fasciculations or abnormal movements.  No pronator drift.   MSRs:  Right        Left                  brachioradialis 2+  2+  biceps 2+  2+  triceps 2+  2+  patellar 2+  2+  ankle jerk 2+  2+  Hoffman no  no  plantar response down  down   SENSORY:  Normal and symmetric perception of light touch, pinprick, vibration.  Romberg's sign absent.   COORDINATION/GAIT: Normal finger-to- nose-finger.  Intact rapid alternating movements bilaterally.  Gait narrow based and stable. Tandem and stressed gait intact.    IMPRESSION: Bilateral hand and feet dysesthesias with burning pain and erythema. Symptoms are not typical for neuropathy.  NCS/EMG of the arms was normal.  My overall suspicion for primary nerve pathology is low as neuropathy symptoms tend to be more constant.  The fact that the the ice/cold temperature over aspirin helped her symptoms makes erythromelalgia less likely.  I will refer  her to podiatry skin biopsy to evaluate for small fiber neuropathy.     Thank you for allowing me to participate in patient's care.  If I can answer any additional questions, I would be pleased to do so.    Sincerely,    Arlissa Monteverde K. Posey Pronto, DO

## 2021-12-17 ENCOUNTER — Ambulatory Visit: Payer: 59 | Admitting: Neurology

## 2021-12-17 ENCOUNTER — Telehealth: Payer: Self-pay | Admitting: Family Medicine

## 2021-12-17 ENCOUNTER — Other Ambulatory Visit: Payer: Self-pay

## 2021-12-17 ENCOUNTER — Encounter: Payer: Self-pay | Admitting: Neurology

## 2021-12-17 VITALS — BP 135/76 | HR 92 | Ht 65.0 in | Wt 129.0 lb

## 2021-12-17 DIAGNOSIS — R202 Paresthesia of skin: Secondary | ICD-10-CM | POA: Diagnosis not present

## 2021-12-17 NOTE — Patient Instructions (Signed)
Refer to podiatry for skin biopsy

## 2021-12-17 NOTE — Telephone Encounter (Signed)
Noted  

## 2021-12-17 NOTE — Telephone Encounter (Signed)
Pt called in wanting to schedule an appointment with Dr. Kirtland Bouchard  During her appointment she'll discuss about wanting to see another Neurologist. Pt said she didn't feel like she was seen properly during the visit.

## 2021-12-22 ENCOUNTER — Ambulatory Visit (INDEPENDENT_AMBULATORY_CARE_PROVIDER_SITE_OTHER): Payer: 59

## 2021-12-22 ENCOUNTER — Ambulatory Visit: Payer: 59 | Admitting: Podiatry

## 2021-12-22 ENCOUNTER — Encounter: Payer: Self-pay | Admitting: Podiatry

## 2021-12-22 ENCOUNTER — Other Ambulatory Visit: Payer: Self-pay

## 2021-12-22 DIAGNOSIS — I7300 Raynaud's syndrome without gangrene: Secondary | ICD-10-CM | POA: Diagnosis not present

## 2021-12-22 DIAGNOSIS — R202 Paresthesia of skin: Secondary | ICD-10-CM

## 2021-12-22 NOTE — Progress Notes (Signed)
°  Subjective:  Patient ID: Suzanne Jarvis, female    DOB: February 02, 1991,   MRN: [id number]  Chief Complaint  Patient presents with   Foot Pain    Pt stated that she wanted to have her feet examined     31 y.o. female presents for concern of bilateral foot pain and flares. Relates she has been seen by Rheumatology and neurology for bilateral foot flares that occur at least once a day and are very painful. Relates redness swelling and cannot stand on her feet. Relates this also happens in her hands and she has had many different studies done. Does have a positive ANA. NCV were negative. Neurology had recommended small nerve biopsy and foot examination which is why she is here today.   . Denies any other pedal complaints. Denies n/v/f/c.   Past Medical History:  Diagnosis Date   ADD (attention deficit disorder) 07/08/2020   Angio-edema    ASCUS with positive high risk HPV cervical 03/26/2019   01/2019; negative colposcopy 03/2019; plan repeat pap and HPV test 01/2020   Asthma    Chronic headaches    Concussion with no loss of consciousness 07/08/2020   May 2021   Eczema    IBS (irritable bowel syndrome)    Penicillin allergy 04/28/2020   Seasonal and perennial allergic rhinoconjunctivitis 07/28/2020   Tonsillitis    Urticaria     Objective:  Physical Exam: Vascular: DP/PT pulses 2/4 bilateral. CFT <3 seconds. Normal hair growth on digits. No edema.  Skin. No lacerations or abrasions bilateral feet. Pictures show show erythema and edema noted to both feet. No erythema or edema noted on todays exam.  Musculoskeletal: MMT 5/5 bilateral lower extremities in DF, PF, Inversion and Eversion. Deceased ROM in DF of ankle joint. No tenderness to feet today. No pain to palpation.  Neurological: Sensation intact to light touch. Protective sensation.   Assessment:   1. Paresthesia of both feet   2. Raynaud's disease without gangrene      Plan:  Patient was evaluated and treated and all questions  answered. Discussed neuropathy and parathesias  and etiology as well as treatment with patient.  Discussed auto immune vs vasculitis vs nerve cause.  Radiographs reviewed and discussed with patient.  -Will order ABIs.  -Will follow-up with Dr. Jacqualyn Posey for small nerve biopsy.   Lorenda Peck, DPM

## 2021-12-23 ENCOUNTER — Ambulatory Visit (INDEPENDENT_AMBULATORY_CARE_PROVIDER_SITE_OTHER): Payer: 59

## 2021-12-23 DIAGNOSIS — J309 Allergic rhinitis, unspecified: Secondary | ICD-10-CM

## 2021-12-29 ENCOUNTER — Encounter: Payer: Self-pay | Admitting: Family Medicine

## 2021-12-29 ENCOUNTER — Other Ambulatory Visit: Payer: Self-pay

## 2021-12-29 ENCOUNTER — Ambulatory Visit (INDEPENDENT_AMBULATORY_CARE_PROVIDER_SITE_OTHER): Payer: 59 | Admitting: Family Medicine

## 2021-12-29 VITALS — BP 127/75 | HR 90 | Temp 98.4°F | Ht 65.0 in | Wt 130.0 lb

## 2021-12-29 DIAGNOSIS — R202 Paresthesia of skin: Secondary | ICD-10-CM

## 2021-12-29 DIAGNOSIS — R768 Other specified abnormal immunological findings in serum: Secondary | ICD-10-CM

## 2021-12-29 DIAGNOSIS — I7300 Raynaud's syndrome without gangrene: Secondary | ICD-10-CM | POA: Diagnosis not present

## 2021-12-29 DIAGNOSIS — K120 Recurrent oral aphthae: Secondary | ICD-10-CM | POA: Diagnosis not present

## 2021-12-29 DIAGNOSIS — M25571 Pain in right ankle and joints of right foot: Secondary | ICD-10-CM | POA: Diagnosis not present

## 2021-12-29 NOTE — Progress Notes (Signed)
This visit occurred during the SARS-CoV-2 public health emergency.  Safety protocols were in place, including screening questions prior to the visit, additional usage of staff PPE, and extensive cleaning of exam room while observing appropriate contact time as indicated for disinfecting solutions.    Suzanne Jarvis , 1991-03-05, 31 y.o., female MRN: [id number] Patient Care Team    Relationship Specialty Notifications Start End  [name], DO PCP - General Family Medicine  09/04/15   [name], NP (Inactive) Nurse Practitioner Obstetrics and Gynecology  01/29/20     Chief Complaint  Patient presents with   Referral   Numbness   ana pos     Subjective: Pt presents for an OV to discuss her ongoing symptoms of intermittent erythema, numbness and tingling, blanching skin, arthralgia of her extremities.  Symptoms originally started in 2021.  Started as a burning sensation in her hands and feet that would also become swollen and red at that time.  She has experienced similar symptoms in her hands when they become tingling and then she would notice discoloration redness/to pale.  High-dose aspirin was not effective, she is prescribed a stimulant status post concussion for mental fog and this does not improve or worsen her symptoms.  Amlodipine was not helpful.  He was prescribed hydroxychloroquine 100 mg p.o. daily for about 2 months and it also was not helpful.   Prior labs of ESR, CRP, ANCA, C3-C4 and TSH were all normal.  She had a positive low titer 1: 80 ANA-homogenous.  Negative double-stranded DNA antibody, negative myeloperoxidase antibodies, negative serene protease 3 antibodies, negative ENA SM antibodies.  Negative Ro/La.  Negative scleroderma, negative SM/RNP.  NCS/EMG of the arm 7 07/2021 was normal.  Normal uric acid level, tryptase, SPEP [employer] haird[employer]er.  She is a non-smoker.  Depression screen [facility] 2/9 09/29/2021 10/14/2020 01/29/2020 11/20/2019  Decreased Interest 0 0 0  0  Down, Depressed, Hopeless 0 0 0 0  PHQ - 2 Score 0 0 0 0    Allergies  Allergen Reactions   Aspirin Anaphylaxis   Amlodipine Other (See Comments)    Per patient burning sensation through body   Amoxicillin Hives   Hyoscyamine Other (See Comments)   Mixed Grasses    Novocain [Procaine] Swelling   Other      grass, weed, ragweed, trees, cat, mold, dog, dust mites. Borderline to cockroach.     Paxil [Paroxetine Hcl]     Dizziness    Penicillins     REACTION: hives   Social History   Social History Narrative   Right Handed    Lives in a two story condo on the second floor    Goes to the gyn 3 times a week.      Has a [name] that she called her son    Past Medical History:  Diagnosis Date   ADD (attention deficit disorder) 07/08/2020   Angio-edema    ASCUS with positive high risk HPV cervical 03/26/2019   01/2019; negative colposcopy 03/2019; plan repeat pap and HPV test 01/2020   Asthma    Chronic headaches    Concussion with no loss of consciousness 07/08/2020   May 2021   Eczema    IBS (irritable bowel syndrome)    Penicillin allergy 04/28/2020   Seasonal and perennial allergic rhinoconjunctivitis 07/28/2020   Tonsillitis    Urticaria    Past Surgical History:  Procedure Laterality Date   WISDOM TOOTH EXTRACTION  Family History  Problem Relation Age of Onset   Allergies Mother    Asthma Mother    Hypertension Mother    Allergic rhinitis Mother    Urticaria Mother    Allergic rhinitis Sister    Eczema Sister    Anemia Sister    Hashimoto's thyroiditis Brother    ADD / ADHD Brother    Arthritis Maternal Grandmother    Irritable bowel syndrome Maternal Grandmother    Breast cancer Maternal Grandmother    Lupus Cousin    Hashimoto's thyroiditis Cousin    Lung cancer Other        paternal great grandfather   Lung cancer Other        maternal great grandfather   Pancreatic cancer Other        great aunt   Colon cancer Neg Hx    Allergies as of  12/29/2021       Reactions   Aspirin Anaphylaxis   Amlodipine Other (See Comments)   Per patient burning sensation through body   Amoxicillin Hives   Hyoscyamine Other (See Comments)   Mixed Grasses    Novocain [procaine] Swelling   Other     grass, weed, ragweed, trees, cat, mold, dog, dust mites. Borderline to cockroach.     Paxil [paroxetine Hcl]    Dizziness   Penicillins    REACTION: hives        Medication List        Accurate as of December 29, 2021 11:59 PM. If you have any questions, ask your nurse or doctor.          amphetamine-dextroamphetamine 10 MG tablet Commonly known as: Adderall Take 1 tablet (10 mg total) by mouth 2 (two) times daily.   amphetamine-dextroamphetamine 10 MG tablet Commonly known as: ADDERALL Take 1 tablet (10 mg total) by mouth 2 (two) times daily.   ascorbic acid 500 MG tablet Commonly known as: VITAMIN C Take by mouth.   B-12 1000 MCG Tabs Take 2 tablets by mouth daily.   dicyclomine 10 MG capsule Commonly known as: BENTYL TAKE 1 CAPSULE BY MOUTH FOUR TIMES DAILY BEFORE MEALS AND AT BEDTIME PRN   diphenoxylate-atropine 2.5-0.025 MG tablet Commonly known as: LOMOTIL TAKE 1 TABLET BY MOUTH FOUR TIMES DAILY AS NEEDED FOR DIARRHEA OR LOOSE STOOLS   drospirenone-ethinyl estradiol 3-0.02 MG tablet Commonly known as: YAZ Take 1 tablet by mouth daily.   EPINEPHrine 0.3 mg/0.3 mL Soaj injection Commonly known as: Auvi-Q Inject 0.3 mg into the muscle as needed for anaphylaxis.   levocetirizine 5 MG tablet Commonly known as: XYZAL TAKE 1 TABLET(5 MG) BY MOUTH EVERY EVENING   Magnesium 100 MG Tabs Take by mouth. W/ calcium and zinc   montelukast 10 MG tablet Commonly known as: SINGULAIR Take 1 tablet (10 mg total) by mouth at bedtime.   multivitamin with minerals tablet Take 1 tablet by mouth daily.   nystatin-triamcinolone cream Commonly known as: MYCOLOG II SMARTSIG:Sparingly Topical 1 to 2 Times Daily PRN    olopatadine 0.1 % ophthalmic solution Commonly known as: Patanol Place 1 drop into both eyes 2 (two) times daily as needed (itchy/watery eyes).   ondansetron 4 MG tablet Commonly known as: ZOFRAN Take 1 tablet (4 mg total) by mouth every 8 (eight) hours as needed for nausea or vomiting.   Vitamin D (Cholecalciferol) 25 MCG (1000 UT) Caps Take by mouth.        All past medical history, surgical history, allergies, family history, immunizations  andmedications were updated in the EMR today and reviewed under the history and medication portions of their EMR.     ROS Negative, with the exception of above mentioned in HPI   Objective:  BP 127/75    Pulse 90    Temp 98.4 F (36.9 C) (Oral)    Ht _0  (1.651 m)    Wt 130 lb (59 kg)    SpO2 99%    BMI 21.63 kg/m  Body mass index is 21.63 kg/m. Physical Exam Vitals and nursing note reviewed.  Constitutional:      General: She is not in acute distress.    Appearance: Normal appearance. She is normal weight. She is not ill-appearing or toxic-appearing.  Eyes:     Extraocular Movements: Extraocular movements intact.     Conjunctiva/sclera: Conjunctivae normal.     Pupils: Pupils are equal, round, and reactive to light.  Cardiovascular:     Rate and Rhythm: Normal rate and regular rhythm.     Heart sounds: No murmur heard. Pulmonary:     Effort: Pulmonary effort is normal.     Breath sounds: Normal breath sounds.  Musculoskeletal:     Right lower leg: No edema.     Left lower leg: No edema.  Lymphadenopathy:     Cervical: No cervical adenopathy.  Skin:    Findings: No rash.  Neurological:     Mental Status: She is alert and oriented to person, place, and time. Mental status is at baseline.  Psychiatric:        Mood and Affect: Mood normal.        Behavior: Behavior normal.        Thought Content: Thought content normal.        Judgment: Judgment normal.     No results found. No results found. No results found for  this or any previous visit (from the past 24 hour(s)).  Assessment/Plan: INETA SINNING is a 31 y.o. female present for OV for  Positive ANA (antinuclear antibody)/paresthesia/mouth ulcers/Raynaud's/arthralgia Patient has been evaluated for the above symptoms by both rheumatology and neurology.  Neurology recommended she be seen by podiatry to have a small nerve biopsy completed, she has this scheduled. ABIs are also ordered for her. She would like referral for second opinion rheumatology.  She has not had any answers to why she is experiencing the symptoms which continue to occur for her.  Understandably, she is becoming more frustrated. - Ambulatory referral to Rheumatology-Dr. Z requested   Reviewed expectations re: course of current medical issues. Discussed self-management of symptoms. Outlined signs and symptoms indicating need for more acute intervention. Patient verbalized understanding and all questions were answered. Patient received an After-Visit Summary.    Orders Placed This Encounter  Procedures   Ambulatory referral to Rheumatology   No orders of the defined types were placed in this encounter.  Referral Orders         Ambulatory referral to Rheumatology       Note is dictated utilizing voice recognition software. Although note has been proof read prior to signing, occasional typographical errors still can be missed. If any questions arise, please do not hesitate to call for verification.   electronically signed by:  Howard Pouch, DO  [place]

## 2021-12-30 ENCOUNTER — Ambulatory Visit (INDEPENDENT_AMBULATORY_CARE_PROVIDER_SITE_OTHER): Payer: 59

## 2021-12-30 DIAGNOSIS — J309 Allergic rhinitis, unspecified: Secondary | ICD-10-CM | POA: Diagnosis not present

## 2022-01-05 DIAGNOSIS — Z1389 Encounter for screening for other disorder: Secondary | ICD-10-CM | POA: Diagnosis not present

## 2022-01-05 DIAGNOSIS — R69 Illness, unspecified: Secondary | ICD-10-CM | POA: Diagnosis not present

## 2022-01-05 DIAGNOSIS — N9089 Other specified noninflammatory disorders of vulva and perineum: Secondary | ICD-10-CM | POA: Diagnosis not present

## 2022-01-05 DIAGNOSIS — Z130 Encounter for screening for diseases of the blood and blood-forming organs and certain disorders involving the immune mechanism: Secondary | ICD-10-CM | POA: Diagnosis not present

## 2022-01-05 DIAGNOSIS — Z01419 Encounter for gynecological examination (general) (routine) without abnormal findings: Secondary | ICD-10-CM | POA: Diagnosis not present

## 2022-01-05 DIAGNOSIS — Z3041 Encounter for surveillance of contraceptive pills: Secondary | ICD-10-CM | POA: Diagnosis not present

## 2022-01-05 LAB — HM PAP SMEAR

## 2022-01-06 ENCOUNTER — Other Ambulatory Visit: Payer: Self-pay

## 2022-01-06 ENCOUNTER — Ambulatory Visit (INDEPENDENT_AMBULATORY_CARE_PROVIDER_SITE_OTHER): Payer: 59 | Admitting: *Deleted

## 2022-01-06 ENCOUNTER — Ambulatory Visit: Payer: 59 | Admitting: Podiatry

## 2022-01-06 DIAGNOSIS — J309 Allergic rhinitis, unspecified: Secondary | ICD-10-CM

## 2022-01-06 DIAGNOSIS — G609 Hereditary and idiopathic neuropathy, unspecified: Secondary | ICD-10-CM | POA: Diagnosis not present

## 2022-01-06 DIAGNOSIS — I7300 Raynaud's syndrome without gangrene: Secondary | ICD-10-CM

## 2022-01-06 DIAGNOSIS — R2990 Unspecified symptoms and signs involving the nervous system: Secondary | ICD-10-CM | POA: Diagnosis not present

## 2022-01-07 ENCOUNTER — Other Ambulatory Visit: Payer: Self-pay | Admitting: Podiatry

## 2022-01-07 DIAGNOSIS — R202 Paresthesia of skin: Secondary | ICD-10-CM

## 2022-01-09 NOTE — Progress Notes (Signed)
Subjective: 31 year old female presents the office today for nerve biopsy.  She was originally seen by Dr. Ralene Cork for concerns of bilateral foot pain.  She reports redness and swelling to her feet and also her hands.  She gets burning to her feet.  She previously seen neurology and was told that the "big nerves" are doing okay but was not able to test the small nerves.  She is also in the process of getting a new rheumatology referral.  Objective: AAO x3, NAD DP/PT pulses palpable bilaterally, CRT less than 3 seconds No specific area of tenderness noted bilaterally.  She does show me pictures where her feet turn bright red.  She is describing burning to her feet. No pain with calf compression, swelling, warmth, erythema  Assessment: Rule out small fiber neuropathy  Plan: -All treatment options discussed with the patient including all alternatives, risks, complications.  -We discussed nerve biopsy including risks and complications of this.  She understands and verbal consent was obtained.  I cleaned the skin with alcohol and a mixture 1 cc lidocaine with epinephrine (which she has done well with previously) was infiltrated bilaterally just proximal to the area of the nerve biopsy sites.  I then prepped the skin with Betadine, alcohol.  10 cm proximal to the lateral malleolus punch biopsy was performed bilaterally and placed in the appropriate container.  I irrigated the procedure sites with alcohol and antibiotic ointment and a bandage applied.  She tolerated the procedure well any complications.  Monitor for any signs or symptoms of infection. -Patient encouraged to call the office with any questions, concerns, change in symptoms.   Vivi Barrack DPM

## 2022-01-10 ENCOUNTER — Telehealth: Payer: Self-pay | Admitting: Podiatry

## 2022-01-10 NOTE — Telephone Encounter (Signed)
Suzanne Jarvis with [employer] called and she needs clinical clarification - Signature was missing from requisition, need to verify the biopsy site - is it the info on the vial sticker or on the handwritten note, and what type of test is needed - this was left unchecked. Fax # [phone].

## 2022-01-11 NOTE — Telephone Encounter (Signed)
Sherry with [facility] called back to follow up because she did not get a response.  Please advise

## 2022-01-12 ENCOUNTER — Other Ambulatory Visit: Payer: Self-pay

## 2022-01-12 ENCOUNTER — Ambulatory Visit (HOSPITAL_COMMUNITY)
Admission: RE | Admit: 2022-01-12 | Discharge: 2022-01-12 | Disposition: A | Discharge status: Home / Self Care | Payer: 59 | Source: Ambulatory Visit | Attending: Cardiovascular Disease | Admitting: Cardiovascular Disease

## 2022-01-12 DIAGNOSIS — R202 Paresthesia of skin: Secondary | ICD-10-CM | POA: Diagnosis not present

## 2022-01-13 ENCOUNTER — Ambulatory Visit (INDEPENDENT_AMBULATORY_CARE_PROVIDER_SITE_OTHER): Payer: 59 | Admitting: *Deleted

## 2022-01-13 DIAGNOSIS — J309 Allergic rhinitis, unspecified: Secondary | ICD-10-CM | POA: Diagnosis not present

## 2022-01-17 ENCOUNTER — Other Ambulatory Visit: Payer: Self-pay

## 2022-01-17 DIAGNOSIS — F988 Other specified behavioral and emotional disorders with onset usually occurring in childhood and adolescence: Secondary | ICD-10-CM

## 2022-01-17 MED ORDER — AMPHETAMINE-DEXTROAMPHETAMINE 10 MG PO TABS: 10.0000 mg | ORAL_TABLET | Freq: Two times a day (BID) | ORAL | 0 refills | Status: DC

## 2022-01-17 NOTE — Telephone Encounter (Signed)
There are a couple [facility] on [address]. Will need address. Please add to pharmacy list. Thanks.  ?

## 2022-01-17 NOTE — Telephone Encounter (Signed)
Patient refill request.  Because there is a Tree surgeon of medication, patient had to call around to different pharmacies to check in stock available.  ?She found medication in stock (or they will have it when able to fill this week)  at [place]. [phone] ? ?amphetamine-dextroamphetamine (ADDERALL) 10 MG tablet [id number]  ?

## 2022-01-17 NOTE — Telephone Encounter (Signed)
Please send to [phone]  ? ?

## 2022-01-17 NOTE — Telephone Encounter (Signed)
Please advise 

## 2022-01-20 ENCOUNTER — Ambulatory Visit: Payer: 59 | Admitting: Podiatry

## 2022-01-20 ENCOUNTER — Other Ambulatory Visit: Payer: Self-pay

## 2022-01-20 ENCOUNTER — Ambulatory Visit (INDEPENDENT_AMBULATORY_CARE_PROVIDER_SITE_OTHER): Payer: 59 | Admitting: *Deleted

## 2022-01-20 DIAGNOSIS — G609 Hereditary and idiopathic neuropathy, unspecified: Secondary | ICD-10-CM

## 2022-01-20 DIAGNOSIS — R202 Paresthesia of skin: Secondary | ICD-10-CM

## 2022-01-20 DIAGNOSIS — J309 Allergic rhinitis, unspecified: Secondary | ICD-10-CM | POA: Diagnosis not present

## 2022-01-23 NOTE — Progress Notes (Signed)
Subjective: ?31 year old female presents the office today for follow evaluation after undergoing nerve biopsy.  She said the procedure sites of been healing well she has no issues.  No swelling, redness or drainage.  No pain.  She has no other concerns. ? ?Objective: ?AAO x3, NAD ?DP/PT pulses palpable bilaterally, CRT less than 3 seconds ?Status post punch biopsy bilaterally.  They appear to be healing well at this time postoperatively.  Small amount of granulation tissue still evident.  There is no edema, erythema or signs of infection.  No pain with calf compression, swelling, warmth, erythema ? ?Assessment: ?Status post nerve biopsy ? ?Plan: ?-All treatment options discussed with the patient including all alternatives, risks, complications.  ?-Wounds are healing well.  Continue washing with soap and water.  Apply antibiotic ointment and a bandage in the day.  Leave the area open at nighttime. ?-Unfortunately the nerve biopsy was negative.  She has a scheduled follow-up with her rheumatologist and I will fax her the results (Dr. Hermelinda Medicus, MD ph: [phone]) ?-Patient encouraged to call the office with any questions, concerns, change in symptoms.  ? ?Trula Slade DPM ? ? ?

## 2022-01-27 ENCOUNTER — Ambulatory Visit (INDEPENDENT_AMBULATORY_CARE_PROVIDER_SITE_OTHER): Payer: 59

## 2022-01-27 DIAGNOSIS — J309 Allergic rhinitis, unspecified: Secondary | ICD-10-CM | POA: Diagnosis not present

## 2022-02-01 ENCOUNTER — Ambulatory Visit (INDEPENDENT_AMBULATORY_CARE_PROVIDER_SITE_OTHER): Payer: 59

## 2022-02-01 DIAGNOSIS — J309 Allergic rhinitis, unspecified: Secondary | ICD-10-CM | POA: Diagnosis not present

## 2022-02-03 DIAGNOSIS — K121 Other forms of stomatitis: Secondary | ICD-10-CM | POA: Diagnosis not present

## 2022-02-03 DIAGNOSIS — Z79899 Other long term (current) drug therapy: Secondary | ICD-10-CM | POA: Diagnosis not present

## 2022-02-03 DIAGNOSIS — R768 Other specified abnormal immunological findings in serum: Secondary | ICD-10-CM | POA: Diagnosis not present

## 2022-02-10 ENCOUNTER — Emergency Department
Admission: RE | Admit: 2022-02-10 | Discharge: 2022-02-10 | Disposition: A | Discharge status: Home / Self Care | Payer: 59 | Source: Ambulatory Visit

## 2022-02-10 VITALS — BP 129/86 | HR 73 | Temp 99.0°F | Resp 20 | Ht 65.0 in | Wt 132.0 lb

## 2022-02-10 DIAGNOSIS — J0100 Acute maxillary sinusitis, unspecified: Secondary | ICD-10-CM | POA: Diagnosis not present

## 2022-02-10 MED ORDER — DOXYCYCLINE HYCLATE 100 MG PO CAPS: 100.0000 mg | ORAL_CAPSULE | Freq: Two times a day (BID) | ORAL | 0 refills | Status: AC

## 2022-02-10 NOTE — ED Triage Notes (Signed)
Pt presents to Urgent Care with c/o nasal congestion, cough, and bilateral ear fullness x 2 days. Denies fever. Reports negative COVID test x 2 (home). Reports having frequent hx of sinus infections.  ?

## 2022-02-10 NOTE — Discharge Instructions (Addendum)
Advised patient to take medication as directed with food to completion.  Encouraged patient to increase daily water intake while taking this medication.  Advised patient if symptoms worsen and/or unresolved please follow-up with PCP or here for further evaluation. 

## 2022-02-10 NOTE — ED Provider Notes (Signed)
?[facility] ? ? ? ?CSN: [id number] ?Arrival date & time: 02/10/22  2694 ? ? ?  ? ?History   ?Chief Complaint ?Chief Complaint  ?Patient presents with  ? Nasal Congestion  ? 10:00 appt  ? Cough  ? Ear Fullness  ? ? ?HPI ?Suzanne Jarvis is a 31 y.o. female.  ? ?HPI 31 year old female presents with sinus nasal congestion, cough, and bilateral ear fullness for 2 days.  Patient reports history of sinus infections.  PMH significant for frequent URIs, asthma, IBS, and bilateral hand numbness. ? ?Past Medical History:  ?Diagnosis Date  ? ADD (attention deficit disorder) 07/08/2020  ? Angio-edema   ? ASCUS with positive high risk HPV cervical 03/26/2019  ? 01/2019; negative colposcopy 03/2019; plan repeat pap and HPV test 01/2020  ? Asthma   ? Chronic headaches   ? Concussion with no loss of consciousness 07/08/2020  ? May 2021  ? Eczema   ? IBS (irritable bowel syndrome)   ? Penicillin allergy 04/28/2020  ? Seasonal and perennial allergic rhinoconjunctivitis 07/28/2020  ? Tonsillitis   ? Urticaria   ? ? ?Patient Active Problem List  ? Diagnosis Date Noted  ? Aphthous ulcer of mouth 09/29/2021  ? Metatarsalgia of right foot 07/07/2021  ? Toe pain, chronic, right 07/07/2021  ? Bilateral hand numbness 04/14/2021  ? Drug reaction 03/11/2021  ? Positive ANA (antinuclear antibody) 02/24/2021  ? History of frequent upper respiratory infection 01/27/2021  ? Seasonal allergies 01/14/2021  ? Rash and other nonspecific skin eruption 12/30/2020  ? Seasonal and perennial allergic rhinoconjunctivitis 07/28/2020  ? Concussion with no loss of consciousness 07/08/2020  ? ADD (attention deficit disorder) 07/08/2020  ? Penicillin allergy 04/28/2020  ? IBS (irritable bowel syndrome) 09/30/2015  ? Vitamin B 12 deficiency 12/10/2014  ? ? ?Past Surgical History:  ?Procedure Laterality Date  ? WISDOM TOOTH EXTRACTION    ? ? ?OB History   ?No obstetric history on file. ?  ? ? ? ?Home Medications   ? ?Prior to Admission medications    ?Medication Sig Start Date End Date Taking? Authorizing Provider  ?dextromethorphan (DELSYM) 30 MG/5ML liquid Take by mouth as needed for cough.   Yes [provider]  ?ibuprofen (ADVIL) 200 MG tablet Take 200 mg by mouth every 6 (six) hours as needed.   Yes [provider]  ?amphetamine-dextroamphetamine (ADDERALL) 10 MG tablet Take 1 tablet (10 mg total) by mouth 2 (two) times daily. 12/08/21   [name], [name], DO  ?amphetamine-dextroamphetamine (ADDERALL) 10 MG tablet Take 1 tablet (10 mg total) by mouth 2 (two) times daily. 01/17/22   [name], [name], DO  ?ascorbic acid (VITAMIN C) 500 MG tablet Take by mouth.    [provider]  ?Cyanocobalamin (B-12) 1000 MCG TABS Take 2 tablets by mouth daily.    [provider]  ?dicyclomine (BENTYL) 10 MG capsule TAKE 1 CAPSULE BY MOUTH FOUR TIMES DAILY BEFORE MEALS AND AT BEDTIME PRN 12/08/21   [name], [name], DO  ?diphenoxylate-atropine (LOMOTIL) 2.5-0.025 MG tablet TAKE 1 TABLET BY MOUTH FOUR TIMES DAILY AS NEEDED FOR DIARRHEA OR LOOSE STOOLS 12/08/21   [name], [name], DO  ?doxycycline (VIBRAMYCIN) 100 MG capsule Take 1 capsule (100 mg total) by mouth 2 (two) times daily for 7 days. 02/10/22 02/17/22 Yes Trevor Iha, FNP  ?drospirenone-ethinyl estradiol (YAZ,GIANVI,LORYNA) 3-0.02 MG tablet Take 1 tablet by mouth daily.    [provider]  ?EPINEPHrine (AUVI-Q) 0.3 mg/0.3 mL IJ SOAJ injection Inject  0.3 mg into the muscle as needed for anaphylaxis. 12/13/21   Ellamae Sia, DO  ?levocetirizine (XYZAL) 5 MG tablet TAKE 1 TABLET(5 MG) BY MOUTH EVERY EVENING 12/13/21   Ellamae Sia, DO  ?Magnesium 100 MG TABS Take by mouth. W/ calcium and zinc    [provider]  ?montelukast (SINGULAIR) 10 MG tablet Take 1 tablet (10 mg total) by mouth at bedtime. 12/13/21   Ellamae Sia, DO  ?Multiple Vitamins-Minerals (MULTIVITAMIN WITH MINERALS) tablet Take 1 tablet by mouth daily.    [provider]  ?nystatin-triamcinolone (MYCOLOG  II) cream SMARTSIG:Sparingly Topical 1 to 2 Times Daily PRN 11/10/20   [provider]  ?olopatadine (PATANOL) 0.1 % ophthalmic solution Place 1 drop into both eyes 2 (two) times daily as needed (itchy/watery eyes). 12/13/21   Ellamae Sia, DO  ?ondansetron (ZOFRAN) 4 MG tablet Take 1 tablet (4 mg total) by mouth every 8 (eight) hours as needed for nausea or vomiting. 08/11/21   Fuller Plan, MD  ?Vitamin D, Cholecalciferol, 25 MCG (1000 UT) CAPS Take by mouth.    [provider]  ? ? ?Family History ?Family History  ?Problem Relation Age of Onset  ? Allergies Mother   ? Asthma Mother   ? Hypertension Mother   ? Allergic rhinitis Mother   ? Urticaria Mother   ? Healthy Father   ? Allergic rhinitis Sister   ? Eczema Sister   ? Anemia Sister   ? Hashimoto's thyroiditis Brother   ? ADD / ADHD Brother   ? Arthritis Maternal Grandmother   ? Irritable bowel syndrome Maternal Grandmother   ? Breast cancer Maternal Grandmother   ? Lupus Cousin   ? Hashimoto's thyroiditis Cousin   ? Lung cancer Other   ?     paternal great grandfather  ? Lung cancer Other   ?     maternal great grandfather  ? Pancreatic cancer Other   ?     great aunt  ? Colon cancer Neg Hx   ? ? ?Social History ?Social History  ? ?Tobacco Use  ? Smoking status: Former  ?  Packs/day: 0.25  ?  Years: 6.00  ?  Pack years: 1.50  ?  Types: Cigarettes  ?  Quit date: 2018  ?  Years since quitting: 5.2  ? Smokeless tobacco: Never  ?Vaping Use  ? Vaping Use: Some days  ?Substance Use Topics  ? Alcohol use: Yes  ?  Comment: Rarely  ? Drug use: No  ? ? ? ?Allergies   ?Aspirin, Amlodipine, Amoxicillin, Hyoscyamine, Mixed grasses, Novocain [procaine], Other, Paxil [paroxetine hcl], and Penicillins ? ? ?Review of Systems ?Review of Systems  ?HENT:  Positive for congestion.   ?     Bilateral ear fullness for 2 days  ?Respiratory:  Positive for cough.   ?All other systems reviewed and are negative. ? ? ?Physical Exam ?Triage Vital Signs ?ED Triage  Vitals  ?Enc Vitals Group  ?   BP 02/10/22 0959 129/86  ?   Pulse Rate 02/10/22 0959 73  ?   Resp 02/10/22 0959 20  ?   Temp 02/10/22 0959 99 ?F (37.2 ?C)  ?   Temp src --   ?   SpO2 02/10/22 0959 99 %  ?   Weight 02/10/22 0953 132 lb (59.9 kg)  ?   Height 02/10/22 0953 5\' 5"  (1.651 m)  ?   Head Circumference --   ?   Peak Flow --   ?  Pain Score 02/10/22 0953 0  ?   Pain Loc --   ?   Pain Edu? --   ?   Excl. in GC? --   ? ?No data found. ? ?Updated Vital Signs ?BP 129/86   Pulse 73   Temp 99 ?F (37.2 ?C)   Resp 20   Ht 5\' 5"  (1.651 m)   Wt 132 lb (59.9 kg)   LMP 01/22/2022   SpO2 99%   BMI 21.97 kg/m?  ? ? ?Physical Exam ?Vitals and nursing note reviewed.  ?Constitutional:   ?   General: She is not in acute distress. ?   Appearance: Normal appearance. She is normal weight. She is not ill-appearing.  ?HENT:  ?   Head: Normocephalic and atraumatic.  ?   Right Ear: Tympanic membrane, ear canal and external ear normal.  ?   Left Ear: Tympanic membrane, ear canal and external ear normal.  ?   Nose:  ?   Right Sinus: Maxillary sinus tenderness present.  ?   Left Sinus: Maxillary sinus tenderness present.  ?   Comments: Turbinates are erythematous/edematous ?   Mouth/Throat:  ?   Mouth: Mucous membranes are moist.  ?   Pharynx: Oropharynx is clear.  ?   Comments: Mild to moderate amount of clear drainage noted of posterior oropharynx ?Eyes:  ?   Extraocular Movements: Extraocular movements intact.  ?   Conjunctiva/sclera: Conjunctivae normal.  ?   Pupils: Pupils are equal, round, and reactive to light.  ?Cardiovascular:  ?   Rate and Rhythm: Normal rate and regular rhythm.  ?   Pulses: Normal pulses.  ?   Heart sounds: Normal heart sounds.  ?Pulmonary:  ?   Effort: Pulmonary effort is normal.  ?   Breath sounds: Normal breath sounds. No wheezing, rhonchi or rales.  ?Musculoskeletal:  ?   Cervical back: Normal range of motion and neck supple.  ?Skin: ?   General: Skin is warm and dry.  ?Neurological:  ?   General:  No focal deficit present.  ?   Mental Status: She is alert and oriented to person, place, and time.  ? ? ? ?UC Treatments / Results  ?Labs ?(all labs ordered are listed, but only abnormal results are displayed) ?Labs Revi

## 2022-02-11 ENCOUNTER — Telehealth: Payer: Self-pay | Admitting: *Deleted

## 2022-02-11 NOTE — Telephone Encounter (Signed)
-----   Message from Trula Slade, DPM sent at 01/23/2022  3:07 PM EDT ----- ?Can you please fax the nerve biopsy results to her new rheumatologist Dr. Hermelinda Medicus, MD ph: [phone]? Thanks! ? ?

## 2022-02-16 ENCOUNTER — Ambulatory Visit: Payer: 59 | Admitting: Allergy

## 2022-02-22 ENCOUNTER — Telehealth: Payer: Self-pay | Admitting: *Deleted

## 2022-02-22 NOTE — Telephone Encounter (Signed)
Faxed [place] nerve biopsy to Dr Gunnar Fusi Ziolkowska's office, received confirmation 02/22/22. ?

## 2022-02-24 ENCOUNTER — Telehealth: Payer: Self-pay | Admitting: Family Medicine

## 2022-02-24 DIAGNOSIS — F988 Other specified behavioral and emotional disorders with onset usually occurring in childhood and adolescence: Secondary | ICD-10-CM

## 2022-02-24 NOTE — Telephone Encounter (Signed)
Med refill 30 day supply ?Pt said pharmacy is saying they need a new prescription sent in. ? ?amphetamine-dextroamphetamine ?amphetamine-dextroamphetamine (ADDERALL) 10 MG tablet ? ? ?[id number] - [place], [place] - [address] AT [address] [facility] [address] Phone:  [phone]  ?Fax:  [phone]  ?  ? ? ? ?

## 2022-02-25 ENCOUNTER — Ambulatory Visit (INDEPENDENT_AMBULATORY_CARE_PROVIDER_SITE_OTHER): Payer: 59

## 2022-02-25 DIAGNOSIS — J309 Allergic rhinitis, unspecified: Secondary | ICD-10-CM

## 2022-02-25 MED ORDER — AMPHETAMINE-DEXTROAMPHETAMINE 10 MG PO TABS: 10.0000 mg | ORAL_TABLET | Freq: Two times a day (BID) | ORAL | 0 refills | Status: DC

## 2022-02-25 NOTE — Telephone Encounter (Signed)
Prescribed 90d supply ?

## 2022-02-25 NOTE — Telephone Encounter (Signed)
Called pharmacy who confirmed that pt picked up 30 d/s and will need a new script for add'l 2 refills. Spoke with pt who agreed to have the 2 [facility].  ?

## 2022-02-25 NOTE — Telephone Encounter (Signed)
Spoke with pt regarding medication pt stated that she picked up 30 d/s and had 2 refills but wasn't allowed to refill script per pharmacy.  ?

## 2022-02-25 NOTE — Telephone Encounter (Signed)
Noted  

## 2022-03-10 ENCOUNTER — Ambulatory Visit (INDEPENDENT_AMBULATORY_CARE_PROVIDER_SITE_OTHER): Payer: 59

## 2022-03-10 DIAGNOSIS — J309 Allergic rhinitis, unspecified: Secondary | ICD-10-CM | POA: Diagnosis not present

## 2022-03-16 ENCOUNTER — Ambulatory Visit: Payer: 59 | Admitting: Family Medicine

## 2022-03-17 ENCOUNTER — Ambulatory Visit (INDEPENDENT_AMBULATORY_CARE_PROVIDER_SITE_OTHER): Payer: 59

## 2022-03-17 DIAGNOSIS — J309 Allergic rhinitis, unspecified: Secondary | ICD-10-CM

## 2022-03-24 ENCOUNTER — Ambulatory Visit (INDEPENDENT_AMBULATORY_CARE_PROVIDER_SITE_OTHER): Payer: 59

## 2022-03-24 DIAGNOSIS — J309 Allergic rhinitis, unspecified: Secondary | ICD-10-CM

## 2022-03-30 ENCOUNTER — Ambulatory Visit: Payer: Self-pay | Admitting: Allergy

## 2022-04-05 NOTE — Progress Notes (Unsigned)
Follow Up Note  RE: JAELENE DECH MRN: [id number] DOB: 1991/08/28 Date of Office Visit: 04/06/2022  Referring provider: [name], DO Primary care provider: [name], DO  Chief Complaint: No chief complaint on file.  History of Present Illness: I had the pleasure of seeing Willard Nordell for a follow up visit at the [facility] on 04/05/2022. She is a 31 y.o. female, who is being followed for allergic rhinoconjunctivitis on AIT, history of frequent URIs, drug reaction. Her previous allergy office visit was on 08/13/2021 with Dr. Maudie Mercury. Today is a regular follow up visit.  Seasonal and perennial allergic rhinoconjunctivitis Past history - Rhino conjunctivitis symptoms from the spring through fall for the past 5 years. 2 sinus infections per year. 2021 skin testing showed: Positive to grass, weed, ragweed, trees, cat, mold, dog, dust mites. Borderline to cockroach.  Negative to common foods. Started AIT on 05/20/2020 (G-W-RW-T and M-C-D-CR-DM) Interim history -  Localized reactions with injections improved. Does not tolerate nasal sprays. Continue allergy injections - given today. If you have a reactions next time let us know  Continue environmental control measures as below. Use over the counter antihistamines such as Zyrtec (cetirizine), Claritin (loratadine), Allegra (fexofenadine), or Xyzal (levocetirizine) daily as needed. May take twice a day during allergy flares. May switch antihistamines every few months. Continue with Singulair (montelukast) 10mg  daily at night. May use olopatadine eye drops 0.1% twice a day as needed for itchy/watery eyes. Nasal saline spray (i.e., Simply Saline) or nasal saline lavage (i.e., NeilMed) is recommended as needed and prior to medicated nasal sprays.   History of frequent upper respiratory infection Past history - Notes frequent sinus infections needing frequent antibiotics. 2022 bloodwork - normal immunoglobulin levels,  protective titers for tetanus/diptheria, pneumococcal titers were adequate but not robust. Interim history - no antibiotics since last visit.  Keep track of infections/antibiotics use.   Drug reaction Past history - Broke out in hives after taking penicillin. Adverse reaction to aspirin - most likely non-IgE mediated. Tolerates other NSAIDs. Consider penicillin testing in future - next winter when able to come off antihistamines for 3 days. Continue to avoid aspirin only. Okay to take other NSAIDs.   Return in about 6 months (around 02/10/2022).  Assessment and Plan: Adelise is a 31 y.o. female with: No problem-specific Assessment & Plan notes found for this encounter.  No follow-ups on file.  No orders of the defined types were placed in this encounter.  Lab Orders  No laboratory test(s) ordered today    Diagnostics: Spirometry:  Tracings reviewed. Her effort: {Blank single:19197::"Good reproducible efforts.","It was hard to get consistent efforts and there is a question as to whether this reflects a maximal maneuver.","Poor effort, data can not be interpreted."} FVC: ***L FEV1: ***L, ***% predicted FEV1/FVC ratio: ***% Interpretation: {Blank single:19197::"Spirometry consistent with mild obstructive disease","Spirometry consistent with moderate obstructive disease","Spirometry consistent with severe obstructive disease","Spirometry consistent with possible restrictive disease","Spirometry consistent with mixed obstructive and restrictive disease","Spirometry uninterpretable due to technique","Spirometry consistent with normal pattern","No overt abnormalities noted given today's efforts"}.  Please see scanned spirometry results for details.  Skin Testing: {Blank single:19197::"Select foods","Environmental allergy panel","Environmental allergy panel and select foods","Food allergy panel","None","Deferred due to recent antihistamines use"}. *** Results discussed with  patient/family.   Medication List:  Current Outpatient Medications  Medication Sig Dispense Refill  . amphetamine-dextroamphetamine (ADDERALL) 10 MG tablet Take 1 tablet (10 mg total) by mouth 2 (two) times daily. 180 tablet 0  .  amphetamine-dextroamphetamine (ADDERALL) 10 MG tablet Take 1 tablet (10 mg total) by mouth 2 (two) times daily. 180 tablet 0  . ascorbic acid (VITAMIN C) 500 MG tablet Take by mouth.    . Cyanocobalamin (B-12) 1000 MCG TABS Take 2 tablets by mouth daily.    Marland Kitchen dextromethorphan (DELSYM) 30 MG/5ML liquid Take by mouth as needed for cough.    . dicyclomine (BENTYL) 10 MG capsule TAKE 1 CAPSULE BY MOUTH FOUR TIMES DAILY BEFORE MEALS AND AT BEDTIME PRN 120 capsule 3  . diphenoxylate-atropine (LOMOTIL) 2.5-0.025 MG tablet TAKE 1 TABLET BY MOUTH FOUR TIMES DAILY AS NEEDED FOR DIARRHEA OR LOOSE STOOLS 180 tablet 1  . drospirenone-ethinyl estradiol (YAZ,GIANVI,LORYNA) 3-0.02 MG tablet Take 1 tablet by mouth daily.    Marland Kitchen EPINEPHrine (AUVI-Q) 0.3 mg/0.3 mL IJ SOAJ injection Inject 0.3 mg into the muscle as needed for anaphylaxis. 1 each 1  . ibuprofen (ADVIL) 200 MG tablet Take 200 mg by mouth every 6 (six) hours as needed.    Marland Kitchen levocetirizine (XYZAL) 5 MG tablet TAKE 1 TABLET(5 MG) BY MOUTH EVERY EVENING 90 tablet 1  . Magnesium 100 MG TABS Take by mouth. W/ calcium and zinc    . montelukast (SINGULAIR) 10 MG tablet Take 1 tablet (10 mg total) by mouth at bedtime. 90 tablet 1  . Multiple Vitamins-Minerals (MULTIVITAMIN WITH MINERALS) tablet Take 1 tablet by mouth daily.    Marland Kitchen nystatin-triamcinolone (MYCOLOG II) cream SMARTSIG:Sparingly Topical 1 to 2 Times Daily PRN    . olopatadine (PATANOL) 0.1 % ophthalmic solution Place 1 drop into both eyes 2 (two) times daily as needed (itchy/watery eyes). 5 mL 5  . ondansetron (ZOFRAN) 4 MG tablet Take 1 tablet (4 mg total) by mouth every 8 (eight) hours as needed for nausea or vomiting. 20 tablet 0  . Vitamin D, Cholecalciferol, 25 MCG  (1000 UT) CAPS Take by mouth.     No current facility-administered medications for this visit.   Allergies: Allergies  Allergen Reactions  . Aspirin Anaphylaxis  . Amlodipine Other (See Comments)    Per patient burning sensation through body  . Amoxicillin Hives  . Hyoscyamine Other (See Comments)  . Mixed Grasses   . Novocain [Procaine] Swelling  . Other      grass, weed, ragweed, trees, cat, mold, dog, dust mites. Borderline to cockroach.    . Paxil [Paroxetine Hcl]     Dizziness   . Penicillins     REACTION: hives   I reviewed her past medical history, social history, family history, and environmental history and no significant changes have been reported from her previous visit.  Review of Systems  Constitutional:  Negative for appetite change, chills, fever and unexpected weight change.  HENT:  Positive for congestion, postnasal drip, rhinorrhea and sneezing.   Eyes:  Positive for itching.  Respiratory:  Negative for cough, chest tightness, shortness of breath and wheezing.   Cardiovascular:  Negative for chest pain.  Gastrointestinal:  Negative for abdominal pain.  Genitourinary:  Negative for difficulty urinating.  Skin:  Negative for rash.  Allergic/Immunologic: Positive for environmental allergies. Negative for food allergies.  Neurological:  Negative for headaches.   Objective: There were no vitals taken for this visit. There is no height or weight on file to calculate BMI. Physical Exam Vitals and nursing note reviewed.  Constitutional:      Appearance: Normal appearance. She is well-developed and normal weight.  HENT:     Head: Normocephalic and atraumatic.  Right Ear: Tympanic membrane and external ear normal.     Left Ear: Tympanic membrane and external ear normal.     Nose: Nose normal.     Mouth/Throat:     Mouth: Mucous membranes are moist.     Pharynx: Oropharynx is clear.  Eyes:     Conjunctiva/sclera: Conjunctivae normal.  Cardiovascular:      Rate and Rhythm: Normal rate and regular rhythm.     Heart sounds: Normal heart sounds. No murmur heard.   No friction rub. No gallop.  Pulmonary:     Effort: Pulmonary effort is normal.     Breath sounds: Normal breath sounds. No wheezing, rhonchi or rales.  Abdominal:     Palpations: Abdomen is soft.  Musculoskeletal:     Cervical back: Neck supple.  Skin:    General: Skin is warm.     Findings: No rash.  Neurological:     Mental Status: She is alert and oriented to person, place, and time.  Psychiatric:        Mood and Affect: Mood normal.        Behavior: Behavior normal.  Previous notes and tests were reviewed. The plan was reviewed with the patient/family, and all questions/concerned were addressed.  It was my pleasure to see Janelli today and participate in her care. Please feel free to contact me with any questions or concerns.  Sincerely,  Rexene Alberts, DO Allergy & Immunology  [facility] office: [place] office: [phone]

## 2022-04-06 ENCOUNTER — Ambulatory Visit (INDEPENDENT_AMBULATORY_CARE_PROVIDER_SITE_OTHER): Payer: 59 | Admitting: Allergy

## 2022-04-06 ENCOUNTER — Encounter: Payer: Self-pay | Admitting: Allergy

## 2022-04-06 VITALS — BP 118/72 | HR 86 | Temp 97.8°F | Resp 16 | Ht 65.0 in | Wt 137.1 lb

## 2022-04-06 DIAGNOSIS — H1013 Acute atopic conjunctivitis, bilateral: Secondary | ICD-10-CM | POA: Diagnosis not present

## 2022-04-06 DIAGNOSIS — H1010 Acute atopic conjunctivitis, unspecified eye: Secondary | ICD-10-CM

## 2022-04-06 DIAGNOSIS — T50905D Adverse effect of unspecified drugs, medicaments and biological substances, subsequent encounter: Secondary | ICD-10-CM

## 2022-04-06 DIAGNOSIS — J309 Allergic rhinitis, unspecified: Secondary | ICD-10-CM

## 2022-04-06 DIAGNOSIS — Z8709 Personal history of other diseases of the respiratory system: Secondary | ICD-10-CM

## 2022-04-06 MED ORDER — CARBINOXAMINE MALEATE 4 MG PO TABS: ORAL_TABLET | ORAL | 2 refills | Status: DC

## 2022-04-06 NOTE — Assessment & Plan Note (Signed)
Past history - Rhino conjunctivitis symptoms from the spring through fall for the past 5 years. 2 sinus infections per year. 2021 skin testing showed: Positive to grass, weed, ragweed, trees, cat, mold, dog, dust mites. Borderline to cockroach.  Negative to common foods. Started AIT on 05/20/2020 (G-W-RW-T and M-C-D-CR-DM) Interim history -  Localized reactions with injections still. Epistaxis with nasal sprays.   Continue allergy injections - given today, add epi rinse.   Keep track of the localized reactions.   Continue environmental control measures.  Take Carbinoxamine 1 to 1.5 tablets (4-6mg ) twice a day.  This replaces Xyzal and Claritin.  Let me know if not covered. . Continue with Singulair (montelukast) 10mg  daily at night.  May use olopatadine eye drops 0.1% twice a day as needed for itchy/watery eyes.  Nasal saline spray (i.e., Simply Saline) or nasal saline lavage (i.e., NeilMed) is recommended as needed and prior to medicated nasal sprays.

## 2022-04-06 NOTE — Assessment & Plan Note (Signed)
Past history - Broke out in hives after taking penicillin. Adverse reaction to aspirin - most likely non-IgE mediated. Tolerates other NSAIDs.  Consider penicillin testing in future - next winter when able to come off antihistamines for 3 days. . Continue to avoid aspirin only. Okay to take other NSAIDs. 

## 2022-04-06 NOTE — Patient Instructions (Signed)
Environmental allergies 2021 skin testing was Positive to grass, weed, ragweed, trees, cat, mold, dog, dust mites. Borderline to cockroach.  Continue allergy injections.  Keep track of the localized reactions.  Continue nvironmental control measures. Take Carbinoxamine 1 to 1.5 tablets (4-6mg ) twice a day. This replaces Xyzal and Claritin. Let me know if not covered. Continue with Singulair (montelukast) 10mg  daily at night. May use olopatadine eye drops 0.1% twice a day as needed for itchy/watery eyes. Nasal saline spray (i.e., Simply Saline) or nasal saline lavage (i.e., NeilMed) is recommended as needed and prior to medicated nasal sprays.  Sinus infections Keep track of infections/antibiotics. May need to repeat some bloodwork at next visit.   Aspirin Continue to avoid for now.  Penicillin allergy Consider penicillin skin testing/drug challenge in the future - maybe next winter.  More than 90% of patients outgrow their penicillin allergy.   Other Minimize/avoid contact with perm solutions.  Follow up in 6 months or sooner if needed.

## 2022-04-06 NOTE — Assessment & Plan Note (Signed)
Past history - Notes frequent sinus infections needing frequent antibiotics. 2022 bloodwork - normal immunoglobulin levels, protective titers for tetanus/diptheria, pneumococcal titers were adequate but not robust. Interim history - 1 antibiotics this year.   Keep track of infections/antibiotics use.  May need to repeat some bloodwork at next visit.

## 2022-04-13 DIAGNOSIS — J3089 Other allergic rhinitis: Secondary | ICD-10-CM

## 2022-04-13 NOTE — Progress Notes (Signed)
VIALS EXP 04-14-23 

## 2022-04-22 ENCOUNTER — Ambulatory Visit (INDEPENDENT_AMBULATORY_CARE_PROVIDER_SITE_OTHER): Payer: 59

## 2022-04-22 DIAGNOSIS — J309 Allergic rhinitis, unspecified: Secondary | ICD-10-CM

## 2022-05-03 ENCOUNTER — Ambulatory Visit (INDEPENDENT_AMBULATORY_CARE_PROVIDER_SITE_OTHER): Payer: 59

## 2022-05-03 DIAGNOSIS — J309 Allergic rhinitis, unspecified: Secondary | ICD-10-CM

## 2022-05-13 DIAGNOSIS — R768 Other specified abnormal immunological findings in serum: Secondary | ICD-10-CM | POA: Diagnosis not present

## 2022-05-13 DIAGNOSIS — M359 Systemic involvement of connective tissue, unspecified: Secondary | ICD-10-CM | POA: Diagnosis not present

## 2022-05-19 ENCOUNTER — Ambulatory Visit (INDEPENDENT_AMBULATORY_CARE_PROVIDER_SITE_OTHER): Payer: 59

## 2022-05-19 DIAGNOSIS — J309 Allergic rhinitis, unspecified: Secondary | ICD-10-CM

## 2022-05-25 ENCOUNTER — Ambulatory Visit (INDEPENDENT_AMBULATORY_CARE_PROVIDER_SITE_OTHER): Payer: 59 | Admitting: Family Medicine

## 2022-05-25 ENCOUNTER — Encounter: Payer: Self-pay | Admitting: Family Medicine

## 2022-05-25 VITALS — BP 137/82 | HR 87 | Temp 98.1°F | Ht 65.0 in | Wt 137.0 lb

## 2022-05-25 DIAGNOSIS — R768 Other specified abnormal immunological findings in serum: Secondary | ICD-10-CM

## 2022-05-25 DIAGNOSIS — F988 Other specified behavioral and emotional disorders with onset usually occurring in childhood and adolescence: Secondary | ICD-10-CM | POA: Diagnosis not present

## 2022-05-25 DIAGNOSIS — R21 Rash and other nonspecific skin eruption: Secondary | ICD-10-CM | POA: Diagnosis not present

## 2022-05-25 DIAGNOSIS — Z87820 Personal history of traumatic brain injury: Secondary | ICD-10-CM | POA: Diagnosis not present

## 2022-05-25 DIAGNOSIS — K580 Irritable bowel syndrome with diarrhea: Secondary | ICD-10-CM

## 2022-05-25 DIAGNOSIS — R69 Illness, unspecified: Secondary | ICD-10-CM | POA: Diagnosis not present

## 2022-05-25 MED ORDER — AMPHETAMINE-DEXTROAMPHETAMINE 10 MG PO TABS: 10.0000 mg | ORAL_TABLET | Freq: Two times a day (BID) | ORAL | 0 refills | Status: DC

## 2022-05-25 MED ORDER — FLUOCINONIDE 0.05 % EX CREA: 1.0000 | TOPICAL_CREAM | Freq: Two times a day (BID) | CUTANEOUS | 5 refills | Status: AC

## 2022-05-25 MED ORDER — DIPHENOXYLATE-ATROPINE 2.5-0.025 MG PO TABS: ORAL_TABLET | ORAL | 1 refills | Status: DC

## 2022-05-25 NOTE — Patient Instructions (Signed)
Return in about 5 months (around 10/17/2022) for cpe (20 min), Routine chronic condition follow-up.        Great to see you today.  I have refilled the medication(s) we provide.   If labs were collected, we will inform you of lab results once received either by echart message or telephone call.   - echart message- for normal results that have been seen by the patient already.   - telephone call: abnormal results or if patient has not viewed results in their echart.

## 2022-05-25 NOTE — Progress Notes (Signed)
Suzanne Jarvis , 05/16/1991, 31 y.o., female MRN: [id number] Patient Care Team    Relationship Specialty Notifications Start End  Suzanne Leatherwood, DO PCP - General Family Medicine  09/04/15   Suzanne Perna, NP (Inactive) Nurse Practitioner Obstetrics and Gynecology  01/29/20   Suzanne Jarvis, DPM Consulting Physician Podiatry  01/03/22     Chief Complaint  Patient presents with   ADD     Subjective:Suzanne Jarvis is a 31 y.o.  pt present for an OV for chronic medical conditions  Attention deficit:  pt present to follow-up with her attention deficit status post concussion.  Patient reports  compliance Adderall to 10 mg twice daily is working very well for her.  At this dose she is able to remain productive s/p concussion.  Prior note: Status post concussion and sequela of insomnia and focus deficits.  She has been evaluated and followed by sports medicine for this condition.  Her condition is felt to be stable and is now referred back to primary care to take over management.  Patient reports she is sleeping well on trazodone 50 mg nightly.  She also feels her focus is much improved on Adderall 20 mg XR.  She is uncertain if she needs this high of a dose.  She also would like to hopefully not have to stay on trazodone or Adderall long-term.  She does admit they are both helpful with her condition.    Irritable bowel syndrome with diarrhea Patient reports her irritable bowel syndrome is well controlled  on Lomotil and Bentyl as needed.  She only takes this medication when she is going to be away from home and consuming meals.   She rarely requires lomotil use, but does need refills.     09/29/2021    9:23 AM 10/14/2020   10:36 AM 01/29/2020   10:58 AM 11/20/2019    9:38 AM  Depression screen PHQ 2/9  Decreased Interest 0 0 0 0  Down, Depressed, Hopeless 0 0 0 0  PHQ - 2 Score 0 0 0 0    Allergies  Allergen Reactions   Aspirin Anaphylaxis   Amlodipine Other (See Comments)     Per patient burning sensation through body   Amoxicillin Hives   Hyoscyamine Other (See Comments)   Mixed Grasses    Novocain [Procaine] Swelling   Other      grass, weed, ragweed, trees, cat, mold, dog, dust mites. Borderline to cockroach.     Paxil [Paroxetine Hcl]     Dizziness    Penicillins     REACTION: hives   Social History   Social History Narrative   Right Handed    Lives in a two story condo on the second floor    Goes to the gyn 3 times a week.      Has a Allena Earing that she called her son    Past Medical History:  Diagnosis Date   ADD (attention deficit disorder) 07/08/2020   Angio-edema    ASCUS with positive high risk HPV cervical 03/26/2019   01/2019; negative colposcopy 03/2019; plan repeat pap and HPV test 01/2020   Asthma    Chronic headaches    Concussion with no loss of consciousness 07/08/2020   May 2021   Eczema    IBS (irritable bowel syndrome)    Penicillin allergy 04/28/2020   Seasonal and perennial allergic rhinoconjunctivitis 07/28/2020   Tonsillitis    Urticaria    Past Surgical History:  Procedure Laterality Date   WISDOM TOOTH EXTRACTION     Family History  Problem Relation Age of Onset   Allergies Mother    Asthma Mother    Hypertension Mother    Allergic rhinitis Mother    Urticaria Mother    Healthy Father    Allergic rhinitis Sister    Eczema Sister    Anemia Sister    Hashimoto's thyroiditis Brother    ADD / ADHD Brother    Arthritis Maternal Grandmother    Irritable bowel syndrome Maternal Grandmother    Breast cancer Maternal Grandmother    Lupus Cousin    Hashimoto's thyroiditis Cousin    Lung cancer Other        paternal great grandfather   Lung cancer Other        maternal great grandfather   Pancreatic cancer Other        great aunt   Colon cancer Neg Hx    Allergies as of 05/25/2022       Reactions   Aspirin Anaphylaxis   Amlodipine Other (See Comments)   Per patient burning sensation through body    Amoxicillin Hives   Hyoscyamine Other (See Comments)   Mixed Grasses    Novocain [procaine] Swelling   Other     grass, weed, ragweed, trees, cat, mold, dog, dust mites. Borderline to cockroach.     Paxil [paroxetine Hcl]    Dizziness   Penicillins    REACTION: hives        Medication List        Accurate as of May 25, 2022 10:14 AM. If you have any questions, ask your nurse or doctor.          STOP taking these medications    Carbinoxamine Maleate 4 MG Tabs Stopped by: Felix Pacini, DO   ibuprofen 200 MG tablet Commonly known as: ADVIL Stopped by: Felix Pacini, DO   nystatin-triamcinolone cream Commonly known as: MYCOLOG II Stopped by: Felix Pacini, DO   ondansetron 4 MG tablet Commonly known as: ZOFRAN Stopped by: Felix Pacini, DO       TAKE these medications    amphetamine-dextroamphetamine 10 MG tablet Commonly known as: Adderall Take 1 tablet (10 mg total) by mouth 2 (two) times daily.   amphetamine-dextroamphetamine 10 MG tablet Commonly known as: ADDERALL Take 1 tablet (10 mg total) by mouth 2 (two) times daily.   ascorbic acid 500 MG tablet Commonly known as: VITAMIN C Take by mouth.   B-12 1000 MCG Tabs Take 2 tablets by mouth daily.   colchicine 0.6 MG tablet Take 0.6 mg by mouth as needed.   dextromethorphan 30 MG/5ML liquid Commonly known as: DELSYM Take by mouth as needed for cough.   dicyclomine 10 MG capsule Commonly known as: BENTYL TAKE 1 CAPSULE BY MOUTH FOUR TIMES DAILY BEFORE MEALS AND AT BEDTIME PRN   diphenoxylate-atropine 2.5-0.025 MG tablet Commonly known as: LOMOTIL TAKE 1 TABLET BY MOUTH FOUR TIMES DAILY AS NEEDED FOR DIARRHEA OR LOOSE STOOLS   drospirenone-ethinyl estradiol 3-0.02 MG tablet Commonly known as: YAZ Take 1 tablet by mouth daily.   EPINEPHrine 0.3 mg/0.3 mL Soaj injection Commonly known as: Auvi-Q Inject 0.3 mg into the muscle as needed for anaphylaxis.   fluocinonide cream 0.05 % Commonly  known as: LIDEX Apply 1 Application topically 2 (two) times daily. As need for rash Started by: Felix Pacini, DO   levocetirizine 5 MG tablet Commonly known as: XYZAL TAKE 1 TABLET(5 MG) BY MOUTH  EVERY EVENING   Magnesium 100 MG Tabs Take by mouth. W/ calcium and zinc   montelukast 10 MG tablet Commonly known as: SINGULAIR Take 1 tablet (10 mg total) by mouth at bedtime.   multivitamin with minerals tablet Take 1 tablet by mouth daily.   olopatadine 0.1 % ophthalmic solution Commonly known as: Patanol Place 1 drop into both eyes 2 (two) times daily as needed (itchy/watery eyes).   Vitamin D (Cholecalciferol) 25 MCG (1000 UT) Caps Take by mouth.        All past medical history, surgical history, allergies, family history, immunizations andmedications were updated in the EMR today and reviewed under the history and medication portions of their EMR.     ROS: Negative, with the exception of above mentioned in HPI   Objective:  BP 137/82   Pulse 87   Temp 98.1 F (36.7 C)   Ht 5\' 5"  (1.651 m)   Wt 137 lb (62.1 kg)   SpO2 100%   BMI 22.80 kg/m  Body mass index is 22.8 kg/m. Physical Exam Vitals and nursing note reviewed.  Constitutional:      General: She is not in acute distress.    Appearance: Normal appearance. She is not ill-appearing, toxic-appearing or diaphoretic.  HENT:     Head: Normocephalic and atraumatic.  Eyes:     General: No scleral icterus.       Right eye: No discharge.        Left eye: No discharge.     Extraocular Movements: Extraocular movements intact.     Conjunctiva/sclera: Conjunctivae normal.     Pupils: Pupils are equal, round, and reactive to light.  Cardiovascular:     Rate and Rhythm: Normal rate and regular rhythm.  Pulmonary:     Effort: Pulmonary effort is normal. No respiratory distress.     Breath sounds: Normal breath sounds. No wheezing, rhonchi or rales.  Musculoskeletal:     Cervical back: Neck supple. No tenderness.   Lymphadenopathy:     Cervical: No cervical adenopathy.  Skin:    General: Skin is warm and dry.     Coloration: Skin is not jaundiced or pale.     Findings: Rash (b/l lumbar (pic), b/l ACfossa and hands) present. No erythema.  Neurological:     Mental Status: She is alert and oriented to person, place, and time. Mental status is at baseline.     Motor: No weakness.     Gait: Gait normal.  Psychiatric:        Mood and Affect: Mood normal.        Behavior: Behavior normal.        Thought Content: Thought content normal.        Judgment: Judgment normal.     No results found. No results found. No results found for this or any previous visit (from the past 24 hour(s)).  Assessment/Plan: MYEASHA HOOPES is a 31 y.o. female present for OV for  Attention deficit disorder (ADD) without hyperactivity/Controlled substance agreement signed/history of concussion with focus deficit Stable  Continue  Adderall to 10 mg twice dail (#90 d scripts x2 provided) [place] controlled substance database reviewed 05/25/22 Controlled substance agreement signed  UDS UTD Follow-up 5.5 months (CPE combo)  Irritable bowel syndrome with diarrhea Stable  continue  Lomotil as needed Continue Bentyl as needed  Seasonal allergies Following with allergist.   Extremity swelling and pain/cold extremities/rash: Following with rheumatology> suspect lupus or other CT disorder. Rash today consistent  with poss. Dermatomyositis rash. > lidex prescribed.  Est Neuro- 12/17/2021  Reviewed expectations re: course of current medical issues. Discussed self-management of symptoms. Outlined signs and symptoms indicating need for more acute intervention. Patient verbalized understanding and all questions were answered. Patient received an After-Visit Summary.   No orders of the defined types were placed in this encounter.   Meds ordered this encounter  Medications   diphenoxylate-atropine (LOMOTIL)  2.5-0.025 MG tablet    Sig: TAKE 1 TABLET BY MOUTH FOUR TIMES DAILY AS NEEDED FOR DIARRHEA OR LOOSE STOOLS    Dispense:  180 tablet    Refill:  1   amphetamine-dextroamphetamine (ADDERALL) 10 MG tablet    Sig: Take 1 tablet (10 mg total) by mouth 2 (two) times daily.    Dispense:  180 tablet    Refill:  0   amphetamine-dextroamphetamine (ADDERALL) 10 MG tablet    Sig: Take 1 tablet (10 mg total) by mouth 2 (two) times daily.    Dispense:  180 tablet    Refill:  0    May fill ~85 days after prescribed date   fluocinonide cream (LIDEX) 0.05 %    Sig: Apply 1 Application topically 2 (two) times daily. As need for rash    Dispense:  30 g    Refill:  5    Referral Orders  No referral(s) requested today    Note is dictated utilizing voice recognition software. Although note has been proof read prior to signing, occasional typographical errors still can be missed. If any questions arise, please do not hesitate to call for verification.   electronically signed by:  Howard Pouch, DO  [place]

## 2022-06-15 ENCOUNTER — Other Ambulatory Visit: Payer: Self-pay | Admitting: Allergy

## 2022-06-23 ENCOUNTER — Ambulatory Visit (INDEPENDENT_AMBULATORY_CARE_PROVIDER_SITE_OTHER): Payer: 59

## 2022-06-23 DIAGNOSIS — J309 Allergic rhinitis, unspecified: Secondary | ICD-10-CM

## 2022-06-30 ENCOUNTER — Ambulatory Visit (INDEPENDENT_AMBULATORY_CARE_PROVIDER_SITE_OTHER): Payer: 59 | Admitting: *Deleted

## 2022-06-30 DIAGNOSIS — J309 Allergic rhinitis, unspecified: Secondary | ICD-10-CM | POA: Diagnosis not present

## 2022-07-05 ENCOUNTER — Ambulatory Visit (INDEPENDENT_AMBULATORY_CARE_PROVIDER_SITE_OTHER): Payer: 59 | Admitting: *Deleted

## 2022-07-05 DIAGNOSIS — J309 Allergic rhinitis, unspecified: Secondary | ICD-10-CM | POA: Diagnosis not present

## 2022-07-28 ENCOUNTER — Ambulatory Visit (INDEPENDENT_AMBULATORY_CARE_PROVIDER_SITE_OTHER): Payer: 59 | Admitting: *Deleted

## 2022-07-28 DIAGNOSIS — J309 Allergic rhinitis, unspecified: Secondary | ICD-10-CM | POA: Diagnosis not present

## 2022-09-20 NOTE — Progress Notes (Unsigned)
Follow Up Note  RE: Suzanne Jarvis MRN: [id number] DOB: 07-13-1991 Date of Office Visit: 09/21/2022  Referring provider: Natalia Leatherwood, DO Primary care provider: Natalia Leatherwood, DO  Chief Complaint: No chief complaint on file.  History of Present Illness: I had the pleasure of seeing Suzanne Jarvis for a follow up visit at the [facility] on 09/20/2022. She is a 31 y.o. female, who is being followed for allergic rhinoconjunctivitis on AIT, history of frequent upper respiratory infection and multiple drug allergies. Her previous allergy office visit was on 04/06/2022 with Dr. Selena Batten. Today is a regular follow up visit.  Seasonal and perennial allergic rhinoconjunctivitis Past history - Rhino conjunctivitis symptoms from the spring through fall for the past 5 years. 2 sinus infections per year. 2021 skin testing showed: Positive to grass, weed, ragweed, trees, cat, mold, dog, dust mites. Borderline to cockroach.  Negative to common foods. Started AIT on 05/20/2020 (G-W-RW-T and M-C-D-CR-DM) Interim history -  Localized reactions with injections still. Epistaxis with nasal sprays.  Continue allergy injections - given today, add epi rinse.  Keep track of the localized reactions.  Continue environmental control measures. Take Carbinoxamine 1 to 1.5 tablets (4-6mg ) twice a day. This replaces Xyzal and Claritin. Let me know if not covered. Continue with Singulair (montelukast) 10mg  daily at night. May use olopatadine eye drops 0.1% twice a day as needed for itchy/watery eyes. Nasal saline spray (i.e., Simply Saline) or nasal saline lavage (i.e., NeilMed) is recommended as needed and prior to medicated nasal sprays.   History of frequent upper respiratory infection Past history - Notes frequent sinus infections needing frequent antibiotics. 2022 bloodwork - normal immunoglobulin levels, protective titers for tetanus/diptheria, pneumococcal titers were adequate but not  robust. Interim history - 1 antibiotics this year.  Keep track of infections/antibiotics use. May need to repeat some bloodwork at next visit.    Drug reaction Past history - Broke out in hives after taking penicillin. Adverse reaction to aspirin - most likely non-IgE mediated. Tolerates other NSAIDs. Consider penicillin testing in future - next winter when able to come off antihistamines for 3 days. Continue to avoid aspirin only. Okay to take other NSAIDs.   Return in about 6 months (around 10/07/2022).    Assessment and Plan: Suzanne Jarvis is a 31 y.o. female with: No problem-specific Assessment & Plan notes found for this encounter.  No follow-ups on file.  No orders of the defined types were placed in this encounter.  Lab Orders  No laboratory test(s) ordered today    Diagnostics: Spirometry:  Tracings reviewed. Her effort: {Blank single:19197::"Good reproducible efforts.","It was hard to get consistent efforts and there is a question as to whether this reflects a maximal maneuver.","Poor effort, data can not be interpreted."} FVC: ***L FEV1: ***L, ***% predicted FEV1/FVC ratio: ***% Interpretation: {Blank single:19197::"Spirometry consistent with mild obstructive disease","Spirometry consistent with moderate obstructive disease","Spirometry consistent with severe obstructive disease","Spirometry consistent with possible restrictive disease","Spirometry consistent with mixed obstructive and restrictive disease","Spirometry uninterpretable due to technique","Spirometry consistent with normal pattern","No overt abnormalities noted given today's efforts"}.  Please see scanned spirometry results for details.  Skin Testing: {Blank single:19197::"Select foods","Environmental allergy panel","Environmental allergy panel and select foods","Food allergy panel","None","Deferred due to recent antihistamines use"}. *** Results discussed with patient/family.   Medication List:  Current  Outpatient Medications  Medication Sig Dispense Refill  . amphetamine-dextroamphetamine (ADDERALL) 10 MG tablet Take 1 tablet (10 mg total) by mouth 2 (two) times daily. 180 tablet 0  .  amphetamine-dextroamphetamine (ADDERALL) 10 MG tablet Take 1 tablet (10 mg total) by mouth 2 (two) times daily. 180 tablet 0  . ascorbic acid (VITAMIN C) 500 MG tablet Take by mouth.    . colchicine 0.6 MG tablet Take 0.6 mg by mouth as needed.    . Cyanocobalamin (B-12) 1000 MCG TABS Take 2 tablets by mouth daily.    Marland Kitchen dextromethorphan (DELSYM) 30 MG/5ML liquid Take by mouth as needed for cough.    . dicyclomine (BENTYL) 10 MG capsule TAKE 1 CAPSULE BY MOUTH FOUR TIMES DAILY BEFORE MEALS AND AT BEDTIME PRN 120 capsule 3  . diphenoxylate-atropine (LOMOTIL) 2.5-0.025 MG tablet TAKE 1 TABLET BY MOUTH FOUR TIMES DAILY AS NEEDED FOR DIARRHEA OR LOOSE STOOLS 180 tablet 1  . drospirenone-ethinyl estradiol (YAZ,GIANVI,LORYNA) 3-0.02 MG tablet Take 1 tablet by mouth daily.    Marland Kitchen EPINEPHrine (AUVI-Q) 0.3 mg/0.3 mL IJ SOAJ injection Inject 0.3 mg into the muscle as needed for anaphylaxis. 1 each 1  . levocetirizine (XYZAL) 5 MG tablet TAKE ONE TABLET BY MOUTH EVERY EVENING 30 tablet 5  . Magnesium 100 MG TABS Take by mouth. W/ calcium and zinc    . montelukast (SINGULAIR) 10 MG tablet Take 1 tablet (10 mg total) by mouth at bedtime. 90 tablet 1  . Multiple Vitamins-Minerals (MULTIVITAMIN WITH MINERALS) tablet Take 1 tablet by mouth daily.    Marland Kitchen olopatadine (PATANOL) 0.1 % ophthalmic solution Place 1 drop into both eyes 2 (two) times daily as needed (itchy/watery eyes). 5 mL 5  . Vitamin D, Cholecalciferol, 25 MCG (1000 UT) CAPS Take by mouth.     No current facility-administered medications for this visit.   Allergies: Allergies  Allergen Reactions  . Aspirin Anaphylaxis  . Amlodipine Other (See Comments)    Per patient burning sensation through body  . Amoxicillin Hives  . Hyoscyamine Other (See Comments)  . Mixed  Grasses   . Novocain [Procaine] Swelling  . Other      grass, weed, ragweed, trees, cat, mold, dog, dust mites. Borderline to cockroach.    . Paxil [Paroxetine Hcl]     Dizziness   . Penicillins     REACTION: hives   I reviewed her past medical history, social history, family history, and environmental history and no significant changes have been reported from her previous visit.  Review of Systems  Constitutional:  Negative for appetite change, chills, fever and unexpected weight change.  HENT:  Positive for congestion, postnasal drip, rhinorrhea and sneezing.   Eyes:  Positive for itching.  Respiratory:  Negative for cough, chest tightness, shortness of breath and wheezing.   Cardiovascular:  Negative for chest pain.  Gastrointestinal:  Negative for abdominal pain.  Genitourinary:  Negative for difficulty urinating.  Skin:  Negative for rash.  Allergic/Immunologic: Positive for environmental allergies. Negative for food allergies.  Neurological:  Negative for headaches.   Objective: There were no vitals taken for this visit. There is no height or weight on file to calculate BMI. Physical Exam Vitals and nursing note reviewed.  Constitutional:      Appearance: Normal appearance. She is well-developed and normal weight.  HENT:     Head: Normocephalic and atraumatic.     Right Ear: Tympanic membrane and external ear normal.     Left Ear: Tympanic membrane and external ear normal.     Nose: Nose normal.     Mouth/Throat:     Mouth: Mucous membranes are moist.     Pharynx: Oropharynx is clear.  Eyes:     Conjunctiva/sclera: Conjunctivae normal.  Cardiovascular:     Rate and Rhythm: Normal rate and regular rhythm.     Heart sounds: Normal heart sounds. No murmur heard.    No friction rub. No gallop.  Pulmonary:     Effort: Pulmonary effort is normal.     Breath sounds: Normal breath sounds. No wheezing, rhonchi or rales.  Abdominal:     Palpations: Abdomen is soft.   Musculoskeletal:     Cervical back: Neck supple.  Skin:    General: Skin is warm.     Findings: No rash.  Neurological:     Mental Status: She is alert and oriented to person, place, and time.  Psychiatric:        Mood and Affect: Mood normal.        Behavior: Behavior normal.  Previous notes and tests were reviewed. The plan was reviewed with the patient/family, and all questions/concerned were addressed.  It was my pleasure to see Chealsey today and participate in her care. Please feel free to contact me with any questions or concerns.  Sincerely,  Rexene Alberts, DO Allergy & Immunology  [facility] office: [place] office: [phone]

## 2022-09-21 ENCOUNTER — Other Ambulatory Visit: Payer: Self-pay

## 2022-09-21 ENCOUNTER — Encounter: Payer: Self-pay | Admitting: Allergy

## 2022-09-21 ENCOUNTER — Ambulatory Visit (INDEPENDENT_AMBULATORY_CARE_PROVIDER_SITE_OTHER): Payer: 59 | Admitting: Allergy

## 2022-09-21 VITALS — BP 122/72 | HR 98 | Temp 98.6°F | Resp 20

## 2022-09-21 DIAGNOSIS — Z889 Allergy status to unspecified drugs, medicaments and biological substances status: Secondary | ICD-10-CM | POA: Diagnosis not present

## 2022-09-21 DIAGNOSIS — Z8709 Personal history of other diseases of the respiratory system: Secondary | ICD-10-CM

## 2022-09-21 DIAGNOSIS — J302 Other seasonal allergic rhinitis: Secondary | ICD-10-CM | POA: Diagnosis not present

## 2022-09-21 DIAGNOSIS — H1010 Acute atopic conjunctivitis, unspecified eye: Secondary | ICD-10-CM

## 2022-09-21 DIAGNOSIS — H1013 Acute atopic conjunctivitis, bilateral: Secondary | ICD-10-CM | POA: Diagnosis not present

## 2022-09-21 MED ORDER — LEVOCETIRIZINE DIHYDROCHLORIDE 5 MG PO TABS: 5.0000 mg | ORAL_TABLET | Freq: Every evening | ORAL | 3 refills | Status: DC

## 2022-09-21 MED ORDER — OLOPATADINE HCL 0.1 % OP SOLN: 1.0000 [drp] | Freq: Two times a day (BID) | OPHTHALMIC | 5 refills | Status: DC | PRN

## 2022-09-21 NOTE — Assessment & Plan Note (Addendum)
Past history - Broke out in hives after taking penicillin. Adverse reaction to aspirin - most likely non-IgE mediated. Tolerates other NSAIDs.  Consider penicillin skin testing/drug challenge in the future. You have to stop Xyzal and Claritin for 3 days and have to be in the office for 2-3 hours. More than 90% of patients outgrow their penicillin allergy. Avoid aspirin.

## 2022-09-21 NOTE — Assessment & Plan Note (Signed)
Past history - Rhino conjunctivitis symptoms from the spring through fall for the past 5 years. 2 sinus infections per year. 2021 skin testing showed: Positive to grass, weed, ragweed, trees, cat, mold, dog, dust mites. Borderline to cockroach.  Negative to common foods. Started AIT on 05/20/2020 (G-W-RW-T and M-C-D-CR-DM). Epistaxis with nasal sprays.  Interim history -  Carbinoxamine caused itching, stopped Singulair due to concerns about side effects, wants to stop AIT as they are painful. Stop allergy injections - paper signed. Continue environmental control measures. Take Xyzal and Claritin daily. May restart Singulair (montelukast) 10mg  daily at night. May use olopatadine eye drops 0.1% twice a day as needed for itchy/watery eyes. Use refresh eye drops. Nasal saline spray (i.e., Simply Saline) or nasal saline lavage (i.e., NeilMed) is recommended as needed and prior to medicated nasal sprays.

## 2022-09-21 NOTE — Assessment & Plan Note (Signed)
Past history - Notes frequent sinus infections needing frequent antibiotics. 2022 bloodwork - normal immunoglobulin levels, protective titers for tetanus/diptheria, pneumococcal titers were adequate but not robust. Interim history - no antibiotics.  Keep track of infections/antibiotics use.

## 2022-09-21 NOTE — Patient Instructions (Addendum)
Environmental allergies 2021 skin testing was Positive to grass, weed, ragweed, trees, cat, mold, dog, dust mites. Borderline to cockroach.  Stop allergy injections - paper signed. Continue nvironmental control measures. Take Xyzal and Claritin daily. May restart Singulair (montelukast) 10mg  daily at night. May use olopatadine eye drops 0.1% twice a day as needed for itchy/watery eyes. Use refresh eye drops.  Nasal saline spray (i.e., Simply Saline) or nasal saline lavage (i.e., NeilMed) is recommended as needed and prior to medicated nasal sprays.  Sinus infections Keep track of infections/antibiotics.  Aspirin Continue to avoid for now.  Penicillin allergy Consider penicillin skin testing/drug challenge in the future. You have to stop xyzal and claritin for 3 days and have to be in the office for 2-3 hours. More than 90% of patients outgrow their penicillin allergy.  Drug challenge instructions: You must be off antihistamines for 3-5 days before. Must be in good health and not ill. No vaccines/injections/antibiotics within the past 7 days. Plan on being in the office for 2-3 hours and must bring in the drug you want to do the oral challenge for - will send in prescription to pick up a few days before. You must call to schedule an appointment and specify it's for a drug challenge.   Other Minimize/avoid contact with perm solutions.  Follow up in 6 months or sooner if needed.

## 2022-10-19 ENCOUNTER — Ambulatory Visit (INDEPENDENT_AMBULATORY_CARE_PROVIDER_SITE_OTHER): Payer: 59 | Admitting: Family Medicine

## 2022-10-19 ENCOUNTER — Encounter: Payer: Self-pay | Admitting: Family Medicine

## 2022-10-19 VITALS — BP 118/80 | HR 72 | Temp 98.2°F | Ht 65.0 in | Wt 139.0 lb

## 2022-10-19 DIAGNOSIS — Z23 Encounter for immunization: Secondary | ICD-10-CM | POA: Diagnosis not present

## 2022-10-19 DIAGNOSIS — Z0000 Encounter for general adult medical examination without abnormal findings: Secondary | ICD-10-CM

## 2022-10-19 DIAGNOSIS — K580 Irritable bowel syndrome with diarrhea: Secondary | ICD-10-CM

## 2022-10-19 DIAGNOSIS — Z87820 Personal history of traumatic brain injury: Secondary | ICD-10-CM

## 2022-10-19 DIAGNOSIS — F988 Other specified behavioral and emotional disorders with onset usually occurring in childhood and adolescence: Secondary | ICD-10-CM

## 2022-10-19 DIAGNOSIS — Z793 Long term (current) use of hormonal contraceptives: Secondary | ICD-10-CM

## 2022-10-19 DIAGNOSIS — Z1159 Encounter for screening for other viral diseases: Secondary | ICD-10-CM

## 2022-10-19 DIAGNOSIS — Z79899 Other long term (current) drug therapy: Secondary | ICD-10-CM

## 2022-10-19 DIAGNOSIS — R69 Illness, unspecified: Secondary | ICD-10-CM | POA: Diagnosis not present

## 2022-10-19 LAB — COMPREHENSIVE METABOLIC PANEL
ALT: 16 U/L (ref 0–35)
AST: 20 U/L (ref 0–37)
Albumin: 4.4 g/dL (ref 3.5–5.2)
Alkaline Phosphatase: 50 U/L (ref 39–117)
BUN: 9 mg/dL (ref 6–23)
CO2: 27 mEq/L (ref 19–32)
Calcium: 9.3 mg/dL (ref 8.4–10.5)
Chloride: 100 mEq/L (ref 96–112)
Creatinine, Ser: 0.76 mg/dL (ref 0.40–1.20)
GFR: 104.27 mL/min (ref >60.00)
Glucose, Bld: 79 mg/dL (ref 70–99)
Potassium: 4.1 mEq/L (ref 3.5–5.1)
Sodium: 135 mEq/L (ref 135–145)
Total Bilirubin: 0.7 mg/dL (ref 0.2–1.2)
Total Protein: 7 g/dL (ref 6.0–8.3)

## 2022-10-19 LAB — TSH: TSH: 4.16 u[IU]/mL (ref 0.35–5.50)

## 2022-10-19 LAB — HEMOGLOBIN A1C: Hgb A1c MFr Bld: 4.5 % — ABNORMAL LOW (ref 4.6–6.5)

## 2022-10-19 LAB — LIPID PANEL
Cholesterol: 160 mg/dL (ref 0–200)
HDL: 76.9 mg/dL (ref >39.00)
LDL Cholesterol: 63 mg/dL (ref 0–99)
NonHDL: 83.44
Total CHOL/HDL Ratio: 2
Triglycerides: 104 mg/dL (ref 0.0–149.0)
VLDL: 20.8 mg/dL (ref 0.0–40.0)

## 2022-10-19 LAB — CBC
HCT: 40.6 % (ref 36.0–46.0)
Hemoglobin: 14.5 g/dL (ref 12.0–15.0)
MCHC: 35.6 g/dL (ref 30.0–36.0)
MCV: 91.4 fl (ref 78.0–100.0)
Platelets: 201 10*3/uL (ref 150.0–400.0)
RBC: 4.44 Mil/uL (ref 3.87–5.11)
RDW: 12.1 % (ref 11.5–15.5)
WBC: 5.1 10*3/uL (ref 4.0–10.5)

## 2022-10-19 MED ORDER — DICYCLOMINE HCL 10 MG PO CAPS: 10.0000 mg | ORAL_CAPSULE | Freq: Four times a day (QID) | ORAL | 11 refills | Status: DC | PRN

## 2022-10-19 MED ORDER — DICYCLOMINE HCL 10 MG PO CAPS: 10.0000 mg | ORAL_CAPSULE | Freq: Four times a day (QID) | ORAL | 3 refills | Status: DC | PRN

## 2022-10-19 MED ORDER — AMPHETAMINE-DEXTROAMPHETAMINE 10 MG PO TABS: 10.0000 mg | ORAL_TABLET | Freq: Two times a day (BID) | ORAL | 0 refills | Status: DC

## 2022-10-19 NOTE — Progress Notes (Signed)
Suzanne Jarvis , 02/04/1991, 31 y.o., female MRN: [id number] Patient Care Team    Relationship Specialty Notifications Start End  Natalia Leatherwood, DO PCP - General Family Medicine  09/04/15   Rodell Perna, NP (Inactive) Nurse Practitioner Obstetrics and Gynecology  01/29/20   Louann Sjogren, DPM Consulting Physician Podiatry  01/03/22     Chief Complaint  Patient presents with   Annual Exam    And [facility] combination appt   ADHD    Would like to stop meds     Subjective:Suzanne Jarvis is a 31 y.o.  pt present for an OV for cpe/cmc combination appt Health maintenance:  Colonoscopy: routine screen at 28 (had a colonoscopy for other reasons 01/2019) Mammogram: routine screen 40 Cervical cancer screening: last pap: 12/2019. [facility], Tiburcio Pea, NP Immunizations: tdap utd 2021, Influenza UTD declined (encouraged yearly).  Infectious disease screening: HIV completed w/ gyn. Hep c agreeable to testing.  DEXA: routine screen Assistive device: none Oxygen UXN:ATFT Patient has a Dental home. Hospitalizations/ED visits: reviewed  Attention deficit/ s/p concussion:  pt present to follow-up with her attention deficit status post concussion.  Patient reports  compliance with her  Adderall to 10 mg > she cut back to once daily. She would like to discontinue med, she does not feel it is making much of a difference. Prior note: Status post concussion and sequela of insomnia and focus deficits.  She has been evaluated and followed by sports medicine for this condition.  Her condition is felt to be stable and is now referred back to primary care to take over management.  Patient reports she is sleeping well on trazodone 50 mg nightly.  She also feels her focus is much improved on Adderall 20 mg XR.  She is uncertain if she needs this high of a dose.  She also would like to hopefully not have to stay on trazodone or Adderall long-term.  She does admit they are both helpful with her condition.     Irritable bowel syndrome with diarrhea Patient reports her irritable bowel syndrome is well controlled on Lomotil and Bentyl as needed.  She only takes this medication when she is going to be away from home and consuming meals.   She rarely requires lomotil use, but does need refills.     10/19/2022    9:34 AM 09/29/2021    9:23 AM 10/14/2020   10:36 AM 01/29/2020   10:58 AM 11/20/2019    9:38 AM  Depression screen PHQ 2/9  Decreased Interest 0 0 0 0 0  Down, Depressed, Hopeless 0 0 0 0 0  PHQ - 2 Score 0 0 0 0 0    Allergies  Allergen Reactions   Aspirin Anaphylaxis   Adrenal Cortex Extract Hives   Amlodipine Other (See Comments)    Per patient burning sensation through body   Amoxicillin Hives   Hyoscyamine Other (See Comments)   Mixed Grasses    Novocain [Procaine] Swelling   Other      grass, weed, ragweed, trees, cat, mold, dog, dust mites. Borderline to cockroach.     Paxil [Paroxetine Hcl]     Dizziness    Penicillins     REACTION: hives   Social History   Social History Narrative   Right Handed    Lives in a two story condo on the second floor    Goes to the gyn 3 times a week.      Has a  [name] that she called her son    Past Medical History:  Diagnosis Date   ADD (attention deficit disorder) 07/08/2020   Angio-edema    ASCUS with positive high risk HPV cervical 03/26/2019   01/2019; negative colposcopy 03/2019; plan repeat pap and HPV test 01/2020   Asthma    Chronic headaches    Concussion with no loss of consciousness 07/08/2020   May 2021   Eczema    IBS (irritable bowel syndrome)    Penicillin allergy 04/28/2020   Seasonal and perennial allergic rhinoconjunctivitis 07/28/2020   Tonsillitis    Urticaria    Past Surgical History:  Procedure Laterality Date   WISDOM TOOTH EXTRACTION     Family History  Problem Relation Age of Onset   Allergies Mother    Asthma Mother    Hypertension Mother    Allergic rhinitis Mother    Urticaria Mother     Healthy Father    Allergic rhinitis Sister    Eczema Sister    Anemia Sister    Hashimoto's thyroiditis Brother    ADD / ADHD Brother    Arthritis Maternal Grandmother    Irritable bowel syndrome Maternal Grandmother    Breast cancer Maternal Grandmother    Lupus Cousin    Hashimoto's thyroiditis Cousin    Lung cancer Other        paternal great grandfather   Lung cancer Other        maternal great grandfather   Pancreatic cancer Other        great aunt   Colon cancer Neg Hx    Allergies as of 10/19/2022       Reactions   Aspirin Anaphylaxis   Adrenal Cortex Extract Hives   Amlodipine Other (See Comments)   Per patient burning sensation through body   Amoxicillin Hives   Hyoscyamine Other (See Comments)   Mixed Grasses    Novocain [procaine] Swelling   Other     grass, weed, ragweed, trees, cat, mold, dog, dust mites. Borderline to cockroach.     Paxil [paroxetine Hcl]    Dizziness   Penicillins    REACTION: hives        Medication List        Accurate as of October 19, 2022  9:55 AM. If you have any questions, ask your nurse or doctor.          STOP taking these medications    dextromethorphan 30 MG/5ML liquid Commonly known as: DELSYM Stopped by: Howard Pouch, DO       TAKE these medications    amphetamine-dextroamphetamine 10 MG tablet Commonly known as: Adderall Take 1 tablet (10 mg total) by mouth 2 (two) times daily.   ascorbic acid 500 MG tablet Commonly known as: VITAMIN C Take by mouth.   B-12 1000 MCG Tabs Take 2 tablets by mouth daily.   colchicine 0.6 MG tablet Take 0.6 mg by mouth as needed.   dicyclomine 10 MG capsule Commonly known as: BENTYL Take 1 capsule (10 mg total) by mouth 4 (four) times daily as needed for spasms. TAKE 1 CAPSULE BY MOUTH FOUR TIMES DAILY BEFORE MEALS AND AT BEDTIME PRN What changed:  how much to take how to take this when to take this reasons to take this Changed by: Howard Pouch, DO    diphenoxylate-atropine 2.5-0.025 MG tablet Commonly known as: LOMOTIL TAKE 1 TABLET BY MOUTH FOUR TIMES DAILY AS NEEDED FOR DIARRHEA OR LOOSE STOOLS   drospirenone-ethinyl estradiol 3-0.02  MG tablet Commonly known as: YAZ Take 1 tablet by mouth daily.   levocetirizine 5 MG tablet Commonly known as: XYZAL Take 1 tablet (5 mg total) by mouth every evening.   Magnesium 100 MG Tabs Take by mouth. W/ calcium and zinc   multivitamin with minerals tablet Take 1 tablet by mouth daily.   olopatadine 0.1 % ophthalmic solution Commonly known as: Patanol Place 1 drop into both eyes 2 (two) times daily as needed (itchy/watery eyes).   Vitamin D (Cholecalciferol) 25 MCG (1000 UT) Caps Take by mouth.        All past medical history, surgical history, allergies, family history, immunizations andmedications were updated in the EMR today and reviewed under the history and medication portions of their EMR.     ROS: Negative, with the exception of above mentioned in HPI   Objective:  BP 118/80   Pulse 72   Temp 98.2 F (36.8 C) (Oral)   Ht 5\' 5"  (1.651 m)   Wt 139 lb (63 kg)   SpO2 100%   BMI 23.13 kg/m  Body mass index is 23.13 kg/m. Physical Exam Vitals and nursing note reviewed.  Constitutional:      General: She is not in acute distress.    Appearance: Normal appearance. She is not ill-appearing or toxic-appearing.  HENT:     Head: Normocephalic and atraumatic.     Right Ear: Tympanic membrane, ear canal and external ear normal. There is no impacted cerumen.     Left Ear: Tympanic membrane, ear canal and external ear normal. There is no impacted cerumen.     Nose: No congestion or rhinorrhea.     Mouth/Throat:     Mouth: Mucous membranes are moist.     Pharynx: Oropharynx is clear. No oropharyngeal exudate or posterior oropharyngeal erythema.  Eyes:     General: No scleral icterus.       Right eye: No discharge.        Left eye: No discharge.     Extraocular  Movements: Extraocular movements intact.     Conjunctiva/sclera: Conjunctivae normal.     Pupils: Pupils are equal, round, and reactive to light.  Cardiovascular:     Rate and Rhythm: Normal rate and regular rhythm.     Pulses: Normal pulses.     Heart sounds: Normal heart sounds. No murmur heard.    No friction rub. No gallop.  Pulmonary:     Effort: Pulmonary effort is normal. No respiratory distress.     Breath sounds: Normal breath sounds. No stridor. No wheezing, rhonchi or rales.  Chest:     Chest wall: No tenderness.  Abdominal:     General: Abdomen is flat. Bowel sounds are normal. There is no distension.     Palpations: Abdomen is soft. There is no mass.     Tenderness: There is no abdominal tenderness. There is no right CVA tenderness, left CVA tenderness, guarding or rebound.     Hernia: No hernia is present.  Musculoskeletal:        General: No swelling, tenderness or deformity. Normal range of motion.     Cervical back: Normal range of motion and neck supple. No rigidity or tenderness.     Right lower leg: No edema.     Left lower leg: No edema.  Lymphadenopathy:     Cervical: No cervical adenopathy.  Skin:    General: Skin is warm and dry.     Coloration: Skin is not jaundiced or pale.  Findings: No bruising, erythema, lesion or rash.  Neurological:     General: No focal deficit present.     Mental Status: She is alert and oriented to person, place, and time. Mental status is at baseline.     Cranial Nerves: No cranial nerve deficit.     Sensory: No sensory deficit.     Motor: No weakness.     Coordination: Coordination normal.     Gait: Gait normal.     Deep Tendon Reflexes: Reflexes normal.  Psychiatric:        Mood and Affect: Mood normal.        Behavior: Behavior normal.        Thought Content: Thought content normal.        Judgment: Judgment normal.     No results found. No results found. No results found for this or any previous visit (from the  past 24 hour(s)).  Assessment/Plan: Suzanne Jarvis is a 31 y.o. female present for OV for cpe/cbc Attention deficit disorder (ADD) without hyperactivity/Controlled substance agreement signed/history of concussion with focus deficit Feeling well. Ok to DC adderall- sent 1 script in the event she notices a difference and would like to return to use.  Adderall to 10 mg twice dail PRN (#90 d scripts x1- since she may taper off) [place] controlled substance database reviewed 10/19/22 Controlled substance agreement signed  UDS UTD Follow-up 5.5 months if desires continuation- if not, cpe in 1 year  Irritable bowel syndrome with diarrhea Stable Continue  Lomotil as needed> does not need refills today Continue  Bentyl as needed  Seasonal allergies Following with allergist.   Extremity swelling and pain/cold extremities/rash: Following with rheumatology> suspect lupus or other CT disorder. Rash today consistent with poss. Dermatomyositis rash. > lidex prescribed.  Est Neuro- 12/17/2021  Encounter for long-term current use of medication/long term contraception use - CBC - Comprehensive metabolic panel - Hemoglobin A1c - Lipid panel - TSH  Encounter for hepatitis C screening test for low risk patient - Hepatitis C Antibody  Routine general medical examination at a health care facility Colonoscopy: routine screen at 45 (had a colonoscopy for other reasons 01/2019) Mammogram: routine screen 40 Cervical cancer screening: last pap: 12/2019. [facility], Tiburcio Pea, NP Immunizations: tdap utd 2021, Influenza UTD declined (encouraged yearly).  Infectious disease screening: HIV completed w/ gyn. Hep c agreeable to testing.  DEXA: routine screen Patient was encouraged to exercise greater than 150 minutes a week. Patient was encouraged to choose a diet filled with fresh fruits and vegetables, and lean meats. AVS provided to patient today for education/recommendation on gender specific health  and safety maintenance.   Reviewed expectations re: course of current medical issues. Discussed self-management of symptoms. Outlined signs and symptoms indicating need for more acute intervention. Patient verbalized understanding and all questions were answered. Patient received an After-Visit Summary.   Orders Placed This Encounter  Procedures   CBC   Comprehensive metabolic panel   Hemoglobin A1c   Lipid panel   TSH   Hepatitis C Antibody    Meds ordered this encounter  Medications   DISCONTD: dicyclomine (BENTYL) 10 MG capsule    Sig: Take 1 capsule (10 mg total) by mouth 4 (four) times daily as needed for spasms. TAKE 1 CAPSULE BY MOUTH FOUR TIMES DAILY BEFORE MEALS AND AT BEDTIME PRN    Dispense:  120 capsule    Refill:  3    Prn med- please only fill if pt request  amphetamine-dextroamphetamine (ADDERALL) 10 MG tablet    Sig: Take 1 tablet (10 mg total) by mouth 2 (two) times daily.    Dispense:  180 tablet    Refill:  0   dicyclomine (BENTYL) 10 MG capsule    Sig: Take 1 capsule (10 mg total) by mouth 4 (four) times daily as needed for spasms. TAKE 1 CAPSULE BY MOUTH FOUR TIMES DAILY BEFORE MEALS AND AT BEDTIME PRN    Dispense:  120 capsule    Refill:  11    Prn med- please only fill if pt request    Referral Orders  No referral(s) requested today    Note is dictated utilizing voice recognition software. Although note has been proof read prior to signing, occasional typographical errors still can be missed. If any questions arise, please do not hesitate to call for verification.   electronically signed by:  Howard Pouch, DO  [place]

## 2022-10-19 NOTE — Patient Instructions (Addendum)
Return in about 24 weeks (around 04/05/2023) for Routine chronic condition follow-up.        Great to see you today.  I have refilled the medication(s) we provide.   If labs were collected, we will inform you of lab results once received either by echart message or telephone call.   - echart message- for normal results that have been seen by the patient already.   - telephone call: abnormal results or if patient has not viewed results in their echart.  Health Maintenance, Female Adopting a healthy lifestyle and getting preventive care are important in promoting health and wellness. Ask your health care provider about: The right schedule for you to have regular tests and exams. Things you can do on your own to prevent diseases and keep yourself healthy. What should I know about diet, weight, and exercise? Eat a healthy diet  Eat a diet that includes plenty of vegetables, fruits, low-fat dairy products, and lean protein. Do not eat a lot of foods that are high in solid fats, added sugars, or sodium. Maintain a healthy weight Body mass index (BMI) is used to identify weight problems. It estimates body fat based on height and weight. Your health care provider can help determine your BMI and help you achieve or maintain a healthy weight. Get regular exercise Get regular exercise. This is one of the most important things you can do for your health. Most adults should: Exercise for at least 150 minutes each week. The exercise should increase your heart rate and make you sweat (moderate-intensity exercise). Do strengthening exercises at least twice a week. This is in addition to the moderate-intensity exercise. Spend less time sitting. Even light physical activity can be beneficial. Watch cholesterol and blood lipids Have your blood tested for lipids and cholesterol at 31 years of age, then have this test every 5 years. Have your cholesterol levels checked more often if: Your lipid or  cholesterol levels are high. You are older than 31 years of age. You are at high risk for heart disease. What should I know about cancer screening? Depending on your health history and family history, you may need to have cancer screening at various ages. This may include screening for: Breast cancer. Cervical cancer. Colorectal cancer. Skin cancer. Lung cancer. What should I know about heart disease, diabetes, and high blood pressure? Blood pressure and heart disease High blood pressure causes heart disease and increases the risk of stroke. This is more likely to develop in people who have high blood pressure readings or are overweight. Have your blood pressure checked: Every 3-5 years if you are 25-65 years of age. Every year if you are 24 years old or older. Diabetes Have regular diabetes screenings. This checks your fasting blood sugar level. Have the screening done: Once every three years after age 57 if you are at a normal weight and have a low risk for diabetes. More often and at a younger age if you are overweight or have a high risk for diabetes. What should I know about preventing infection? Hepatitis B If you have a higher risk for hepatitis B, you should be screened for this virus. Talk with your health care provider to find out if you are at risk for hepatitis B infection. Hepatitis C Testing is recommended for: Everyone born from [birth year] through 1965. Anyone with known risk factors for hepatitis C. Sexually transmitted infections (STIs) Get screened for STIs, including gonorrhea and chlamydia, if: You are sexually active and are  younger than 31 years of age. You are older than 31 years of age and your health care provider tells you that you are at risk for this type of infection. Your sexual activity has changed since you were last screened, and you are at increased risk for chlamydia or gonorrhea. Ask your health care provider if you are at risk. Ask your health care  provider about whether you are at high risk for HIV. Your health care provider may recommend a prescription medicine to help prevent HIV infection. If you choose to take medicine to prevent HIV, you should first get tested for HIV. You should then be tested every 3 months for as long as you are taking the medicine. Pregnancy If you are about to stop having your period (premenopausal) and you may become pregnant, seek counseling before you get pregnant. Take 400 to 800 micrograms (mcg) of folic acid every day if you become pregnant. Ask for birth control (contraception) if you want to prevent pregnancy. Osteoporosis and menopause Osteoporosis is a disease in which the bones lose minerals and strength with aging. This can result in bone fractures. If you are 72 years old or older, or if you are at risk for osteoporosis and fractures, ask your health care provider if you should: Be screened for bone loss. Take a calcium or vitamin D supplement to lower your risk of fractures. Be given hormone replacement therapy (HRT) to treat symptoms of menopause. Follow these instructions at home: Alcohol use Do not drink alcohol if: Your health care provider tells you not to drink. You are pregnant, may be pregnant, or are planning to become pregnant. If you drink alcohol: Limit how much you have to: 0-1 drink a day. Know how much alcohol is in your drink. In the [place] one drink equals one 12 oz bottle of beer (355 mL), one 5 oz glass of wine (148 mL), or one 1 oz glass of hard liquor (44 mL). Lifestyle Do not use any products that contain nicotine or tobacco. These products include cigarettes, chewing tobacco, and vaping devices, such as e-cigarettes. If you need help quitting, ask your health care provider. Do not use street drugs. Do not share needles. Ask your health care provider for help if you need support or information about quitting drugs. General instructions Schedule regular health, dental, and  eye exams. Stay current with your vaccines. Tell your health care provider if: You often feel depressed. You have ever been abused or do not feel safe at home. Summary Adopting a healthy lifestyle and getting preventive care are important in promoting health and wellness. Follow your health care provider's instructions about healthy diet, exercising, and getting tested or screened for diseases. Follow your health care provider's instructions on monitoring your cholesterol and blood pressure. This information is not intended to replace advice given to you by your health care provider. Make sure you discuss any questions you have with your health care provider. Document Revised: 03/22/2021 Document Reviewed: 03/22/2021 Elsevier Patient Education  [employer].

## 2022-10-20 LAB — HEPATITIS C ANTIBODY: Hepatitis C Ab: NONREACTIVE

## 2023-01-24 ENCOUNTER — Ambulatory Visit
Admission: RE | Admit: 2023-01-24 | Discharge: 2023-01-24 | Disposition: A | Discharge status: Home / Self Care | Payer: 59 | Source: Ambulatory Visit | Attending: Family Medicine | Admitting: Family Medicine

## 2023-01-24 VITALS — BP 145/96 | HR 106 | Temp 99.0°F | Resp 16

## 2023-01-24 DIAGNOSIS — J069 Acute upper respiratory infection, unspecified: Secondary | ICD-10-CM

## 2023-01-24 DIAGNOSIS — D899 Disorder involving the immune mechanism, unspecified: Secondary | ICD-10-CM | POA: Diagnosis not present

## 2023-01-24 DIAGNOSIS — H1010 Acute atopic conjunctivitis, unspecified eye: Secondary | ICD-10-CM

## 2023-01-24 DIAGNOSIS — J302 Other seasonal allergic rhinitis: Secondary | ICD-10-CM

## 2023-01-24 DIAGNOSIS — J3089 Other allergic rhinitis: Secondary | ICD-10-CM | POA: Diagnosis not present

## 2023-01-24 DIAGNOSIS — Z8709 Personal history of other diseases of the respiratory system: Secondary | ICD-10-CM

## 2023-01-24 MED ORDER — DOXYCYCLINE HYCLATE 100 MG PO CAPS: 100.0000 mg | ORAL_CAPSULE | Freq: Two times a day (BID) | ORAL | 0 refills | Status: DC

## 2023-01-24 MED ORDER — METHYLPREDNISOLONE ACETATE 80 MG/ML IJ SUSP
80.0000 mg | Freq: Once | INTRAMUSCULAR | Status: AC
  Administered 2023-01-24: 80 mg via INTRAMUSCULAR

## 2023-01-24 NOTE — ED Triage Notes (Signed)
Sunday began having nasal congestion that progressed into facial and orbital pain and pressure. Yesterday began having left ear pain and itching sensation that has progressed to both ears today. States she's had trouble with frequent sinus infections in the past. Treating at home with ibuprofen (last dose around 0645 this morning), delsym, and sudafed.

## 2023-01-24 NOTE — ED Provider Notes (Signed)
[facility]    CSN: [id number] Arrival date & time: 01/24/23  0840      History   Chief Complaint Chief Complaint  Patient presents with   Cough    I believe i have a sinus infection, possible ear infection. Ear pain and sinus pain with a productive cough. No temperature. - Entered by patient    HPI Suzanne Jarvis is a 32 y.o. female.   HPI Chart is reviewed.  Patient is documented to have frequent upper respiratory infections.  She also has underlying allergies.  She states she has frequent sinus infections that require antibiotics.  She has been told this may be because of her immunocompromise (lupus).  Currently she has had sinus congestion, postnasal drip, productive cough, headache and irritation in her throat for 3 days.  She states is getting worse. She is allergic to multiple antibiotics.  She states she usually gets doxycycline. Past Medical History:  Diagnosis Date   ADD (attention deficit disorder) 07/08/2020   Angio-edema    ASCUS with positive high risk HPV cervical 03/26/2019   01/2019; negative colposcopy 03/2019; plan repeat pap and HPV test 01/2020   Asthma    Chronic headaches    Concussion with no loss of consciousness 07/08/2020   May 2021   Eczema    IBS (irritable bowel syndrome)    Penicillin allergy 04/28/2020   Seasonal and perennial allergic rhinoconjunctivitis 07/28/2020   Tonsillitis    Urticaria     Patient Active Problem List   Diagnosis Date Noted   Multiple drug allergies 09/21/2022   Aphthous ulcer of mouth 09/29/2021   Metatarsalgia of right foot 07/07/2021   Bilateral hand numbness 04/14/2021   Positive ANA (antinuclear antibody) 02/24/2021   History of frequent upper respiratory infection 01/27/2021   Seasonal allergies 01/14/2021   Seasonal and perennial allergic rhinoconjunctivitis 07/28/2020   History of brain concussion 07/08/2020   ADD (attention deficit disorder) 07/08/2020   IBS (irritable bowel syndrome) 09/30/2015    Vitamin B 12 deficiency 12/10/2014    Past Surgical History:  Procedure Laterality Date   WISDOM TOOTH EXTRACTION      OB History   No obstetric history on file.      Home Medications    Prior to Admission medications   Medication Sig Start Date End Date Taking? Authorizing Provider  doxycycline (VIBRAMYCIN) 100 MG capsule Take 1 capsule (100 mg total) by mouth 2 (two) times daily. 01/24/23  Yes Raylene Everts, MD  amphetamine-dextroamphetamine (ADDERALL) 10 MG tablet Take 1 tablet (10 mg total) by mouth 2 (two) times daily. 10/19/22   [name], [name], DO  ascorbic acid (VITAMIN C) 500 MG tablet Take by mouth.    [provider]  colchicine 0.6 MG tablet Take 0.6 mg by mouth as needed.    [provider]  Cyanocobalamin (B-12) 1000 MCG TABS Take 2 tablets by mouth daily.    [provider]  dicyclomine (BENTYL) 10 MG capsule Take 1 capsule (10 mg total) by mouth 4 (four) times daily as needed for spasms. TAKE 1 CAPSULE BY MOUTH FOUR TIMES DAILY BEFORE MEALS AND AT BEDTIME PRN 10/19/22   [name], [name], DO  diphenoxylate-atropine (LOMOTIL) 2.5-0.025 MG tablet TAKE 1 TABLET BY MOUTH FOUR TIMES DAILY AS NEEDED FOR DIARRHEA OR LOOSE STOOLS 05/25/22   [name], [name], DO  drospirenone-ethinyl estradiol (YAZ,GIANVI,LORYNA) 3-0.02 MG tablet Take 1 tablet by mouth daily.    [provider]  levocetirizine Harlow Ohms) 5  MG tablet Take 1 tablet (5 mg total) by mouth every evening. 09/21/22   Garnet Sierras, DO  Magnesium 100 MG TABS Take by mouth. W/ calcium and zinc    [provider]  Multiple Vitamins-Minerals (MULTIVITAMIN WITH MINERALS) tablet Take 1 tablet by mouth daily.    [provider]  olopatadine (PATANOL) 0.1 % ophthalmic solution Place 1 drop into both eyes 2 (two) times daily as needed (itchy/watery eyes). 09/21/22   Garnet Sierras, DO  Vitamin D, Cholecalciferol, 25 MCG (1000 UT) CAPS Take by mouth.    [provider]     Family History Family History  Problem Relation Age of Onset   Allergies Mother    Asthma Mother    Hypertension Mother    Allergic rhinitis Mother    Urticaria Mother    Healthy Father    Allergic rhinitis Sister    Eczema Sister    Anemia Sister    Hashimoto's thyroiditis Brother    ADD / ADHD Brother    Arthritis Maternal Grandmother    Irritable bowel syndrome Maternal Grandmother    Breast cancer Maternal Grandmother    Lupus Cousin    Hashimoto's thyroiditis Cousin    Lung cancer Other        paternal great grandfather   Lung cancer Other        maternal great grandfather   Pancreatic cancer Other        great aunt   Colon cancer Neg Hx     Social History Social History   Tobacco Use   Smoking status: Former    Packs/day: 0.25    Years: 6.00    Total pack years: 1.50    Types: Cigarettes    Quit date: 2018    Years since quitting: 6.1   Smokeless tobacco: Never  Vaping Use   Vaping Use: Some days  Substance Use Topics   Alcohol use: Yes    Comment: Rarely   Drug use: No     Allergies   Aspirin, Adrenal cortex extract, Amlodipine, Amoxicillin, Hyoscyamine, Mixed grasses, Novocain [procaine], Other, Paxil [paroxetine hcl], and Penicillins   Review of Systems Review of Systems See HPI  Physical Exam Triage Vital Signs ED Triage Vitals  Enc Vitals Group     BP 01/24/23 0846 (!) 145/96     Pulse Rate 01/24/23 0846 (!) 106     Resp 01/24/23 0846 16     Temp 01/24/23 0846 99 F (37.2 C)     Temp Source 01/24/23 0846 Oral     SpO2 01/24/23 0846 100 %     Weight --      Height --      Head Circumference --      Peak Flow --      Pain Score 01/24/23 0849 2     Pain Loc --      Pain Edu? --      Excl. in [place]? --    No data found.  Updated Vital Signs BP (!) 145/96 (BP Location: Left Arm)   Pulse (!) 106   Temp 99 F (37.2 C) (Oral)   Resp 16   SpO2 100%      Physical Exam Constitutional:      General: She is not in acute  distress.    Appearance: She is well-developed and normal weight. She is ill-appearing.  HENT:     Head: Normocephalic and atraumatic.     Right Ear: Tympanic membrane and  ear canal normal.     Left Ear: Tympanic membrane and ear canal normal.     Nose: Congestion and rhinorrhea present.     Mouth/Throat:     Pharynx: Posterior oropharyngeal erythema present.  Eyes:     Conjunctiva/sclera: Conjunctivae normal.     Pupils: Pupils are equal, round, and reactive to light.  Cardiovascular:     Rate and Rhythm: Normal rate and regular rhythm.     Heart sounds: Normal heart sounds.  Pulmonary:     Effort: Pulmonary effort is normal. No respiratory distress.     Breath sounds: Normal breath sounds. No wheezing or rhonchi.  Abdominal:     General: There is no distension.     Palpations: Abdomen is soft.  Musculoskeletal:        General: Normal range of motion.     Cervical back: Normal range of motion.  Lymphadenopathy:     Cervical: Cervical adenopathy present.  Skin:    General: Skin is warm and dry.  Neurological:     Mental Status: She is alert.  Psychiatric:        Mood and Affect: Mood normal.      UC Treatments / Results  Labs (all labs ordered are listed, but only abnormal results are displayed) Labs Reviewed - No data to display  EKG   Radiology No results found.  Procedures Procedures (including critical care time)  Medications Ordered in UC Medications  methylPREDNISolone acetate (DEPO-MEDROL) injection 80 mg (has no administration in time range)    Initial Impression / Assessment and Plan / UC Course  I have reviewed the triage vital signs and the nursing notes.  Pertinent labs & imaging results that were available during my care of the patient were reviewed by me and considered in my medical decision making (see chart for details).     Final Clinical Impressions(s) / UC Diagnoses   Final diagnoses:  Viral URI with cough  Seasonal and perennial  allergic rhinoconjunctivitis  History of frequent upper respiratory infection  Immune disorder ([place])     Discharge Instructions      Sinus infections are usually caused by viruses.  You have underlying allergies.  Continue your allergy medicine.  You have received a shot of steroid to help with symptoms.  Make sure you are drinking lots of fluids.  Consider using Flonase for a few days until congestion improves If you get worse instead of better, run fever, or have increased pressure and pain in your sinus fill and take the doxycycline Follow-up with your primary care doctor   ED Prescriptions     Medication Sig Dispense Auth. Provider   doxycycline (VIBRAMYCIN) 100 MG capsule Take 1 capsule (100 mg total) by mouth 2 (two) times daily. 20 capsule Raylene Everts, MD      PDMP not reviewed this encounter.   Raylene Everts, MD 01/24/23 [phone]

## 2023-01-24 NOTE — Discharge Instructions (Signed)
Sinus infections are usually caused by viruses.  You have underlying allergies.  Continue your allergy medicine.  You have received a shot of steroid to help with symptoms.  Make sure you are drinking lots of fluids.  Consider using Flonase for a few days until congestion improves If you get worse instead of better, run fever, or have increased pressure and pain in your sinus fill and take the doxycycline Follow-up with your primary care doctor

## 2023-01-25 ENCOUNTER — Telehealth: Payer: Self-pay | Admitting: Emergency Medicine

## 2023-02-22 ENCOUNTER — Ambulatory Visit (INDEPENDENT_AMBULATORY_CARE_PROVIDER_SITE_OTHER): Payer: 59 | Admitting: Family Medicine

## 2023-02-22 VITALS — BP 133/85 | HR 82 | Wt 139.0 lb

## 2023-02-22 DIAGNOSIS — R051 Acute cough: Secondary | ICD-10-CM | POA: Diagnosis not present

## 2023-02-22 DIAGNOSIS — J301 Allergic rhinitis due to pollen: Secondary | ICD-10-CM

## 2023-02-22 MED ORDER — MONTELUKAST SODIUM 10 MG PO TABS
10.0000 mg | ORAL_TABLET | Freq: Every day | ORAL | 1 refills | Status: DC
Start: 2023-02-22 — End: 2023-09-27

## 2023-02-22 MED ORDER — IPRATROPIUM BROMIDE 0.06 % NA SOLN: 2.0000 | Freq: Four times a day (QID) | NASAL | 12 refills | Status: DC

## 2023-02-22 MED ORDER — AZELASTINE HCL 0.1 % NA SOLN: 1.0000 | Freq: Two times a day (BID) | NASAL | 12 refills | Status: DC

## 2023-02-22 NOTE — Progress Notes (Signed)
Suzanne Jarvis , February 22, 1991, 32 y.o., female MRN: [id number] Patient Care Team    Relationship Specialty Notifications Start End  Natalia Leatherwood, DO PCP - General Family Medicine  09/04/15   Rodell Perna, NP (Inactive) Nurse Practitioner Obstetrics and Gynecology  01/29/20   Louann Sjogren, DPM Consulting Physician Podiatry  01/03/22     Chief Complaint  Patient presents with   Cough    Was diagnosed with a sinus infection about a month and is still having a cough with clear mucous.      Subjective: Suzanne Jarvis is a 32 y.o. Pt presents for an OV with complaints of cough of 1 month duration.  She was seen on an urgent care around that time and treated with doxycycline and a steroid shot.  She was also told to restart Flonase which she had.  She has continued to take the Xyzal before bed and Allegra during the day.  At one time she was on Singulair for her allergies as well, but not currently.  She denies any fevers or chills.  She denies sore throat or shortness of breath.     02/22/2023    8:33 AM 10/19/2022    9:34 AM 09/29/2021    9:23 AM 10/14/2020   10:36 AM 01/29/2020   10:58 AM  Depression screen PHQ 2/9  Decreased Interest 0 0 0 0 0  Down, Depressed, Hopeless 0 0 0 0 0  PHQ - 2 Score 0 0 0 0 0  Altered sleeping 1      Tired, decreased energy 2      Change in appetite 0      Feeling bad or failure about yourself  0      Trouble concentrating 1      Moving slowly or fidgety/restless 0      Suicidal thoughts 0      PHQ-9 Score 4      Difficult doing work/chores Not difficult at all        Allergies  Allergen Reactions   Aspirin Anaphylaxis   Adrenal Cortex Extract Hives   Amlodipine Other (See Comments)    Per patient burning sensation through body   Amoxicillin Hives   Hyoscyamine Other (See Comments)   Mixed Grasses    Novocain [Procaine] Swelling   Other      grass, weed, ragweed, trees, cat, mold, dog, dust mites. Borderline to cockroach.      Paxil [Paroxetine Hcl]     Dizziness    Penicillins     REACTION: hives   Social History   Social History Narrative   Right Handed    Lives in a two story condo on the second floor    Goes to the gyn 3 times a week.      Has a Allena Earing that she called her son    Past Medical History:  Diagnosis Date   ADD (attention deficit disorder) 07/08/2020   Angio-edema    ASCUS with positive high risk HPV cervical 03/26/2019   01/2019; negative colposcopy 03/2019; plan repeat pap and HPV test 01/2020   Asthma    Chronic headaches    Concussion with no loss of consciousness 07/08/2020   May 2021   Eczema    IBS (irritable bowel syndrome)    Penicillin allergy 04/28/2020   Seasonal and perennial allergic rhinoconjunctivitis 07/28/2020   Tonsillitis    Urticaria    Past Surgical History:  Procedure Laterality Date  WISDOM TOOTH EXTRACTION     Family History  Problem Relation Age of Onset   Allergies Mother    Asthma Mother    Hypertension Mother    Allergic rhinitis Mother    Urticaria Mother    Healthy Father    Allergic rhinitis Sister    Eczema Sister    Anemia Sister    Hashimoto's thyroiditis Brother    ADD / ADHD Brother    Arthritis Maternal Grandmother    Irritable bowel syndrome Maternal Grandmother    Breast cancer Maternal Grandmother    Lupus Cousin    Hashimoto's thyroiditis Cousin    Lung cancer Other        paternal great grandfather   Lung cancer Other        maternal great grandfather   Pancreatic cancer Other        great aunt   Colon cancer Neg Hx    Allergies as of 02/22/2023       Reactions   Aspirin Anaphylaxis   Adrenal Cortex Extract Hives   Amlodipine Other (See Comments)   Per patient burning sensation through body   Amoxicillin Hives   Hyoscyamine Other (See Comments)   Mixed Grasses    Novocain [procaine] Swelling   Other     grass, weed, ragweed, trees, cat, mold, dog, dust mites. Borderline to cockroach.     Paxil [paroxetine  Hcl]    Dizziness   Penicillins    REACTION: hives        Medication List        Accurate as of February 22, 2023  8:56 AM. If you have any questions, ask your nurse or doctor.          STOP taking these medications    doxycycline 100 MG capsule Commonly known as: VIBRAMYCIN       TAKE these medications    amphetamine-dextroamphetamine 10 MG tablet Commonly known as: Adderall Take 1 tablet (10 mg total) by mouth 2 (two) times daily.   ascorbic acid 500 MG tablet Commonly known as: VITAMIN C Take by mouth.   B-12 1000 MCG Tabs Take 2 tablets by mouth daily.   colchicine 0.6 MG tablet Take 0.6 mg by mouth as needed.   dicyclomine 10 MG capsule Commonly known as: BENTYL Take 1 capsule (10 mg total) by mouth 4 (four) times daily as needed for spasms. TAKE 1 CAPSULE BY MOUTH FOUR TIMES DAILY BEFORE MEALS AND AT BEDTIME PRN   diphenoxylate-atropine 2.5-0.025 MG tablet Commonly known as: LOMOTIL TAKE 1 TABLET BY MOUTH FOUR TIMES DAILY AS NEEDED FOR DIARRHEA OR LOOSE STOOLS   drospirenone-ethinyl estradiol 3-0.02 MG tablet Commonly known as: YAZ Take 1 tablet by mouth daily.   levocetirizine 5 MG tablet Commonly known as: XYZAL Take 1 tablet (5 mg total) by mouth every evening.   Magnesium 100 MG Tabs Take by mouth. W/ calcium and zinc   multivitamin with minerals tablet Take 1 tablet by mouth daily.   olopatadine 0.1 % ophthalmic solution Commonly known as: Patanol Place 1 drop into both eyes 2 (two) times daily as needed (itchy/watery eyes).   Vitamin D (Cholecalciferol) 25 MCG (1000 UT) Caps Take by mouth.        All past medical history, surgical history, allergies, family history, immunizations andmedications were updated in the EMR today and reviewed under the history and medication portions of their EMR.     Review of Systems  Constitutional:  Negative for chills, fever and  malaise/fatigue.  HENT:  Negative for congestion, ear discharge, ear  pain, sinus pain and sore throat.   Eyes: Negative.   Respiratory:  Positive for cough. Negative for sputum production, shortness of breath and wheezing.   Cardiovascular: Negative.   Gastrointestinal: Negative.   Neurological:  Negative for dizziness and headaches.   Negative, with the exception of above mentioned in HPI   Objective:  BP 133/85   Pulse 82   Wt 139 lb (63 kg)   SpO2 100%   BMI 23.13 kg/m  Body mass index is 23.13 kg/m. Physical Exam Vitals and nursing note reviewed.  Constitutional:      General: She is not in acute distress.    Appearance: Normal appearance. She is normal weight. She is not ill-appearing or toxic-appearing.  HENT:     Head: Normocephalic and atraumatic.     Right Ear: Hearing, tympanic membrane and ear canal normal. Tympanic membrane is not injected, erythematous or bulging.     Left Ear: Hearing, tympanic membrane and ear canal normal. Tympanic membrane is not injected, erythematous or bulging.     Nose: Rhinorrhea present. No congestion.     Right Turbinates: Not enlarged, swollen or pale.     Left Turbinates: Swollen. Not enlarged or pale.     Right Sinus: No maxillary sinus tenderness or frontal sinus tenderness.     Left Sinus: No maxillary sinus tenderness or frontal sinus tenderness.     Comments: Postnasal drip present. Eyes:     General: No scleral icterus.       Right eye: No discharge.        Left eye: No discharge.     Extraocular Movements: Extraocular movements intact.     Conjunctiva/sclera: Conjunctivae normal.     Pupils: Pupils are equal, round, and reactive to light.  Skin:    Findings: No rash.  Neurological:     Mental Status: She is alert and oriented to person, place, and time. Mental status is at baseline.     Motor: No weakness.     Coordination: Coordination normal.     Gait: Gait normal.  Psychiatric:        Mood and Affect: Mood normal.        Behavior: Behavior normal.        Thought Content: Thought  content normal.        Judgment: Judgment normal.     No results found. No results found. No results found for this or any previous visit (from the past 24 hour(s)).  Assessment/Plan: Christin Fudgeheresa B Coyle is a 32 y.o. female present for OV for  Seasonal allergic rhinitis due to pollen-cough It does not appear infectious today.  She has very mild swelling of the left nasal turbinate, otherwise exam is normal with the exception of postnasal drip.  I suspect this is what is causing her cough. Encouraged her to continue her antihistamine regimen. Start Astelin nasal spray Restart Singulair Atrovent nasal spray prescribed to help with symptoms. Briefly discussed.  Nasal Navage and air filtration to help with chronic allergies Follow-up as needed  Reviewed expectations re: course of current medical issues. Discussed self-management of symptoms. Outlined signs and symptoms indicating need for more acute intervention. Patient verbalized understanding and all questions were answered. Patient received an After-Visit Summary.    No orders of the defined types were placed in this encounter.  No orders of the defined types were placed in this encounter.  Referral Orders  No referral(s) requested  today     Note is dictated utilizing voice recognition software. Although note has been proof read prior to signing, occasional typographical errors still can be missed. If any questions arise, please do not hesitate to call for verification.   electronically signed by:  Howard Pouch, DO  [place]

## 2023-03-21 NOTE — Progress Notes (Unsigned)
Follow Up Note  RE: Suzanne Jarvis MRN: [id number] DOB: 1991-07-06 Date of Office Visit: 03/22/2023  Referring provider: Natalia Leatherwood, DO Primary care provider: Natalia Leatherwood, DO  Chief Complaint: No chief complaint on file.  History of Present Illness: I had the pleasure of seeing Suzanne Jarvis for a follow up visit at the [facility] on 03/21/2023. She is a 32 y.o. female, who is being followed for allergic rhinoconjunctivitis, frequent upper respiratory infections and multiple drug allergies. Her previous allergy office visit was on 09/21/2022 with Dr. Selena Batten. Today is a regular follow up visit.  Seasonal and perennial allergic rhinoconjunctivitis Past history - Rhino conjunctivitis symptoms from the spring through fall for the past 5 years. 2 sinus infections per year. 2021 skin testing showed: Positive to grass, weed, ragweed, trees, cat, mold, dog, dust mites. Borderline to cockroach.  Negative to common foods. Started AIT on 05/20/2020 (G-W-RW-T and M-C-D-CR-DM). Epistaxis with nasal sprays.  Interim history -  Carbinoxamine caused itching, stopped Singulair due to concerns about side effects, wants to stop AIT as they are painful. Stop allergy injections - paper signed. Continue environmental control measures. Take Xyzal and Claritin daily. May restart Singulair (montelukast) 10mg  daily at night. May use olopatadine eye drops 0.1% twice a day as needed for itchy/watery eyes. Use refresh eye drops. Nasal saline spray (i.e., Simply Saline) or nasal saline lavage (i.e., NeilMed) is recommended as needed and prior to medicated nasal sprays.   History of frequent upper respiratory infection Past history - Notes frequent sinus infections needing frequent antibiotics. 2022 bloodwork - normal immunoglobulin levels, protective titers for tetanus/diptheria, pneumococcal titers were adequate but not robust. Interim history - no antibiotics.  Keep track of  infections/antibiotics use.   Multiple drug allergies Past history - Broke out in hives after taking penicillin. Adverse reaction to aspirin - most likely non-IgE mediated. Tolerates other NSAIDs.  Consider penicillin skin testing/drug challenge in the future. You have to stop Xyzal and Claritin for 3 days and have to be in the office for 2-3 hours. More than 90% of patients outgrow their penicillin allergy. Avoid aspirin.   Return in about 6 months (around 03/22/2023).   01/24/2023 UC visit: "Sinus infections are usually caused by viruses.  You have underlying allergies.  Continue your allergy medicine.  You have received a shot of steroid to help with symptoms.  Make sure you are drinking lots of fluids.  Consider using Flonase for a few days until congestion improves If you get worse instead of better, run fever, or have increased pressure and pain in your sinus fill and take the doxycycline Follow-up with your primary care doctor"  02/22/2023 PCP visit: "Seasonal allergic rhinitis due to pollen-cough It does not appear infectious today.  She has very mild swelling of the left nasal turbinate, otherwise exam is normal with the exception of postnasal drip.  I suspect this is what is causing her cough. Encouraged her to continue her antihistamine regimen. Start Astelin nasal spray Restart Singulair Atrovent nasal spray prescribed to help with symptoms. Briefly discussed.  Nasal Navage and air filtration to help with chronic allergies"  Assessment and Plan: Suzanne Jarvis is a 32 y.o. female with: No problem-specific Assessment & Plan notes found for this encounter.  No follow-ups on file.  No orders of the defined types were placed in this encounter.  Lab Orders  No laboratory test(s) ordered today    Diagnostics: Spirometry:  Tracings reviewed. Her effort: {Blank  single:19197::"Good reproducible efforts.","It was hard to get consistent efforts and there is a question as to whether this  reflects a maximal maneuver.","Poor effort, data can not be interpreted."} FVC: ***L FEV1: ***L, ***% predicted FEV1/FVC ratio: ***% Interpretation: {Blank single:19197::"Spirometry consistent with mild obstructive disease","Spirometry consistent with moderate obstructive disease","Spirometry consistent with severe obstructive disease","Spirometry consistent with possible restrictive disease","Spirometry consistent with mixed obstructive and restrictive disease","Spirometry uninterpretable due to technique","Spirometry consistent with normal pattern","No overt abnormalities noted given today's efforts"}.  Please see scanned spirometry results for details.  Skin Testing: {Blank single:19197::"Select foods","Environmental allergy panel","Environmental allergy panel and select foods","Food allergy panel","None","Deferred due to recent antihistamines use"}. *** Results discussed with patient/family.   Medication List:  Current Outpatient Medications  Medication Sig Dispense Refill   ascorbic acid (VITAMIN C) 500 MG tablet Take by mouth.     azelastine (ASTELIN) 0.1 % nasal spray Place 1 spray into both nostrils 2 (two) times daily. Use in each nostril as directed 30 mL 12   colchicine 0.6 MG tablet Take 0.6 mg by mouth as needed.     Cyanocobalamin (B-12) 1000 MCG TABS Take 2 tablets by mouth daily.     dicyclomine (BENTYL) 10 MG capsule Take 1 capsule (10 mg total) by mouth 4 (four) times daily as needed for spasms. TAKE 1 CAPSULE BY MOUTH FOUR TIMES DAILY BEFORE MEALS AND AT BEDTIME PRN 120 capsule 11   diphenoxylate-atropine (LOMOTIL) 2.5-0.025 MG tablet TAKE 1 TABLET BY MOUTH FOUR TIMES DAILY AS NEEDED FOR DIARRHEA OR LOOSE STOOLS 180 tablet 1   drospirenone-ethinyl estradiol (YAZ,GIANVI,LORYNA) 3-0.02 MG tablet Take 1 tablet by mouth daily.     ipratropium (ATROVENT) 0.06 % nasal spray Place 2 sprays into both nostrils 4 (four) times daily. 15 mL 12   levocetirizine (XYZAL) 5 MG tablet Take 1  tablet (5 mg total) by mouth every evening. 90 tablet 3   Magnesium 100 MG TABS Take by mouth. W/ calcium and zinc     montelukast (SINGULAIR) 10 MG tablet Take 1 tablet (10 mg total) by mouth at bedtime. 90 tablet 1   Multiple Vitamins-Minerals (MULTIVITAMIN WITH MINERALS) tablet Take 1 tablet by mouth daily.     olopatadine (PATANOL) 0.1 % ophthalmic solution Place 1 drop into both eyes 2 (two) times daily as needed (itchy/watery eyes). 5 mL 5   Vitamin D, Cholecalciferol, 25 MCG (1000 UT) CAPS Take by mouth.     No current facility-administered medications for this visit.   Allergies: Allergies  Allergen Reactions   Aspirin Anaphylaxis   Adrenal Cortex Extract Hives   Amlodipine Other (See Comments)    Per patient burning sensation through body   Amoxicillin Hives   Hyoscyamine Other (See Comments)   Mixed Grasses    Novocain [Procaine] Swelling   Other      grass, weed, ragweed, trees, cat, mold, dog, dust mites. Borderline to cockroach.     Paxil [Paroxetine Hcl]     Dizziness    Penicillins     REACTION: hives   I reviewed her past medical history, social history, family history, and environmental history and no significant changes have been reported from her previous visit.  Review of Systems  Constitutional:  Negative for appetite change, chills, fever and unexpected weight change.  HENT:  Negative for congestion, postnasal drip, rhinorrhea and sneezing.   Eyes:  Positive for itching.  Respiratory:  Negative for cough, chest tightness, shortness of breath and wheezing.   Cardiovascular:  Negative for chest pain.  Gastrointestinal:  Negative for abdominal pain.  Genitourinary:  Negative for difficulty urinating.  Skin:  Negative for rash.  Allergic/Immunologic: Positive for environmental allergies. Negative for food allergies.  Neurological:  Negative for headaches.    Objective: There were no vitals taken for this visit. There is no height or weight on file to  calculate BMI. Physical Exam Vitals and nursing note reviewed.  Constitutional:      Appearance: Normal appearance. She is well-developed and normal weight.  HENT:     Head: Normocephalic and atraumatic.     Right Ear: Tympanic membrane and external ear normal.     Left Ear: Tympanic membrane and external ear normal.     Nose: Nose normal.     Mouth/Throat:     Mouth: Mucous membranes are moist.     Pharynx: Oropharynx is clear.  Eyes:     Conjunctiva/sclera: Conjunctivae normal.  Cardiovascular:     Rate and Rhythm: Normal rate and regular rhythm.     Heart sounds: Normal heart sounds. No murmur heard.    No friction rub. No gallop.  Pulmonary:     Effort: Pulmonary effort is normal.     Breath sounds: Normal breath sounds. No wheezing, rhonchi or rales.  Abdominal:     Palpations: Abdomen is soft.  Musculoskeletal:     Cervical back: Neck supple.  Skin:    General: Skin is warm.     Findings: No rash.  Neurological:     Mental Status: She is alert and oriented to person, place, and time.  Psychiatric:        Mood and Affect: Mood normal.        Behavior: Behavior normal.    Previous notes and tests were reviewed. The plan was reviewed with the patient/family, and all questions/concerned were addressed.  It was my pleasure to see Suzanne Jarvis today and participate in her care. Please feel free to contact me with any questions or concerns.  Sincerely,  Wyline Mood, DO Allergy & Immunology  [facility] office: [phone] [facility] office: [phone]

## 2023-03-22 ENCOUNTER — Encounter: Payer: Self-pay | Admitting: Allergy

## 2023-03-22 ENCOUNTER — Ambulatory Visit: Payer: 59 | Admitting: Allergy

## 2023-03-22 ENCOUNTER — Other Ambulatory Visit: Payer: Self-pay

## 2023-03-22 VITALS — BP 122/86 | HR 89 | Temp 98.3°F | Ht 65.0 in | Wt 141.6 lb

## 2023-03-22 DIAGNOSIS — J302 Other seasonal allergic rhinitis: Secondary | ICD-10-CM | POA: Diagnosis not present

## 2023-03-22 DIAGNOSIS — R030 Elevated blood-pressure reading, without diagnosis of hypertension: Secondary | ICD-10-CM

## 2023-03-22 DIAGNOSIS — Z889 Allergy status to unspecified drugs, medicaments and biological substances status: Secondary | ICD-10-CM

## 2023-03-22 DIAGNOSIS — I10 Essential (primary) hypertension: Secondary | ICD-10-CM | POA: Insufficient documentation

## 2023-03-22 DIAGNOSIS — J3089 Other allergic rhinitis: Secondary | ICD-10-CM

## 2023-03-22 DIAGNOSIS — K219 Gastro-esophageal reflux disease without esophagitis: Secondary | ICD-10-CM | POA: Diagnosis not present

## 2023-03-22 DIAGNOSIS — H1010 Acute atopic conjunctivitis, unspecified eye: Secondary | ICD-10-CM | POA: Diagnosis not present

## 2023-03-22 DIAGNOSIS — Z8709 Personal history of other diseases of the respiratory system: Secondary | ICD-10-CM

## 2023-03-22 DIAGNOSIS — J4520 Mild intermittent asthma, uncomplicated: Secondary | ICD-10-CM | POA: Diagnosis not present

## 2023-03-22 HISTORY — DX: Gastro-esophageal reflux disease without esophagitis: K21.9

## 2023-03-22 MED ORDER — ALBUTEROL SULFATE HFA 108 (90 BASE) MCG/ACT IN AERS: 2.0000 | INHALATION_SPRAY | RESPIRATORY_TRACT | 1 refills | Status: DC | PRN

## 2023-03-22 NOTE — Assessment & Plan Note (Signed)
Monitor and follow up with PCP regarding this.  Repeat blood pressure was 122/86 which is normal.

## 2023-03-22 NOTE — Assessment & Plan Note (Signed)
Coughing worse when laying down? See handout for lifestyle and dietary modifications.

## 2023-03-22 NOTE — Assessment & Plan Note (Signed)
Past history - Notes frequent sinus infections needing frequent antibiotics. 2022 bloodwork - normal immunoglobulin levels, protective titers for tetanus/diptheria, pneumococcal titers were adequate but not robust. Interim history - 1 antibiotics.  Keep track of infections/antibiotics use.

## 2023-03-22 NOTE — Assessment & Plan Note (Signed)
Past history - Rhino conjunctivitis symptoms from the spring through fall for the past 5 years. 2 sinus infections per year. 2021 skin testing showed: Positive to grass, weed, ragweed, trees, cat, mold, dog, dust mites. Borderline to cockroach.  Negative to common foods. Started AIT on 05/20/2020 (G-W-RW-T and M-C-D-CR-DM) and stopped in 2023 due to pain. Epistaxis with nasal sprays.  Interim history -  no worsening symptoms but had possible sinus infection and now lingering cough since off AIT. Continue environmental control measures. Take Xyzal and Allegra daily. Restart Singulair (montelukast) 10mg  daily at night. May use eye drops as needed.  Nasal saline spray (i.e., Simply Saline) or nasal saline lavage (i.e., NeilMed) is recommended as needed and prior to medicated nasal sprays.

## 2023-03-22 NOTE — Assessment & Plan Note (Signed)
Lingering dry cough post respiratory infection 2 months ago. H/o albuterol use with bronchitis. Today's spirometry was unremarkable with no improvement in FEV1 post bronchodilator treatment. Clinically feeling slightly improved.  Possible she could have post infectious cough and she stopped Singulair.  Restart Singulair (montelukast) 10mg  daily at night. May use albuterol rescue inhaler 2 puffs every 4 to 6 hours as needed for shortness of breath, chest tightness, coughing, and wheezing.  Monitor frequency of use - if you need to use it more than twice per week on a consistent basis let us know.  If symptoms do not improve, advised patient to make a sooner follow up.

## 2023-03-22 NOTE — Patient Instructions (Addendum)
Environmental allergies 2021 skin testing was Positive to grass, weed, ragweed, trees, cat, mold, dog, dust mites. Borderline to cockroach.  Continue nvironmental control measures. Take Xyzal and Allegra daily. Restart Singulair (montelukast) 10mg  daily at night. May use eye drops as needed.  Nasal saline spray (i.e., Simply Saline) or nasal saline lavage (i.e., NeilMed) is recommended as needed and prior to medicated nasal sprays.  Breathing/coughing Restart Singulair (montelukast) 10mg  daily at night. May use albuterol rescue inhaler 2 puffs every 4 to 6 hours as needed for shortness of breath, chest tightness, coughing, and wheezing.  Monitor frequency of use - if you need to use it more than twice per week on a consistent basis let us know.  If not improving make a quicker follow up visit.   Heartburn: See handout for lifestyle and dietary modifications.  Sinus infections Keep track of infections/antibiotics.  Aspirin Continue to avoid for now.  Penicillin allergy Consider penicillin skin testing/drug challenge in the future. You have to stop xyzal and claritin for 3 days and have to be in the office for 2-3 hours. More than 90% of patients outgrow their penicillin allergy.  Drug challenge instructions: You must be off antihistamines for 3-5 days before. Must be in good health and not ill. No vaccines/injections/antibiotics within the past 7 days. Plan on being in the office for 2-3 hours and must bring in the drug you want to do the oral challenge for - will send in prescription to pick up a few days before. You must call to schedule an appointment and specify it's for a drug challenge.   Other Minimize/avoid contact with perm solutions.  Blood pressure Monitor and follow up with PCP regarding this.  Repeat blood pressure was 122/86  Follow up in 6 months or sooner if needed.

## 2023-03-22 NOTE — Assessment & Plan Note (Signed)
Past history - Broke out in hives after taking penicillin. Adverse reaction to aspirin - most likely non-IgE mediated. Tolerates other NSAIDs.  Consider penicillin skin testing/drug challenge in the future. You have to stop Xyzal and Claritin for 3 days and have to be in the office for 2-3 hours. More than 90% of patients outgrow their penicillin allergy. Avoid aspirin. 

## 2023-05-15 DIAGNOSIS — M359 Systemic involvement of connective tissue, unspecified: Secondary | ICD-10-CM | POA: Diagnosis not present

## 2023-05-15 DIAGNOSIS — Z79899 Other long term (current) drug therapy: Secondary | ICD-10-CM | POA: Diagnosis not present

## 2023-05-17 ENCOUNTER — Ambulatory Visit (INDEPENDENT_AMBULATORY_CARE_PROVIDER_SITE_OTHER): Payer: 59 | Admitting: Family Medicine

## 2023-05-17 ENCOUNTER — Encounter: Payer: Self-pay | Admitting: Family Medicine

## 2023-05-17 VITALS — BP 126/86 | HR 86 | Temp 99.3°F | Ht 65.0 in | Wt 141.4 lb

## 2023-05-17 DIAGNOSIS — I10 Essential (primary) hypertension: Secondary | ICD-10-CM | POA: Diagnosis not present

## 2023-05-17 MED ORDER — AMLODIPINE BESYLATE 2.5 MG PO TABS: 2.5000 mg | ORAL_TABLET | Freq: Every day | ORAL | 1 refills | Status: DC

## 2023-05-17 NOTE — Patient Instructions (Signed)
Return in about 3 weeks (around 06/07/2023) for nurse visit BP recheck .        Great to see you today.  I have refilled the medication(s) we provide.   If labs were collected, we will inform you of lab results once received either by echart message or telephone call.   - echart message- for normal results that have been seen by the patient already.   - telephone call: abnormal results or if patient has not viewed results in their echart.

## 2023-05-17 NOTE — Progress Notes (Signed)
CARALINA Jarvis , 15-Sep-1991, 32 y.o., female MRN: [id number] Patient Care Team    Relationship Specialty Notifications Start End  Natalia Leatherwood, DO PCP - General Family Medicine  09/04/15   Rodell Perna, NP (Inactive) Nurse Practitioner Obstetrics and Gynecology  01/29/20   Louann Sjogren, DPM Consulting Physician Podiatry  01/03/22     Chief Complaint  Patient presents with   Blood pressure     Pt has at home blood pressure cuff, been checking regularly and has been getting higher numbers (145/89). Been experiencing light-headedness and nausea. Been feeling these symptoms for a few months, but just recently started noticing the high BP.      Subjective: Suzanne Jarvis is a 32 y.o. Pt presents for an OV with complaints of lightheadedness and nausea for 2 months. Starting checking BP and noticed ranges around 145/89.  States that blood pressures are elevated when she is having the symptoms.     05/17/2023    2:13 PM 02/22/2023    8:33 AM 10/19/2022    9:34 AM 09/29/2021    9:23 AM 10/14/2020   10:36 AM  Depression screen PHQ 2/9  Decreased Interest 0 0 0 0 0  Down, Depressed, Hopeless 0 0 0 0 0  PHQ - 2 Score 0 0 0 0 0  Altered sleeping 2 1     Tired, decreased energy 2 2     Change in appetite 0 0     Feeling bad or failure about yourself  0 0     Trouble concentrating 1 1     Moving slowly or fidgety/restless 0 0     Suicidal thoughts 0 0     PHQ-9 Score 5 4     Difficult doing work/chores Not difficult at all Not difficult at all       Allergies  Allergen Reactions   Aspirin Anaphylaxis   Adrenal Cortex Extract Hives   Amoxicillin Hives   Hyoscyamine Other (See Comments)   Mixed Grasses    Novocain [Procaine] Swelling   Other      grass, weed, ragweed, trees, cat, mold, dog, dust mites. Borderline to cockroach.     Paxil [Paroxetine Hcl]     Dizziness    Penicillins     REACTION: hives   Social History   Social History Narrative   Right Handed     Lives in a two story condo on the second floor    Goes to the gyn 3 times a week.      Has a Allena Earing that she called her son    Past Medical History:  Diagnosis Date   ADD (attention deficit disorder) 07/08/2020   Angio-edema    ASCUS with positive high risk HPV cervical 03/26/2019   01/2019; negative colposcopy 03/2019; plan repeat pap and HPV test 01/2020   Asthma    Chronic headaches    Concussion with no loss of consciousness 07/08/2020   May 2021   Eczema    IBS (irritable bowel syndrome)    Penicillin allergy 04/28/2020   Seasonal and perennial allergic rhinoconjunctivitis 07/28/2020   Tonsillitis    Urticaria    Past Surgical History:  Procedure Laterality Date   WISDOM TOOTH EXTRACTION     Family History  Problem Relation Age of Onset   Allergies Mother    Asthma Mother    Hypertension Mother    Allergic rhinitis Mother    Urticaria Mother  Healthy Father    Allergic rhinitis Sister    Eczema Sister    Anemia Sister    Hashimoto's thyroiditis Brother    ADD / ADHD Brother    Arthritis Maternal Grandmother    Irritable bowel syndrome Maternal Grandmother    Breast cancer Maternal Grandmother    Lupus Cousin    Hashimoto's thyroiditis Cousin    Lung cancer Other        paternal great grandfather   Lung cancer Other        maternal great grandfather   Pancreatic cancer Other        great aunt   Colon cancer Neg Hx    Allergies as of 05/17/2023       Reactions   Aspirin Anaphylaxis   Adrenal Cortex Extract Hives   Amoxicillin Hives   Hyoscyamine Other (See Comments)   Mixed Grasses    Novocain [procaine] Swelling   Other     grass, weed, ragweed, trees, cat, mold, dog, dust mites. Borderline to cockroach.     Paxil [paroxetine Hcl]    Dizziness   Penicillins    REACTION: hives        Medication List        Accurate as of May 17, 2023  3:37 PM. If you have any questions, ask your nurse or doctor.          STOP taking these  medications    azelastine 0.1 % nasal spray Commonly known as: ASTELIN Stopped by: Felix Pacini, DO   ipratropium 0.06 % nasal spray Commonly known as: ATROVENT Stopped by: Felix Pacini, DO   olopatadine 0.1 % ophthalmic solution Commonly known as: Patanol Stopped by: Felix Pacini, DO       TAKE these medications    albuterol 108 (90 Base) MCG/ACT inhaler Commonly known as: Ventolin HFA Inhale 2 puffs into the lungs every 4 (four) hours as needed for wheezing or shortness of breath (coughing fits).   amLODipine 2.5 MG tablet Commonly known as: NORVASC Take 1 tablet (2.5 mg total) by mouth daily. Started by: Felix Pacini, DO   ascorbic acid 500 MG tablet Commonly known as: VITAMIN C Take by mouth.   B-12 1000 MCG Tabs Take 2 tablets by mouth daily.   colchicine 0.6 MG tablet Take 0.6 mg by mouth as needed.   dicyclomine 10 MG capsule Commonly known as: BENTYL Take 1 capsule (10 mg total) by mouth 4 (four) times daily as needed for spasms. TAKE 1 CAPSULE BY MOUTH FOUR TIMES DAILY BEFORE MEALS AND AT BEDTIME PRN   diphenoxylate-atropine 2.5-0.025 MG tablet Commonly known as: LOMOTIL TAKE 1 TABLET BY MOUTH FOUR TIMES DAILY AS NEEDED FOR DIARRHEA OR LOOSE STOOLS   drospirenone-ethinyl estradiol 3-0.02 MG tablet Commonly known as: YAZ Take 1 tablet by mouth daily.   levocetirizine 5 MG tablet Commonly known as: XYZAL Take 1 tablet (5 mg total) by mouth every evening.   Magnesium 100 MG Tabs Take by mouth. W/ calcium and zinc   montelukast 10 MG tablet Commonly known as: SINGULAIR Take 1 tablet (10 mg total) by mouth at bedtime.   multivitamin with minerals tablet Take 1 tablet by mouth daily.   Vitamin D (Cholecalciferol) 25 MCG (1000 UT) Caps Take by mouth.        All past medical history, surgical history, allergies, family history, immunizations andmedications were updated in the EMR today and reviewed under the history and medication portions of  their EMR.  ROS Negative, with the exception of above mentioned in HPI   Objective:  BP 126/86   Pulse 86   Temp 99.3 F (37.4 C)   Ht 5\' 5"  (1.651 m)   Wt 141 lb 6.4 oz (64.1 kg)   SpO2 98%   BMI 23.53 kg/m  Body mass index is 23.53 kg/m. Physical Exam Vitals and nursing note reviewed.  Constitutional:      General: She is not in acute distress.    Appearance: Normal appearance. She is not ill-appearing, toxic-appearing or diaphoretic.  HENT:     Head: Normocephalic and atraumatic.  Eyes:     General: No scleral icterus.       Right eye: No discharge.        Left eye: No discharge.     Extraocular Movements: Extraocular movements intact.     Conjunctiva/sclera: Conjunctivae normal.     Pupils: Pupils are equal, round, and reactive to light.  Cardiovascular:     Rate and Rhythm: Normal rate and regular rhythm.  Pulmonary:     Effort: Pulmonary effort is normal. No respiratory distress.     Breath sounds: Normal breath sounds. No wheezing, rhonchi or rales.  Musculoskeletal:     Right lower leg: No edema.     Left lower leg: No edema.  Skin:    General: Skin is warm.     Findings: No rash.  Neurological:     Mental Status: She is alert and oriented to person, place, and time. Mental status is at baseline.     Motor: No weakness.     Gait: Gait normal.  Psychiatric:        Mood and Affect: Mood normal.        Behavior: Behavior normal.        Thought Content: Thought content normal.        Judgment: Judgment normal.    No results found. No results found. No results found for this or any previous visit (from the past 24 hour(s)).  Assessment/Plan: JOANETTA HUMM is a 32 y.o. female present for OV for  hypertension: Systolics are normal, however diastolics are elevated and have been borderline to elevated over the last year. We discussed low-sodium diet and exercise.  She routinely works out at [employer] multiple times a week. Start amlodipine 2.5 mg  before bed. Follow-up in 2-3 weeks for nurse visit to ensure blood pressures have returned to normal.  Reviewed expectations re: course of current medical issues. Discussed self-management of symptoms. Outlined signs and symptoms indicating need for more acute intervention. Patient verbalized understanding and all questions were answered. Patient received an After-Visit Summary.    No orders of the defined types were placed in this encounter.  Meds ordered this encounter  Medications   amLODipine (NORVASC) 2.5 MG tablet    Sig: Take 1 tablet (2.5 mg total) by mouth daily.    Dispense:  90 tablet    Refill:  1   Referral Orders  No referral(s) requested today     Note is dictated utilizing voice recognition software. Although note has been proof read prior to signing, occasional typographical errors still can be missed. If any questions arise, please do not hesitate to call for verification.   electronically signed by:  Felix Pacini, DO  [facility] [place]

## 2023-05-31 DIAGNOSIS — Z8249 Family history of ischemic heart disease and other diseases of the circulatory system: Secondary | ICD-10-CM | POA: Diagnosis not present

## 2023-05-31 DIAGNOSIS — Z880 Allergy status to penicillin: Secondary | ICD-10-CM | POA: Diagnosis not present

## 2023-05-31 DIAGNOSIS — K581 Irritable bowel syndrome with constipation: Secondary | ICD-10-CM | POA: Diagnosis not present

## 2023-05-31 DIAGNOSIS — M359 Systemic involvement of connective tissue, unspecified: Secondary | ICD-10-CM | POA: Diagnosis not present

## 2023-05-31 DIAGNOSIS — J45909 Unspecified asthma, uncomplicated: Secondary | ICD-10-CM | POA: Diagnosis not present

## 2023-05-31 DIAGNOSIS — G629 Polyneuropathy, unspecified: Secondary | ICD-10-CM | POA: Diagnosis not present

## 2023-05-31 DIAGNOSIS — I10 Essential (primary) hypertension: Secondary | ICD-10-CM | POA: Diagnosis not present

## 2023-05-31 DIAGNOSIS — Z87820 Personal history of traumatic brain injury: Secondary | ICD-10-CM | POA: Diagnosis not present

## 2023-05-31 DIAGNOSIS — Z791 Long term (current) use of non-steroidal anti-inflammatories (NSAID): Secondary | ICD-10-CM | POA: Diagnosis not present

## 2023-05-31 DIAGNOSIS — Z803 Family history of malignant neoplasm of breast: Secondary | ICD-10-CM | POA: Diagnosis not present

## 2023-05-31 DIAGNOSIS — Z886 Allergy status to analgesic agent status: Secondary | ICD-10-CM | POA: Diagnosis not present

## 2023-06-07 ENCOUNTER — Ambulatory Visit (INDEPENDENT_AMBULATORY_CARE_PROVIDER_SITE_OTHER): Payer: 59

## 2023-06-07 VITALS — BP 128/82

## 2023-06-07 DIAGNOSIS — I10 Essential (primary) hypertension: Secondary | ICD-10-CM | POA: Diagnosis not present

## 2023-06-07 NOTE — Progress Notes (Signed)
Suzanne Jarvis is a 32 y.o. female presents to the office today for Blood pressure recheck secondary to primary hypertension [Ex: elevated BP in office, med start, regimen change].  Blood pressure medication: amLODipine (NORVASC) 2.5 MG tablet , 1 tablet by mouth daily If on medication, Last dose was at least 1-2 hours prior to recheck: No Blood pressure was taken in the left arm after patient rested for 5-10 minutes.   BP 130/74 (BP Location: Left Arm, Patient Position: Sitting, Cuff Size: Normal)  BP 128/82 (BP location: left wrist, patient position: sitting, cuff size: normal  Ora Mcnatt D Dynasia Kercheval   Please review and advise if any other suggestions

## 2023-06-07 NOTE — Progress Notes (Signed)
Please advise pt in the future to have BP med in system at least 2 hrs before BP check/appts.  She can either make another nurse visit to have it rechecked with the blood pressure medication fully in her system, or if she is feeling okay on the medication we can recheck her at her next appointment.  Electronically Signed by: Felix Pacini, DO [facility] [place]

## 2023-06-07 NOTE — Addendum Note (Signed)
Addended by: Felix Pacini A on: 06/07/2023 02:13 PM   Modules accepted: Level of Service

## 2023-08-23 ENCOUNTER — Ambulatory Visit
Admission: RE | Admit: 2023-08-23 | Discharge: 2023-08-23 | Disposition: A | Discharge status: Home / Self Care | Payer: 59 | Source: Ambulatory Visit | Attending: Emergency Medicine | Admitting: Emergency Medicine

## 2023-08-23 VITALS — BP 144/86 | HR 87 | Temp 99.2°F | Resp 16

## 2023-08-23 DIAGNOSIS — J069 Acute upper respiratory infection, unspecified: Secondary | ICD-10-CM

## 2023-08-23 LAB — POCT RAPID STREP A (OFFICE): Rapid Strep A Screen: NEGATIVE

## 2023-08-23 NOTE — Discharge Instructions (Addendum)
Strep test is negative! I recommend adding nasal spray such as flonase to your allergy regimen Mucinex DM (guaifenesin-dextromethorphan) will help with sinus congestion and cough. Drink lots of fluids while using this medicine! Tylenol/ibuprofen if needed for aches or fever

## 2023-08-23 NOTE — ED Provider Notes (Signed)
[name]    CSN: [id number] Arrival date & time: 08/23/23  0800     History   Chief Complaint Chief Complaint  Patient presents with   Sore Throat    Drainage, throat hurts, Productive cough, clear mucus, no temp - Entered by patient    HPI Suzanne Jarvis is a 32 y.o. female.  Yesterday developed sore throat, nasal congestion, PND and slight cough Also reporting some left ear pain She just returned from a trip to [place], partner is sick too Has used ibuprofen  No known fever. Not having wheezing or shob  Hx allergies and asthma Takes xyzal and montelukast   Also had a client with +strep and would like to be tested  Past Medical History:  Diagnosis Date   ADD (attention deficit disorder) 07/08/2020   Angio-edema    ASCUS with positive high risk HPV cervical 03/26/2019   01/2019; negative colposcopy 03/2019; plan repeat pap and HPV test 01/2020   Asthma    Chronic headaches    Concussion with no loss of consciousness 07/08/2020   May 2021   Eczema    IBS (irritable bowel syndrome)    Penicillin allergy 04/28/2020   Seasonal and perennial allergic rhinoconjunctivitis 07/28/2020   Tonsillitis    Urticaria     Patient Active Problem List   Diagnosis Date Noted   Gastroesophageal reflux disease 03/22/2023   Reactive airway disease, mild intermittent, uncomplicated 03/22/2023   Elevated blood pressure reading 03/22/2023   Multiple drug allergies 09/21/2022   Aphthous ulcer of mouth 09/29/2021   Metatarsalgia of right foot 07/07/2021   Bilateral hand numbness 04/14/2021   Positive ANA (antinuclear antibody) 02/24/2021   History of frequent upper respiratory infection 01/27/2021   Seasonal allergies 01/14/2021   Seasonal and perennial allergic rhinoconjunctivitis 07/28/2020   History of brain concussion 07/08/2020   ADD (attention deficit disorder) 07/08/2020   IBS (irritable bowel syndrome) 09/30/2015   Vitamin B 12 deficiency 12/10/2014    Past  Surgical History:  Procedure Laterality Date   WISDOM TOOTH EXTRACTION      OB History   No obstetric history on file.      Home Medications    Prior to Admission medications   Medication Sig Start Date End Date Taking? Authorizing Provider  albuterol (VENTOLIN HFA) 108 (90 Base) MCG/ACT inhaler Inhale 2 puffs into the lungs every 4 (four) hours as needed for wheezing or shortness of breath (coughing fits). 03/22/23  Yes Ellamae Sia, DO  amLODipine (NORVASC) 2.5 MG tablet Take 1 tablet (2.5 mg total) by mouth daily. 05/17/23  Yes [name], [name], DO  ascorbic acid (VITAMIN C) 500 MG tablet Take by mouth.   Yes [provider]  colchicine 0.6 MG tablet Take 0.6 mg by mouth as needed.   Yes [provider]  Cyanocobalamin (B-12) 1000 MCG TABS Take 2 tablets by mouth daily.   Yes [provider]  dicyclomine (BENTYL) 10 MG capsule Take 1 capsule (10 mg total) by mouth 4 (four) times daily as needed for spasms. TAKE 1 CAPSULE BY MOUTH FOUR TIMES DAILY BEFORE MEALS AND AT BEDTIME PRN 10/19/22  Yes [name], [name], DO  diphenoxylate-atropine (LOMOTIL) 2.5-0.025 MG tablet TAKE 1 TABLET BY MOUTH FOUR TIMES DAILY AS NEEDED FOR DIARRHEA OR LOOSE STOOLS 05/25/22  Yes [name], [name], DO  drospirenone-ethinyl estradiol (YAZ,GIANVI,LORYNA) 3-0.02 MG tablet Take 1 tablet by mouth daily.   Yes [provider]  levocetirizine (XYZAL) 5 MG tablet  Take 1 tablet (5 mg total) by mouth every evening. 09/21/22  Yes Ellamae Sia, DO  Magnesium 100 MG TABS Take by mouth. W/ calcium and zinc   Yes [provider]  montelukast (SINGULAIR) 10 MG tablet Take 1 tablet (10 mg total) by mouth at bedtime. 02/22/23  Yes [name], [name], DO  Multiple Vitamins-Minerals (MULTIVITAMIN WITH MINERALS) tablet Take 1 tablet by mouth daily.    [provider]  Vitamin D, Cholecalciferol, 25 MCG (1000 UT) CAPS Take by mouth.    [provider]    Family History Family  History  Problem Relation Age of Onset   Allergies Mother    Asthma Mother    Hypertension Mother    Allergic rhinitis Mother    Urticaria Mother    Healthy Father    Allergic rhinitis Sister    Eczema Sister    Anemia Sister    Hashimoto's thyroiditis Brother    ADD / ADHD Brother    Arthritis Maternal Grandmother    Irritable bowel syndrome Maternal Grandmother    Breast cancer Maternal Grandmother    Lupus Cousin    Hashimoto's thyroiditis Cousin    Lung cancer Other        paternal great grandfather   Lung cancer Other        maternal great grandfather   Pancreatic cancer Other        great aunt   Colon cancer Neg Hx     Social History Social History   Tobacco Use   Smoking status: Former    Current packs/day: 0.00    Average packs/day: 0.3 packs/day for 6.0 years (1.5 ttl pk-yrs)    Types: Cigarettes    Start date: 2012    Quit date: 2018    Years since quitting: 6.7   Smokeless tobacco: Never  Vaping Use   Vaping status: Some Days  Substance Use Topics   Alcohol use: Yes    Comment: Rarely   Drug use: No     Allergies   Aspirin, Adrenal cortex extract, Amoxicillin, Hyoscyamine, Mixed grasses, Novocain [procaine], Other, Paxil [paroxetine hcl], and Penicillins   Review of Systems Review of Systems Per HPI  Physical Exam Triage Vital Signs ED Triage Vitals [08/23/23 0814]  Encounter Vitals Group     BP (!) 144/86     Systolic BP Percentile      Diastolic BP Percentile      Pulse Rate 87     Resp 16     Temp 99.2 F (37.3 C)     Temp Source Oral     SpO2 98 %     Weight      Height      Head Circumference      Peak Flow      Pain Score 4     Pain Loc      Pain Education      Exclude from Growth Chart    No data found.  Updated Vital Signs BP (!) 144/86 (BP Location: Left Arm)   Pulse 87   Temp 99.2 F (37.3 C) (Oral)   Resp 16   SpO2 98%   Physical Exam Vitals and nursing note reviewed.  Constitutional:      General: She is  not in acute distress.    Appearance: She is not ill-appearing.  HENT:     Right Ear: Tympanic membrane and ear canal normal.     Left Ear: Tympanic membrane and ear canal  normal.     Nose: No congestion or rhinorrhea.     Mouth/Throat:     Mouth: Mucous membranes are moist.     Pharynx: Oropharynx is clear. No posterior oropharyngeal erythema.     Tonsils: No tonsillar exudate or tonsillar abscesses. 1+ on the right. 1+ on the left.  Eyes:     Conjunctiva/sclera: Conjunctivae normal.  Cardiovascular:     Rate and Rhythm: Normal rate and regular rhythm.     Heart sounds: Normal heart sounds.  Pulmonary:     Effort: Pulmonary effort is normal.     Breath sounds: Normal breath sounds.  Musculoskeletal:     Cervical back: Normal range of motion.  Lymphadenopathy:     Cervical: No cervical adenopathy.  Skin:    General: Skin is warm and dry.  Neurological:     Mental Status: She is alert and oriented to person, place, and time.    UC Treatments / Results  Labs (all labs ordered are listed, but only abnormal results are displayed) Labs Reviewed  POCT RAPID STREP A (OFFICE)    EKG  Radiology No results found.  Procedures Procedures (including critical care time)  Medications Ordered in UC Medications - No data to display  Initial Impression / Assessment and Plan / UC Course  I have reviewed the triage vital signs and the nursing notes.  Pertinent labs & imaging results that were available during my care of the patient were reviewed by me and considered in my medical decision making (see chart for details).  Afebrile and well appearing  Rapid strep test per patient request is negative. Defer culture; no erythema, exudate, swelling, fever, LAD. Offered covid test, but patient reports she has home test she can use Discussed viral etiology and symptomatic care at home Can return if needed. Patient agreeable to plan  Final Clinical Impressions(s) / UC Diagnoses   Final  diagnoses:  Viral URI with cough     Discharge Instructions      Strep test is negative! I recommend adding nasal spray such as flonase to your allergy regimen Mucinex DM (guaifenesin-dextromethorphan) will help with sinus congestion and cough. Drink lots of fluids while using this medicine! Tylenol/ibuprofen if needed for aches or fever     ED Prescriptions   None    PDMP not reviewed this encounter.   Marlow Baars, [place] 08/23/23 9528

## 2023-08-23 NOTE — ED Triage Notes (Signed)
Sore throat, nasal congestion, runny nose, PND, cough, left ear pain and occasional dizziness starting yesterday. Reports recent travel to [place], and travel partners also aren't feeling well. Taking ibuprofen and using herbal tea at home, last dose ibuprofen yesterday.

## 2023-09-18 ENCOUNTER — Other Ambulatory Visit: Payer: Self-pay | Admitting: Allergy

## 2023-09-26 NOTE — Progress Notes (Unsigned)
Follow Up Note  RE: Suzanne Jarvis MRN: [id number] DOB: 10/11/1991 Date of Office Visit: 09/27/2023  Referring provider: Natalia Leatherwood, DO Primary care provider: Natalia Leatherwood, DO  Chief Complaint: No chief complaint on file.  History of Present Illness: I had the pleasure of seeing Suzanne Jarvis for a follow up visit at the [facility] on 09/26/2023. She is a 32 y.o. female, who is being followed for RAD, allergic rhino conjunctivitis, h/o frequent URIs, GERD, multiple drug allergies. Her previous allergy office visit was on 03/22/2023 with Dr. Selena Batten. Today is a regular follow up visit.  Discussed the use of AI scribe software for clinical note transcription with the patient, who gave verbal consent to proceed.  History of Present Illness            Reactive airway disease, mild intermittent, uncomplicated Lingering dry cough post respiratory infection 2 months ago. H/o albuterol use with bronchitis. Today's spirometry was unremarkable with no improvement in FEV1 post bronchodilator treatment. Clinically feeling slightly improved.  Possible she could have post infectious cough and she stopped Singulair.  Restart Singulair (montelukast) 10mg  daily at night. May use albuterol rescue inhaler 2 puffs every 4 to 6 hours as needed for shortness of breath, chest tightness, coughing, and wheezing.  Monitor frequency of use - if you need to use it more than twice per week on a consistent basis let us know.  If symptoms do not improve, advised patient to make a sooner follow up.    Seasonal and perennial allergic rhinoconjunctivitis Past history - Rhino conjunctivitis symptoms from the spring through fall for the past 5 years. 2 sinus infections per year. 2021 skin testing showed: Positive to grass, weed, ragweed, trees, cat, mold, dog, dust mites. Borderline to cockroach.  Negative to common foods. Started AIT on 05/20/2020 (G-W-RW-T and M-C-D-CR-DM) and stopped in 2023  due to pain. Epistaxis with nasal sprays.  Interim history -  no worsening symptoms but had possible sinus infection and now lingering cough since off AIT. Continue environmental control measures. Take Xyzal and Allegra daily. Restart Singulair (montelukast) 10mg  daily at night. May use eye drops as needed.  Nasal saline spray (i.e., Simply Saline) or nasal saline lavage (i.e., NeilMed) is recommended as needed and prior to medicated nasal sprays.   History of frequent upper respiratory infection Past history - Notes frequent sinus infections needing frequent antibiotics. 2022 bloodwork - normal immunoglobulin levels, protective titers for tetanus/diptheria, pneumococcal titers were adequate but not robust. Interim history - 1 antibiotics.  Keep track of infections/antibiotics use.   Gastroesophageal reflux disease Coughing worse when laying down? See handout for lifestyle and dietary modifications.   Elevated blood pressure reading Monitor and follow up with PCP regarding this.  Repeat blood pressure was 122/86 which is normal.    Multiple drug allergies Past history - Broke out in hives after taking penicillin. Adverse reaction to aspirin - most likely non-IgE mediated. Tolerates other NSAIDs.  Consider penicillin skin testing/drug challenge in the future. You have to stop Xyzal and Claritin for 3 days and have to be in the office for 2-3 hours. More than 90% of patients outgrow their penicillin allergy. Avoid aspirin.  Assessment and Plan: Suzanne Jarvis is a 32 y.o. female with: Reactive airway disease, mild intermittent, uncomplicated Lingering dry cough post respiratory infection 2 months ago. H/o albuterol use with bronchitis. Today's spirometry was unremarkable with no improvement in FEV1 post bronchodilator treatment. Clinically feeling slightly improved.  Possible she could have post infectious cough and she stopped Singulair.  Restart Singulair (montelukast) 10mg  daily at  night. May use albuterol rescue inhaler 2 puffs every 4 to 6 hours as needed for shortness of breath, chest tightness, coughing, and wheezing.  Monitor frequency of use - if you need to use it more than twice per week on a consistent basis let us know.  If symptoms do not improve, advised patient to make a sooner follow up.    Seasonal and perennial allergic rhinoconjunctivitis Past history - Rhino conjunctivitis symptoms from the spring through fall for the past 5 years. 2 sinus infections per year. 2021 skin testing showed: Positive to grass, weed, ragweed, trees, cat, mold, dog, dust mites. Borderline to cockroach.  Negative to common foods. Started AIT on 05/20/2020 (G-W-RW-T and M-C-D-CR-DM) and stopped in 2023 due to pain. Epistaxis with nasal sprays.  Interim history -  no worsening symptoms but had possible sinus infection and now lingering cough since off AIT. Continue environmental control measures. Take Xyzal and Allegra daily. Restart Singulair (montelukast) 10mg  daily at night. May use eye drops as needed.  Nasal saline spray (i.e., Simply Saline) or nasal saline lavage (i.e., NeilMed) is recommended as needed and prior to medicated nasal sprays.   History of frequent upper respiratory infection Past history - Notes frequent sinus infections needing frequent antibiotics. 2022 bloodwork - normal immunoglobulin levels, protective titers for tetanus/diptheria, pneumococcal titers were adequate but not robust. Interim history - 1 antibiotics.  Keep track of infections/antibiotics use.   Gastroesophageal reflux disease Coughing worse when laying down? See handout for lifestyle and dietary modifications.   Elevated blood pressure reading Monitor and follow up with PCP regarding this.  Repeat blood pressure was 122/86 which is normal.    Multiple drug allergies Past history - Broke out in hives after taking penicillin. Adverse reaction to aspirin - most likely non-IgE mediated.  Tolerates other NSAIDs.  Consider penicillin skin testing/drug challenge in the future. You have to stop Xyzal and Claritin for 3 days and have to be in the office for 2-3 hours. More than 90% of patients outgrow their penicillin allergy. Avoid aspirin. Assessment and Plan              No follow-ups on file.  No orders of the defined types were placed in this encounter.  Lab Orders  No laboratory test(s) ordered today    Diagnostics: Spirometry:  Tracings reviewed. Her effort: {Blank single:19197::"Good reproducible efforts.","It was hard to get consistent efforts and there is a question as to whether this reflects a maximal maneuver.","Poor effort, data can not be interpreted."} FVC: ***L FEV1: ***L, ***% predicted FEV1/FVC ratio: ***% Interpretation: {Blank single:19197::"Spirometry consistent with mild obstructive disease","Spirometry consistent with moderate obstructive disease","Spirometry consistent with severe obstructive disease","Spirometry consistent with possible restrictive disease","Spirometry consistent with mixed obstructive and restrictive disease","Spirometry uninterpretable due to technique","Spirometry consistent with normal pattern","No overt abnormalities noted given today's efforts"}.  Please see scanned spirometry results for details.  Skin Testing: {Blank single:19197::"Select foods","Environmental allergy panel","Environmental allergy panel and select foods","Food allergy panel","None","Deferred due to recent antihistamines use"}. *** Results discussed with patient/family.   Medication List:  Current Outpatient Medications  Medication Sig Dispense Refill   albuterol (VENTOLIN HFA) 108 (90 Base) MCG/ACT inhaler Inhale 2 puffs into the lungs every 4 (four) hours as needed for wheezing or shortness of breath (coughing fits). 18 g 1   amLODipine (NORVASC) 2.5 MG tablet Take 1 tablet (2.5 mg total) by mouth daily. 90  tablet 1   ascorbic acid (VITAMIN C) 500  MG tablet Take by mouth.     colchicine 0.6 MG tablet Take 0.6 mg by mouth as needed.     Cyanocobalamin (B-12) 1000 MCG TABS Take 2 tablets by mouth daily.     dicyclomine (BENTYL) 10 MG capsule Take 1 capsule (10 mg total) by mouth 4 (four) times daily as needed for spasms. TAKE 1 CAPSULE BY MOUTH FOUR TIMES DAILY BEFORE MEALS AND AT BEDTIME PRN 120 capsule 11   diphenoxylate-atropine (LOMOTIL) 2.5-0.025 MG tablet TAKE 1 TABLET BY MOUTH FOUR TIMES DAILY AS NEEDED FOR DIARRHEA OR LOOSE STOOLS 180 tablet 1   drospirenone-ethinyl estradiol (YAZ,GIANVI,LORYNA) 3-0.02 MG tablet Take 1 tablet by mouth daily.     levocetirizine (XYZAL) 5 MG tablet TAKE 1 TABLET BY MOUTH EVERY DAY IN THE EVENING 30 tablet 5   Magnesium 100 MG TABS Take by mouth. W/ calcium and zinc     montelukast (SINGULAIR) 10 MG tablet Take 1 tablet (10 mg total) by mouth at bedtime. 90 tablet 1   Multiple Vitamins-Minerals (MULTIVITAMIN WITH MINERALS) tablet Take 1 tablet by mouth daily.     Vitamin D, Cholecalciferol, 25 MCG (1000 UT) CAPS Take by mouth.     No current facility-administered medications for this visit.   Allergies: Allergies  Allergen Reactions   Aspirin Anaphylaxis   Adrenal Cortex Extract Hives   Amoxicillin Hives   Hyoscyamine Other (See Comments)   Mixed Grasses    Novocain [Procaine] Swelling   Other      grass, weed, ragweed, trees, cat, mold, dog, dust mites. Borderline to cockroach.     Paxil [Paroxetine Hcl]     Dizziness    Penicillins     REACTION: hives   I reviewed her past medical history, social history, family history, and environmental history and no significant changes have been reported from her previous visit.  Review of Systems  Constitutional:  Negative for appetite change, chills, fever and unexpected weight change.  HENT:  Negative for congestion, postnasal drip, rhinorrhea and sneezing.   Eyes:  Positive for itching.  Respiratory:  Positive for cough and shortness of  breath. Negative for chest tightness and wheezing.   Cardiovascular:  Negative for chest pain.  Gastrointestinal:  Negative for abdominal pain.  Genitourinary:  Negative for difficulty urinating.  Skin:  Negative for rash.  Allergic/Immunologic: Positive for environmental allergies. Negative for food allergies.  Neurological:  Negative for headaches.    Objective: There were no vitals taken for this visit. There is no height or weight on file to calculate BMI. Physical Exam Vitals and nursing note reviewed.  Constitutional:      Appearance: Normal appearance. She is well-developed and normal weight.  HENT:     Head: Normocephalic and atraumatic.     Right Ear: Tympanic membrane and external ear normal.     Left Ear: Tympanic membrane and external ear normal.     Nose: Nose normal.     Mouth/Throat:     Mouth: Mucous membranes are moist.     Pharynx: Oropharynx is clear.  Eyes:     Conjunctiva/sclera: Conjunctivae normal.  Cardiovascular:     Rate and Rhythm: Normal rate and regular rhythm.     Heart sounds: Normal heart sounds. No murmur heard.    No friction rub. No gallop.  Pulmonary:     Effort: Pulmonary effort is normal.     Breath sounds: Normal breath sounds. No wheezing, rhonchi or  rales.  Abdominal:     Palpations: Abdomen is soft.  Musculoskeletal:     Cervical back: Neck supple.  Skin:    General: Skin is warm.     Findings: No rash.  Neurological:     Mental Status: She is alert and oriented to person, place, and time.  Psychiatric:        Mood and Affect: Mood normal.        Behavior: Behavior normal.    Previous notes and tests were reviewed. The plan was reviewed with the patient/family, and all questions/concerned were addressed.  It was my pleasure to see Suzanne Jarvis today and participate in her care. Please feel free to contact me with any questions or concerns.  Sincerely,  Wyline Mood, DO Allergy & Immunology  [facility] office: [phone] [facility] office: [phone]

## 2023-09-27 ENCOUNTER — Other Ambulatory Visit: Payer: Self-pay

## 2023-09-27 ENCOUNTER — Encounter: Payer: Self-pay | Admitting: Allergy

## 2023-09-27 ENCOUNTER — Ambulatory Visit: Payer: 59 | Admitting: Allergy

## 2023-09-27 VITALS — BP 126/70 | HR 85 | Temp 98.3°F | Resp 12 | Wt 140.4 lb

## 2023-09-27 DIAGNOSIS — Z8709 Personal history of other diseases of the respiratory system: Secondary | ICD-10-CM | POA: Diagnosis not present

## 2023-09-27 DIAGNOSIS — H1010 Acute atopic conjunctivitis, unspecified eye: Secondary | ICD-10-CM | POA: Diagnosis not present

## 2023-09-27 DIAGNOSIS — Z889 Allergy status to unspecified drugs, medicaments and biological substances status: Secondary | ICD-10-CM | POA: Diagnosis not present

## 2023-09-27 DIAGNOSIS — K219 Gastro-esophageal reflux disease without esophagitis: Secondary | ICD-10-CM

## 2023-09-27 DIAGNOSIS — J3089 Other allergic rhinitis: Secondary | ICD-10-CM

## 2023-09-27 DIAGNOSIS — J4520 Mild intermittent asthma, uncomplicated: Secondary | ICD-10-CM | POA: Diagnosis not present

## 2023-09-27 DIAGNOSIS — J302 Other seasonal allergic rhinitis: Secondary | ICD-10-CM

## 2023-09-27 MED ORDER — LEVOCETIRIZINE DIHYDROCHLORIDE 5 MG PO TABS: 5.0000 mg | ORAL_TABLET | Freq: Every evening | ORAL | 3 refills | Status: DC

## 2023-09-27 MED ORDER — MONTELUKAST SODIUM 10 MG PO TABS: 10.0000 mg | ORAL_TABLET | Freq: Every day | ORAL | 3 refills | Status: DC

## 2023-09-27 MED ORDER — ALBUTEROL SULFATE HFA 108 (90 BASE) MCG/ACT IN AERS: 2.0000 | INHALATION_SPRAY | RESPIRATORY_TRACT | 1 refills | Status: DC | PRN

## 2023-09-27 NOTE — Patient Instructions (Addendum)
Environmental allergies 2021 skin testing positive to grass, weed, ragweed, trees, cat, mold, dog, dust mites. Borderline to cockroach.  Continue nvironmental control measures. Take Xyzal and Allegra daily. Continue Singulair (montelukast) 10mg  daily at night. May use eye drops as needed.  Nasal saline spray (i.e., Simply Saline) or nasal saline lavage (i.e., NeilMed) is recommended as needed and prior to medicated nasal sprays.  Breathing/coughing Continue Singulair (montelukast) 10mg  daily at night. May use albuterol rescue inhaler 2 puffs every 4 to 6 hours as needed for shortness of breath, chest tightness, coughing, and wheezing.  Monitor frequency of use - if you need to use it more than twice per week on a consistent basis let us know.   Infections Keep track of infections/antibiotics.  Aspirin Continue to avoid for now.  Penicillin allergy Consider penicillin skin testing/drug challenge in the future. More than 90% of patients outgrow their penicillin allergy.   Follow up in 6 months or sooner if needed.

## 2023-10-10 ENCOUNTER — Other Ambulatory Visit: Payer: Self-pay | Admitting: Family Medicine

## 2023-10-25 ENCOUNTER — Ambulatory Visit (INDEPENDENT_AMBULATORY_CARE_PROVIDER_SITE_OTHER): Payer: 59 | Admitting: Family Medicine

## 2023-10-25 ENCOUNTER — Encounter: Payer: Self-pay | Admitting: Family Medicine

## 2023-10-25 VITALS — BP 118/86 | HR 81 | Temp 98.5°F | Ht 65.0 in | Wt 145.0 lb

## 2023-10-25 DIAGNOSIS — K580 Irritable bowel syndrome with diarrhea: Secondary | ICD-10-CM

## 2023-10-25 DIAGNOSIS — I10 Essential (primary) hypertension: Secondary | ICD-10-CM | POA: Diagnosis not present

## 2023-10-25 DIAGNOSIS — Z131 Encounter for screening for diabetes mellitus: Secondary | ICD-10-CM

## 2023-10-25 DIAGNOSIS — Z0000 Encounter for general adult medical examination without abnormal findings: Secondary | ICD-10-CM

## 2023-10-25 LAB — CBC
HCT: 41.6 % (ref 36.0–46.0)
Hemoglobin: 14.3 g/dL (ref 12.0–15.0)
MCHC: 34.3 g/dL (ref 30.0–36.0)
MCV: 95.2 fL (ref 78.0–100.0)
Platelets: 195 10*3/uL (ref 150.0–400.0)
RBC: 4.37 Mil/uL (ref 3.87–5.11)
RDW: 12.5 % (ref 11.5–15.5)
WBC: 6.5 10*3/uL (ref 4.0–10.5)

## 2023-10-25 LAB — COMPREHENSIVE METABOLIC PANEL
ALT: 14 U/L (ref 0–35)
AST: 19 U/L (ref 0–37)
Albumin: 4.2 g/dL (ref 3.5–5.2)
Alkaline Phosphatase: 45 U/L (ref 39–117)
BUN: 9 mg/dL (ref 6–23)
CO2: 27 meq/L (ref 19–32)
Calcium: 9.4 mg/dL (ref 8.4–10.5)
Chloride: 104 meq/L (ref 96–112)
Creatinine, Ser: 0.81 mg/dL (ref 0.40–1.20)
GFR: 95.91 mL/min (ref >60.00)
Glucose, Bld: 93 mg/dL (ref 70–99)
Potassium: 4 meq/L (ref 3.5–5.1)
Sodium: 139 meq/L (ref 135–145)
Total Bilirubin: 0.6 mg/dL (ref 0.2–1.2)
Total Protein: 6.8 g/dL (ref 6.0–8.3)

## 2023-10-25 LAB — LIPID PANEL
Cholesterol: 160 mg/dL (ref 0–200)
HDL: 76.7 mg/dL (ref >39.00)
LDL Cholesterol: 68 mg/dL (ref 0–99)
NonHDL: 83.45
Total CHOL/HDL Ratio: 2
Triglycerides: 76 mg/dL (ref 0.0–149.0)
VLDL: 15.2 mg/dL (ref 0.0–40.0)

## 2023-10-25 LAB — TSH: TSH: 2.83 u[IU]/mL (ref 0.35–5.50)

## 2023-10-25 LAB — HEMOGLOBIN A1C: Hgb A1c MFr Bld: 4.6 % (ref 4.6–6.5)

## 2023-10-25 MED ORDER — AMLODIPINE BESYLATE 2.5 MG PO TABS: 2.5000 mg | ORAL_TABLET | Freq: Every day | ORAL | 1 refills | Status: DC

## 2023-10-25 MED ORDER — DICYCLOMINE HCL 10 MG PO CAPS: 10.0000 mg | ORAL_CAPSULE | Freq: Four times a day (QID) | ORAL | 11 refills | Status: DC | PRN

## 2023-10-25 NOTE — Patient Instructions (Addendum)

## 2023-10-25 NOTE — Progress Notes (Signed)
Suzanne Jarvis , Nov 03, 1991, 32 y.o., female MRN: [id number] Patient Care Team    Relationship Specialty Notifications Start End  Natalia Leatherwood, DO PCP - General Family Medicine  09/04/15   Rodell Perna, NP (Inactive) Nurse Practitioner Obstetrics and Gynecology  01/29/20   Louann Sjogren, DPM Consulting Physician Podiatry  01/03/22     Chief Complaint  Patient presents with   Annual Exam    Chronic Conditions/illness Management Pt is not fasting     Subjective:Suzanne Jarvis is a 32 y.o.  pt present for an OV for annual physical and combined chronic condition appointment all past medical history was reviewed and updated today.  Health maintenance:  Colon cancer screen: routine screen at 45 (had a colonoscopy for other reasons 01/2019) Mammogram: routine screen 40 Cervical cancer screening: last pap: 12/2021. [facility] [facility], [facility], [facility]-5-year Immunizations: tdap utd 2021, Influenza- declined (encouraged yearly).  Infectious disease screening: HIV completed w/ gyn. Hep c completed DEXA: routine screen Assistive device: none Oxygen QMV:HQIO Patient has a Dental home. Hospitalizations/ED visits: Reviewed  Hypertension: Pt reports compliance with amlodipine . Patient denies chest pain, shortness of breath or lower extremity edema. Rf: lupus  Irritable bowel syndrome with diarrhea Patient reports her irritable bowel syndrome is well controlled on Lomotil and Bentyl as needed.  She only takes this medication when she is going to be away from home and consuming meals.   She rarely requires lomotil use, but does need refills.     05/17/2023    2:13 PM 02/22/2023    8:33 AM 10/19/2022    9:34 AM 09/29/2021    9:23 AM 10/14/2020   10:36 AM  Depression screen PHQ 2/9  Decreased Interest 0 0 0 0 0  Down, Depressed, Hopeless 0 0 0 0 0  PHQ - 2 Score 0 0 0 0 0  Altered sleeping 2 1     Tired, decreased energy 2 2     Change in appetite 0 0     Feeling bad or failure about  yourself  0 0     Trouble concentrating 1 1     Moving slowly or fidgety/restless 0 0     Suicidal thoughts 0 0     PHQ-9 Score 5 4     Difficult doing work/chores Not difficult at all Not difficult at all       Allergies  Allergen Reactions   Aspirin Anaphylaxis   Adrenal Cortex Extract Hives   Amoxicillin Hives   Hyoscyamine Other (See Comments)   Mixed Grasses    Novocain [Procaine] Swelling   Other      grass, weed, ragweed, trees, cat, mold, dog, dust mites. Borderline to cockroach.     Paxil [Paroxetine Hcl]     Dizziness    Penicillins     REACTION: hives   Social History   Social History Narrative   Right Handed    Lives in a two story condo on the second floor    Goes to the gyn 3 times a week.      Has a Allena Earing that she called her son    Past Medical History:  Diagnosis Date   ADD (attention deficit disorder) 07/08/2020   Angio-edema    ASCUS with positive high risk HPV cervical 03/26/2019   01/2019; negative colposcopy 03/2019; plan repeat pap and HPV test 01/2020   Asthma    Bilateral hand numbness 04/14/2021   Chronic headaches  Concussion with no loss of consciousness 07/08/2020   May 2021   Eczema    Gastroesophageal reflux disease 03/22/2023   History of brain concussion 07/08/2020   May 2021     IBS (irritable bowel syndrome)    Metatarsalgia of right foot 07/07/2021   Penicillin allergy 04/28/2020   Seasonal allergies 01/14/2021   Seasonal and perennial allergic rhinoconjunctivitis 07/28/2020   Tonsillitis    Urticaria    Past Surgical History:  Procedure Laterality Date   WISDOM TOOTH EXTRACTION     Family History  Problem Relation Age of Onset   Allergies Mother    Asthma Mother    Hypertension Mother    Allergic rhinitis Mother    Urticaria Mother    Healthy Father    Allergic rhinitis Sister    Eczema Sister    Anemia Sister    Hashimoto's thyroiditis Brother    ADD / ADHD Brother    Arthritis Maternal Grandmother     Irritable bowel syndrome Maternal Grandmother    Breast cancer Maternal Grandmother    Lupus Cousin    Hashimoto's thyroiditis Cousin    Lung cancer Other        paternal great grandfather   Lung cancer Other        maternal great grandfather   Pancreatic cancer Other        great aunt   Colon cancer Neg Hx    Allergies as of 10/25/2023       Reactions   Aspirin Anaphylaxis   Adrenal Cortex Extract Hives   Amoxicillin Hives   Hyoscyamine Other (See Comments)   Mixed Grasses    Novocain [procaine] Swelling   Other     grass, weed, ragweed, trees, cat, mold, dog, dust mites. Borderline to cockroach.     Paxil [paroxetine Hcl]    Dizziness   Penicillins    REACTION: hives        Medication List        Accurate as of October 25, 2023  9:15 AM. If you have any questions, ask your nurse or doctor.          albuterol 108 (90 Base) MCG/ACT inhaler Commonly known as: Ventolin HFA Inhale 2 puffs into the lungs every 4 (four) hours as needed for wheezing or shortness of breath (coughing fits).   amLODipine 2.5 MG tablet Commonly known as: NORVASC Take 1 tablet (2.5 mg total) by mouth daily.   ascorbic acid 500 MG tablet Commonly known as: VITAMIN C Take by mouth.   B-12 1000 MCG Tabs Take 2 tablets by mouth daily.   colchicine 0.6 MG tablet Take 0.6 mg by mouth as needed.   dicyclomine 10 MG capsule Commonly known as: BENTYL Take 1 capsule (10 mg total) by mouth 4 (four) times daily as needed for spasms. What changed: additional instructions Changed by: Felix Pacini   diphenoxylate-atropine 2.5-0.025 MG tablet Commonly known as: LOMOTIL TAKE 1 TABLET BY MOUTH FOUR TIMES DAILY AS NEEDED FOR DIARRHEA OR LOOSE STOOLS   drospirenone-ethinyl estradiol 3-0.02 MG tablet Commonly known as: YAZ Take 1 tablet by mouth daily.   FORTIFY PROBIOTIC WOMENS PO Take by mouth.   levocetirizine 5 MG tablet Commonly known as: XYZAL Take 1 tablet (5 mg total) by  mouth every evening.   Magnesium 100 MG Tabs Take by mouth. W/ calcium and zinc   montelukast 10 MG tablet Commonly known as: SINGULAIR Take 1 tablet (10 mg total) by mouth at bedtime.  multivitamin with minerals tablet Take 1 tablet by mouth daily.   Vitamin D (Cholecalciferol) 25 MCG (1000 UT) Caps Take by mouth.        All past medical history, surgical history, allergies, family history, immunizations andmedications were updated in the EMR today and reviewed under the history and medication portions of their EMR.     ROS: Negative, with the exception of above mentioned in HPI   Objective:  BP 118/86   Pulse 81   Temp 98.5 F (36.9 C)   Ht 5\' 5"  (1.651 m)   Wt 145 lb (65.8 kg)   LMP 09/28/2023 (Approximate)   SpO2 97%   BMI 24.13 kg/m  Body mass index is 24.13 kg/m. Physical Exam Vitals and nursing note reviewed.  Constitutional:      General: She is not in acute distress.    Appearance: Normal appearance. She is not ill-appearing or toxic-appearing.  HENT:     Head: Normocephalic and atraumatic.     Right Ear: Tympanic membrane, ear canal and external ear normal. There is no impacted cerumen.     Left Ear: Tympanic membrane, ear canal and external ear normal. There is no impacted cerumen.     Nose: No congestion or rhinorrhea.     Mouth/Throat:     Mouth: Mucous membranes are moist.     Pharynx: Oropharynx is clear. No oropharyngeal exudate or posterior oropharyngeal erythema.  Eyes:     General: No scleral icterus.       Right eye: No discharge.        Left eye: No discharge.     Extraocular Movements: Extraocular movements intact.     Conjunctiva/sclera: Conjunctivae normal.     Pupils: Pupils are equal, round, and reactive to light.  Cardiovascular:     Rate and Rhythm: Normal rate and regular rhythm.     Pulses: Normal pulses.     Heart sounds: Normal heart sounds. No murmur heard.    No friction rub. No gallop.  Pulmonary:     Effort: Pulmonary  effort is normal. No respiratory distress.     Breath sounds: Normal breath sounds. No stridor. No wheezing, rhonchi or rales.  Chest:     Chest wall: No tenderness.  Abdominal:     General: Abdomen is flat. Bowel sounds are normal. There is no distension.     Palpations: Abdomen is soft. There is no mass.     Tenderness: There is no abdominal tenderness. There is no right CVA tenderness, left CVA tenderness, guarding or rebound.     Hernia: No hernia is present.  Musculoskeletal:        General: No swelling, tenderness or deformity. Normal range of motion.     Cervical back: Normal range of motion and neck supple. No rigidity or tenderness.     Right lower leg: No edema.     Left lower leg: No edema.  Lymphadenopathy:     Cervical: No cervical adenopathy.  Skin:    General: Skin is warm and dry.     Coloration: Skin is not jaundiced or pale.     Findings: No bruising, erythema, lesion or rash.  Neurological:     General: No focal deficit present.     Mental Status: She is alert and oriented to person, place, and time. Mental status is at baseline.     Cranial Nerves: No cranial nerve deficit.     Sensory: No sensory deficit.     Motor: No weakness.  Coordination: Coordination normal.     Gait: Gait normal.     Deep Tendon Reflexes: Reflexes normal.  Psychiatric:        Mood and Affect: Mood normal.        Behavior: Behavior normal.        Thought Content: Thought content normal.        Judgment: Judgment normal.     No results found. No results found. No results found for this or any previous visit (from the past 24 hour(s)).  Assessment/Plan: Suzanne Jarvis is a 32 y.o. female present for OV for cpe and Chronic Conditions/illness Management Irritable bowel syndrome with diarrhea Stable Continue  Lomotil as needed> does not need refills today Continue Bentyl as needed  Benign hypertension/raynauds Stable Continue amlodipine 2.5 mg every day.  - Hemoglobin  A1c - Lipid panel  Diabetes mellitus screening - Hemoglobin A1c  Routine general medical examination at a health care facility Patient was encouraged to exercise greater than 150 minutes a week. Patient was encouraged to choose a diet filled with fresh fruits and vegetables, and lean meats. AVS provided to patient today for education/recommendation on gender specific health and safety maintenance. Colon cancer: routine screen at 45 (had a colonoscopy for other reasons 01/2019) Mammogram: routine screen 40 Cervical cancer screening: last pap: 12/2021. [facility] [facility], [facility], [facility]-5-year Immunizations: tdap utd 2021, Influenza- declined (encouraged yearly).  Infectious disease screening: HIV completed w/ gyn. Hep c completed DEXA: routine screen   Reviewed expectations re: course of current medical issues. Discussed self-management of symptoms. Outlined signs and symptoms indicating need for more acute intervention. Patient verbalized understanding and all questions were answered. Patient received an After-Visit Summary.   Orders Placed This Encounter  Procedures   CBC   Comprehensive metabolic panel   Hemoglobin A1c   TSH   Lipid panel    Meds ordered this encounter  Medications   amLODipine (NORVASC) 2.5 MG tablet    Sig: Take 1 tablet (2.5 mg total) by mouth daily.    Dispense:  90 tablet    Refill:  1   dicyclomine (BENTYL) 10 MG capsule    Sig: Take 1 capsule (10 mg total) by mouth 4 (four) times daily as needed for spasms.    Dispense:  120 capsule    Refill:  11    Prn med- please only fill if pt request    Referral Orders  No referral(s) requested today    Note is dictated utilizing voice recognition software. Although note has been proof read prior to signing, occasional typographical errors still can be missed. If any questions arise, please do not hesitate to call for verification.   electronically signed by:  Felix Pacini, DO  [facility] -  OR

## 2024-01-16 DIAGNOSIS — Z1389 Encounter for screening for other disorder: Secondary | ICD-10-CM | POA: Diagnosis not present

## 2024-01-16 DIAGNOSIS — N9089 Other specified noninflammatory disorders of vulva and perineum: Secondary | ICD-10-CM | POA: Diagnosis not present

## 2024-01-16 DIAGNOSIS — Z01419 Encounter for gynecological examination (general) (routine) without abnormal findings: Secondary | ICD-10-CM | POA: Diagnosis not present

## 2024-01-16 DIAGNOSIS — Z130 Encounter for screening for diseases of the blood and blood-forming organs and certain disorders involving the immune mechanism: Secondary | ICD-10-CM | POA: Diagnosis not present

## 2024-01-16 DIAGNOSIS — Z3041 Encounter for surveillance of contraceptive pills: Secondary | ICD-10-CM | POA: Diagnosis not present

## 2024-03-27 ENCOUNTER — Other Ambulatory Visit: Payer: Self-pay

## 2024-03-27 ENCOUNTER — Ambulatory Visit: Payer: 59 | Admitting: Allergy

## 2024-03-27 ENCOUNTER — Telehealth: Payer: Self-pay | Admitting: Allergy

## 2024-03-27 ENCOUNTER — Encounter: Payer: Self-pay | Admitting: Allergy

## 2024-03-27 VITALS — BP 124/82 | HR 87 | Temp 98.5°F | Resp 18 | Ht 65.0 in | Wt 142.3 lb

## 2024-03-27 DIAGNOSIS — H6121 Impacted cerumen, right ear: Secondary | ICD-10-CM

## 2024-03-27 DIAGNOSIS — Z8709 Personal history of other diseases of the respiratory system: Secondary | ICD-10-CM

## 2024-03-27 DIAGNOSIS — J4520 Mild intermittent asthma, uncomplicated: Secondary | ICD-10-CM

## 2024-03-27 DIAGNOSIS — J3089 Other allergic rhinitis: Secondary | ICD-10-CM

## 2024-03-27 DIAGNOSIS — Z888 Allergy status to other drugs, medicaments and biological substances status: Secondary | ICD-10-CM | POA: Diagnosis not present

## 2024-03-27 DIAGNOSIS — H1013 Acute atopic conjunctivitis, bilateral: Secondary | ICD-10-CM

## 2024-03-27 DIAGNOSIS — Z889 Allergy status to unspecified drugs, medicaments and biological substances status: Secondary | ICD-10-CM

## 2024-03-27 DIAGNOSIS — J328 Other chronic sinusitis: Secondary | ICD-10-CM

## 2024-03-27 DIAGNOSIS — K219 Gastro-esophageal reflux disease without esophagitis: Secondary | ICD-10-CM

## 2024-03-27 DIAGNOSIS — J302 Other seasonal allergic rhinitis: Secondary | ICD-10-CM | POA: Diagnosis not present

## 2024-03-27 DIAGNOSIS — H1010 Acute atopic conjunctivitis, unspecified eye: Secondary | ICD-10-CM

## 2024-03-27 MED ORDER — OLOPATADINE HCL 0.2 % OP SOLN: 1.0000 [drp] | Freq: Every day | OPHTHALMIC | 5 refills | Status: AC | PRN

## 2024-03-27 MED ORDER — BUDESONIDE 0.5 MG/2ML IN SUSP: RESPIRATORY_TRACT | 2 refills | Status: DC

## 2024-03-27 NOTE — Telephone Encounter (Signed)
 Please place referral to ENT for chronic sinusitis and right ear cerumen. Thank you.

## 2024-03-27 NOTE — Patient Instructions (Addendum)
 Environmental allergies 2021 skin testing positive to grass, weed, ragweed, trees, cat, mold, dog, dust mites. Borderline to cockroach.  Continue nvironmental control measures. Take Xyzal  and Allegra daily. Continue Singulair  (montelukast ) 10mg  daily at night. Use olopatadine  eye drops 0.2% once a day as needed for itchy/watery eyes. Nasal saline spray (i.e., Simply Saline) or nasal saline lavage (i.e., NeilMed) is recommended as needed and prior to medicated nasal sprays. Start budesonide saline wash twice a day as below.  If it causes nosebleeds stop and let us  know.  Refer to ENT for recurrent sinusitis and right ear earwax.   Breathing/coughing Continue Singulair  (montelukast ) 10mg  daily at night. May use albuterol  rescue inhaler 2 puffs every 4 to 6 hours as needed for shortness of breath, chest tightness, coughing, and wheezing.  Monitor frequency of use - if you need to use it more than twice per week on a consistent basis let us  know.   Infections Keep track of infections/antibiotics.  Aspirin Continue to avoid.   Penicillin allergy Consider penicillin skin testing/drug challenge in the future. More than 90% of patients outgrow their penicillin allergy.   Follow up in 6 months or sooner if needed.

## 2024-03-27 NOTE — Progress Notes (Unsigned)
 Follow Up Note  RE: Suzanne Jarvis MRN: [id number] DOB: 03-30-1991 Date of Office Visit: 03/27/2024  Referring provider: Mariel Shope, DO Primary care provider: Mariel Shope, DO  Chief Complaint: Allergic Rhinitis  (Constant nose blowing, fluid in her ears, a lot of mucus - clear/ green )  History of Present Illness: I had the pleasure of seeing Suzanne Jarvis for a follow up visit at the [facility] on 03/28/2024. She is a 33 y.o. female, who is being followed for reactive airway disease, allergic rhinoconjunctivitis, frequent URIs and multiple drug allergies. Her previous allergy office visit was on 09/27/2023 with Dr. Burdette Carolin. Today is a regular follow up visit.  Discussed the use of AI scribe software for clinical note transcription with the patient, who gave verbal consent to proceed.    She experiences persistent allergy symptoms, including significant mucus production both anteriorly and posteriorly in the throat, and suspects fluid accumulation in her ear. Her current medication regimen includes Allegra in the morning, Xyzal   and singulair  at night, and over-the-counter allergy eye drops. She previously used Claritin but found it ineffective. She avoids nasal sprays due to epistaxis, specifically noting that Flonase caused this issue. She uses a neti pot for sinus rinsing.  She previously underwent allergy shots but discontinued them due to large localized reactions, which she believes exacerbated her lupus symptoms, causing increased body pain and joint issues.  Her lupus is currently under control, and she is not on any medications for lupus except for colchicine. She avoids aspirin and has a penicillin allergy. She reports that her lupus numbers are low.  She occasionally uses an inhaler, approximately once a month, typically after gym workouts. She has not required emergency care or steroids for asthma-related issues recently.  She has experienced sinus  infections requiring antibiotics once or twice since her last visit. She previously saw an ENT specialist but was dissatisfied with the care received. She is open to seeing a different ENT specialist for further evaluation of her sinus issues.  She reports itchy ears particularly in the right ear.     Assessment and Plan: Suzanne Jarvis is a 33 y.o. female with: Reactive airway disease, mild intermittent, uncomplicated Mainly exertion induced. Continue Singulair  (montelukast ) 10mg  daily at night. May use albuterol  rescue inhaler 2 puffs every 4 to 6 hours as needed for shortness of breath, chest tightness, coughing, and wheezing.  Monitor frequency of use - if you need to use it more than twice per week on a consistent basis let us  know.    Seasonal and perennial allergic rhinoconjunctivitis Past history - Rhino conjunctivitis symptoms from the spring through fall for the past 5 years. 2 sinus infections per year. 2021 skin testing showed: Positive to grass, weed, ragweed, trees, cat, mold, dog, dust mites. Borderline to cockroach.  Negative to common foods. Started AIT on 05/20/2020 (G-W-RW-T and M-C-D-CR-DM) and stopped in 2023 due to pain. Epistaxis with nasal sprays.  Interim history -  still symptomatic but does not want to use nasal sprays or restart AIT. Continue nvironmental control measures. Take Xyzal  and Allegra daily. Continue Singulair  (montelukast ) 10mg  daily at night. Use olopatadine  eye drops 0.2% once a day as needed for itchy/watery eyes. Nasal saline spray (i.e., Simply Saline) or nasal saline lavage (i.e., NeilMed) is recommended as needed and prior to medicated nasal sprays. Start budesonide saline wash twice a day as below.  If it causes nosebleeds stop and let us  know.  Refer to  ENT for recurrent sinusitis and right ear earwax.    History of frequent upper respiratory infection Past history - Notes frequent sinus infections needing frequent antibiotics. 2022 bloodwork - normal  immunoglobulin levels, protective titers for tetanus/diptheria, pneumococcal titers were adequate but not robust. Keep track of infections/antibiotics use.   Multiple drug allergies Past history - Broke out in hives after taking penicillin. Adverse reaction to aspirin - most likely non-IgE mediated. Tolerates other NSAIDs.  Consider penicillin skin testing/drug challenge in the future. More than 90% of patients outgrow their penicillin allergy.  Avoid aspirin.  Return in about 6 months (around 09/27/2024).  Meds ordered this encounter  Medications   Olopatadine  HCl 0.2 % SOLN    Sig: Apply 1 drop to eye daily as needed (itchy/watery eyes).    Dispense:  2.5 mL    Refill:  5   budesonide (PULMICORT) 0.5 MG/2ML nebulizer solution    Sig: Take 1 vial and mix into saline rinses twice a day.    Dispense:  120 mL    Refill:  2   Lab Orders  No laboratory test(s) ordered today    Diagnostics: None.   Medication List:  Current Outpatient Medications  Medication Sig Dispense Refill   albuterol  (VENTOLIN  HFA) 108 (90 Base) MCG/ACT inhaler Inhale 2 puffs into the lungs every 4 (four) hours as needed for wheezing or shortness of breath (coughing fits). 18 g 1   amLODipine  (NORVASC ) 2.5 MG tablet Take 1 tablet (2.5 mg total) by mouth daily. 90 tablet 1   budesonide (PULMICORT) 0.5 MG/2ML nebulizer solution Take 1 vial and mix into saline rinses twice a day. 120 mL 2   colchicine 0.6 MG tablet Take 0.6 mg by mouth as needed.     Cyanocobalamin (B-12) 1000 MCG TABS Take 2 tablets by mouth daily.     dicyclomine  (BENTYL ) 10 MG capsule Take 1 capsule (10 mg total) by mouth 4 (four) times daily as needed for spasms. 120 capsule 11   diphenoxylate -atropine  (LOMOTIL ) 2.5-0.025 MG tablet TAKE 1 TABLET BY MOUTH FOUR TIMES DAILY AS NEEDED FOR DIARRHEA OR LOOSE STOOLS 180 tablet 1   Suzanne Jarvis 24 FE 1-20 MG-MCG(24) tablet Take 1 tablet by mouth daily.     levocetirizine (XYZAL ) 5 MG tablet Take 1 tablet  (5 mg total) by mouth every evening. 90 tablet 3   Magnesium 100 MG TABS Take by mouth. W/ calcium and zinc     montelukast  (SINGULAIR ) 10 MG tablet Take 1 tablet (10 mg total) by mouth at bedtime. 90 tablet 3   Multiple Vitamins-Minerals (MULTIVITAMIN WITH MINERALS) tablet Take 1 tablet by mouth daily.     Olopatadine  HCl 0.2 % SOLN Apply 1 drop to eye daily as needed (itchy/watery eyes). 2.5 mL 5   Probiotic Product (FORTIFY PROBIOTIC WOMENS PO) Take by mouth.     Vitamin D , Cholecalciferol, 25 MCG (1000 UT) CAPS Take by mouth.     No current facility-administered medications for this visit.   Allergies: Allergies  Allergen Reactions   Aspirin Anaphylaxis   Adrenal Cortex Extract Hives   Amoxicillin Hives   Hyoscyamine  Other (See Comments)   Mixed Grasses    Novocain [Procaine] Swelling   Other      grass, weed, ragweed, trees, cat, mold, dog, dust mites. Borderline to cockroach.     Paxil  [Paroxetine  Hcl]     Dizziness    Penicillins     REACTION: hives   I reviewed her past medical history, social history,  family history, and environmental history and no significant changes have been reported from her previous visit.  Review of Systems  Constitutional:  Negative for appetite change, chills, fever and unexpected weight change.  HENT:  Positive for congestion. Negative for postnasal drip, rhinorrhea and sneezing.   Respiratory:  Negative for chest tightness and wheezing.   Cardiovascular:  Negative for chest pain.  Gastrointestinal:  Negative for abdominal pain.  Genitourinary:  Negative for difficulty urinating.  Skin:  Negative for rash.  Allergic/Immunologic: Positive for environmental allergies. Negative for food allergies.  Neurological:  Negative for headaches.    Objective: BP 124/82 (BP Location: Right Arm, Patient Position: Sitting, Cuff Size: Normal)   Pulse 87   Temp 98.5 F (36.9 C) (Temporal)   Resp 18   Ht 5\' 5"  (1.651 m)   Wt 142 lb 4.8 oz (64.5 kg)    SpO2 96%   BMI 23.68 kg/m  Body mass index is 23.68 kg/m. Physical Exam Vitals and nursing note reviewed.  Constitutional:      Appearance: Normal appearance. She is well-developed and normal weight.  HENT:     Head: Normocephalic and atraumatic.     Right Ear: Tympanic membrane and external ear normal.     Left Ear: Tympanic membrane and external ear normal.     Ears:     Comments: Right ear cerumen.    Nose: Nose normal.     Mouth/Throat:     Mouth: Mucous membranes are moist.     Pharynx: Oropharynx is clear.  Eyes:     Conjunctiva/sclera: Conjunctivae normal.  Cardiovascular:     Rate and Rhythm: Normal rate and regular rhythm.     Heart sounds: Normal heart sounds. No murmur heard.    No friction rub. No gallop.  Pulmonary:     Effort: Pulmonary effort is normal.     Breath sounds: Normal breath sounds. No wheezing, rhonchi or rales.  Abdominal:     Palpations: Abdomen is soft.  Musculoskeletal:     Cervical back: Neck supple.  Skin:    General: Skin is warm.     Findings: No rash.  Neurological:     Mental Status: She is alert and oriented to person, place, and time.  Psychiatric:        Mood and Affect: Mood normal.        Behavior: Behavior normal.   Previous notes and tests were reviewed. The plan was reviewed with the patient/family, and all questions/concerned were addressed.  It was my pleasure to see Aulani today and participate in her care. Please feel free to contact me with any questions or concerns.  Sincerely,  Eudelia Hero, DO Allergy & Immunology  [facility]  [place] office: [phone] [facility] office: [phone]

## 2024-03-28 ENCOUNTER — Encounter: Payer: Self-pay | Admitting: Allergy

## 2024-03-30 ENCOUNTER — Telehealth: Admitting: Emergency Medicine

## 2024-03-30 DIAGNOSIS — K529 Noninfective gastroenteritis and colitis, unspecified: Secondary | ICD-10-CM

## 2024-03-30 MED ORDER — ONDANSETRON HCL 4 MG PO TABS: 4.0000 mg | ORAL_TABLET | Freq: Three times a day (TID) | ORAL | 0 refills | Status: DC | PRN

## 2024-03-30 NOTE — Patient Instructions (Signed)
 Suzanne Jarvis, thank you for joining Blinda Burger, NP for today's virtual visit.  While this provider is not your primary care provider (PCP), if your PCP is located in our provider database this encounter information will be shared with them immediately following your visit.   A [facility] MyChart account gives you access to today's visit and all your visits, tests, and labs performed at [facility] " click here if you don't have a [facility] MyChart account or go to [email or web address]  Consent: (Patient) Suzanne Jarvis provided verbal consent for this virtual visit at the beginning of the encounter.  Current Medications:  Current Outpatient Medications:    ondansetron  (ZOFRAN ) 4 MG tablet, Take 1 tablet (4 mg total) by mouth every 8 (eight) hours as needed for nausea or vomiting., Disp: 20 tablet, Rfl: 0   albuterol  (VENTOLIN  HFA) 108 (90 Base) MCG/ACT inhaler, Inhale 2 puffs into the lungs every 4 (four) hours as needed for wheezing or shortness of breath (coughing fits)., Disp: 18 g, Rfl: 1   amLODipine  (NORVASC ) 2.5 MG tablet, Take 1 tablet (2.5 mg total) by mouth daily., Disp: 90 tablet, Rfl: 1   budesonide  (PULMICORT ) 0.5 MG/2ML nebulizer solution, Take 1 vial and mix into saline rinses twice a day., Disp: 120 mL, Rfl: 2   colchicine 0.6 MG tablet, Take 0.6 mg by mouth as needed., Disp: , Rfl:    Cyanocobalamin (B-12) 1000 MCG TABS, Take 2 tablets by mouth daily., Disp: , Rfl:    dicyclomine  (BENTYL ) 10 MG capsule, Take 1 capsule (10 mg total) by mouth 4 (four) times daily as needed for spasms., Disp: 120 capsule, Rfl: 11   diphenoxylate -atropine  (LOMOTIL ) 2.5-0.025 MG tablet, TAKE 1 TABLET BY MOUTH FOUR TIMES DAILY AS NEEDED FOR DIARRHEA OR LOOSE STOOLS, Disp: 180 tablet, Rfl: 1   HAILEY 24 FE 1-20 MG-MCG(24) tablet, Take 1 tablet by mouth daily., Disp: , Rfl:    levocetirizine (XYZAL ) 5 MG tablet, Take 1 tablet (5 mg total) by mouth every evening., Disp: 90  tablet, Rfl: 3   Magnesium 100 MG TABS, Take by mouth. W/ calcium and zinc, Disp: , Rfl:    montelukast  (SINGULAIR ) 10 MG tablet, Take 1 tablet (10 mg total) by mouth at bedtime., Disp: 90 tablet, Rfl: 3   Multiple Vitamins-Minerals (MULTIVITAMIN WITH MINERALS) tablet, Take 1 tablet by mouth daily., Disp: , Rfl:    Olopatadine  HCl 0.2 % SOLN, Apply 1 drop to eye daily as needed (itchy/watery eyes)., Disp: 2.5 mL, Rfl: 5   Probiotic Product (FORTIFY PROBIOTIC WOMENS PO), Take by mouth., Disp: , Rfl:    Vitamin D , Cholecalciferol, 25 MCG (1000 UT) CAPS, Take by mouth., Disp: , Rfl:    Medications ordered in this encounter:  Meds ordered this encounter  Medications   ondansetron  (ZOFRAN ) 4 MG tablet    Sig: Take 1 tablet (4 mg total) by mouth every 8 (eight) hours as needed for nausea or vomiting.    Dispense:  20 tablet    Refill:  0     *If you need refills on other medications prior to your next appointment, please contact your pharmacy*  Follow-Up: Call back or seek an in-person evaluation if the symptoms worsen or if the condition fails to improve as anticipated.  [facility] [phone]  Other Instructions It is okay to take your diphenoxylate  atropine  prescription per bottle instructions.  I have prescribed nausea medicine, ondansetron , for you to use at home.  Focus  on staying hydrated; if you are having trouble keeping liquids down, stick with sips of liquids instead of large volumes.   If you have been instructed to have an in-person evaluation today at a local Urgent Care facility, please use the link below. It will take you to a list of all of our available [facility], including address, phone number and hours of operation. Please do not delay care.  [facility]  If you or a family member do not have a primary care provider, use the link below to schedule a visit and establish care. When you choose a [facility] primary care physician  or advanced practice provider, you gain a long-term partner in health. Find a Primary Care Provider  Learn more about [facility]'s in-office and virtual care options: [facility] - Get Care Now

## 2024-03-30 NOTE — Progress Notes (Signed)
 Virtual Visit Consent   Suzanne Jarvis, you are scheduled for a virtual visit with a [facility] provider today. Just as with appointments in the office, your consent must be obtained to participate. Your consent will be active for this visit and any virtual visit you may have with one of our providers in the next 365 days. If you have a MyChart account, a copy of this consent can be sent to you electronically.  As this is a virtual visit, video technology does not allow for your provider to perform a traditional examination. This may limit your provider's ability to fully assess your condition. If your provider identifies any concerns that need to be evaluated in person or the need to arrange testing (such as labs, EKG, etc.), we will make arrangements to do so. Although advances in technology are sophisticated, we cannot ensure that it will always work on either your end or our end. If the connection with a video visit is poor, the visit may have to be switched to a telephone visit. With either a video or telephone visit, we are not always able to ensure that we have a secure connection.  By engaging in this virtual visit, you consent to the provision of healthcare and authorize for your insurance to be billed (if applicable) for the services provided during this visit. Depending on your insurance coverage, you may receive a charge related to this service.  I need to obtain your verbal consent now. Are you willing to proceed with your visit today? Suzanne Jarvis has provided verbal consent on 03/30/2024 for a virtual visit (video or telephone). Blinda Burger, NP  Date: 03/30/2024 3:33 PM   Virtual Visit via Video Note   I, Blinda Burger, connected with  Suzanne Jarvis  ([id number], [birth year]) on 03/30/24 at  3:30 PM EDT by a video-enabled telemedicine application and verified that I am speaking with the correct person using two identifiers.  Location: Patient: Virtual Visit Location  Patient: Home Provider: Virtual Visit Location Provider: Home Office   I discussed the limitations of evaluation and management by telemedicine and the availability of in person appointments. The patient expressed understanding and agreed to proceed.    History of Present Illness: Suzanne Jarvis is a 33 y.o. who identifies as a female who was assigned female at birth, and is being seen today for vomiting and diarrhea.  At lunch at noon yesterday, was feeling woozy so ate lunch but did not really feel better. Hungry at dinner, ate 2 bites and had nausea but no vomiting until she woke up at midnight and had vomiting.Vomited this mornign green emesis.  Has headache now she thinks because she can't drink coffee.  Also has diarrhea. Ate soup today, vomited again. Emesis x3 total. Diarrhea 6x. No blood or mucus in diarrhea.  Is sipping ginger ale and keeping it down.  Denies dysuria or severe back pain.  Tried any medicines for the symptoms.  Does have Lomotil  for scription at home because she has IBS but has not taken any yet  HPI: HPI  Problems:  Patient Active Problem List   Diagnosis Date Noted   Reactive airway disease, mild intermittent, uncomplicated 03/22/2023   Benign hypertension 03/22/2023   Multiple drug allergies 09/21/2022   Aphthous ulcer of mouth 09/29/2021   Positive ANA (antinuclear antibody) 02/24/2021   Seasonal and perennial allergic rhinoconjunctivitis 07/28/2020   IBS (irritable bowel syndrome) 09/30/2015   Vitamin B 12 deficiency 12/10/2014  Allergies:  Allergies  Allergen Reactions   Aspirin Anaphylaxis   Adrenal Cortex Extract Hives   Amoxicillin Hives   Hyoscyamine  Other (See Comments)   Mixed Grasses    Novocain [Procaine] Swelling   Other      grass, weed, ragweed, trees, cat, mold, dog, dust mites. Borderline to cockroach.     Paxil  [Paroxetine  Hcl]     Dizziness    Penicillins     REACTION: hives   Medications:  Current Outpatient Medications:     ondansetron  (ZOFRAN ) 4 MG tablet, Take 1 tablet (4 mg total) by mouth every 8 (eight) hours as needed for nausea or vomiting., Disp: 20 tablet, Rfl: 0   albuterol  (VENTOLIN  HFA) 108 (90 Base) MCG/ACT inhaler, Inhale 2 puffs into the lungs every 4 (four) hours as needed for wheezing or shortness of breath (coughing fits)., Disp: 18 g, Rfl: 1   amLODipine  (NORVASC ) 2.5 MG tablet, Take 1 tablet (2.5 mg total) by mouth daily., Disp: 90 tablet, Rfl: 1   budesonide  (PULMICORT ) 0.5 MG/2ML nebulizer solution, Take 1 vial and mix into saline rinses twice a day., Disp: 120 mL, Rfl: 2   colchicine 0.6 MG tablet, Take 0.6 mg by mouth as needed., Disp: , Rfl:    Cyanocobalamin (B-12) 1000 MCG TABS, Take 2 tablets by mouth daily., Disp: , Rfl:    dicyclomine  (BENTYL ) 10 MG capsule, Take 1 capsule (10 mg total) by mouth 4 (four) times daily as needed for spasms., Disp: 120 capsule, Rfl: 11   diphenoxylate -atropine  (LOMOTIL ) 2.5-0.025 MG tablet, TAKE 1 TABLET BY MOUTH FOUR TIMES DAILY AS NEEDED FOR DIARRHEA OR LOOSE STOOLS, Disp: 180 tablet, Rfl: 1   HAILEY 24 FE 1-20 MG-MCG(24) tablet, Take 1 tablet by mouth daily., Disp: , Rfl:    levocetirizine (XYZAL ) 5 MG tablet, Take 1 tablet (5 mg total) by mouth every evening., Disp: 90 tablet, Rfl: 3   Magnesium 100 MG TABS, Take by mouth. W/ calcium and zinc, Disp: , Rfl:    montelukast  (SINGULAIR ) 10 MG tablet, Take 1 tablet (10 mg total) by mouth at bedtime., Disp: 90 tablet, Rfl: 3   Multiple Vitamins-Minerals (MULTIVITAMIN WITH MINERALS) tablet, Take 1 tablet by mouth daily., Disp: , Rfl:    Olopatadine  HCl 0.2 % SOLN, Apply 1 drop to eye daily as needed (itchy/watery eyes)., Disp: 2.5 mL, Rfl: 5   Probiotic Product (FORTIFY PROBIOTIC WOMENS PO), Take by mouth., Disp: , Rfl:    Vitamin D , Cholecalciferol, 25 MCG (1000 UT) CAPS, Take by mouth., Disp: , Rfl:   Observations/Objective: Patient is well-developed, well-nourished in no acute distress.  Resting comfortably   at home.  Head is normocephalic, atraumatic.  No labored breathing.  Speech is clear and coherent with logical content.  Patient is alert and oriented at baseline.    Assessment and Plan: 1. Gastroenteritis (Primary)  Symptoms sound like acute gastroenteritis.  Will treat symptoms.  Discussed typical course of illness.  Follow Up Instructions: I discussed the assessment and treatment plan with the patient. The patient was provided an opportunity to ask questions and all were answered. The patient agreed with the plan and demonstrated an understanding of the instructions.  A copy of instructions were sent to the patient via MyChart unless otherwise noted below.   The patient was advised to call back or seek an in-person evaluation if the symptoms worsen or if the condition fails to improve as anticipated.    Blinda Burger, NP

## 2024-04-10 ENCOUNTER — Encounter: Payer: Self-pay | Admitting: Family Medicine

## 2024-04-10 ENCOUNTER — Ambulatory Visit (INDEPENDENT_AMBULATORY_CARE_PROVIDER_SITE_OTHER): Payer: 59 | Admitting: Family Medicine

## 2024-04-10 ENCOUNTER — Ambulatory Visit: Admitting: Family Medicine

## 2024-04-10 VITALS — BP 118/78 | HR 71 | Temp 99.1°F | Wt 140.4 lb

## 2024-04-10 DIAGNOSIS — H938X9 Other specified disorders of ear, unspecified ear: Secondary | ICD-10-CM | POA: Diagnosis not present

## 2024-04-10 DIAGNOSIS — K580 Irritable bowel syndrome with diarrhea: Secondary | ICD-10-CM | POA: Diagnosis not present

## 2024-04-10 DIAGNOSIS — I10 Essential (primary) hypertension: Secondary | ICD-10-CM | POA: Diagnosis not present

## 2024-04-10 DIAGNOSIS — H6121 Impacted cerumen, right ear: Secondary | ICD-10-CM | POA: Insufficient documentation

## 2024-04-10 MED ORDER — DIPHENOXYLATE-ATROPINE 2.5-0.025 MG PO TABS: ORAL_TABLET | ORAL | 1 refills | Status: DC

## 2024-04-10 MED ORDER — AMLODIPINE BESYLATE 2.5 MG PO TABS: 2.5000 mg | ORAL_TABLET | Freq: Every day | ORAL | 2 refills | Status: DC

## 2024-04-10 NOTE — Progress Notes (Signed)
 Suzanne Jarvis , [birth year], 33 y.o., female MRN: [id number] Patient Care Team    Relationship Specialty Notifications Start End  Mariel Shope, DO PCP - General Family Medicine  09/04/15   Mardee Shackle, NP (Inactive) Nurse Practitioner Obstetrics and Gynecology  01/29/20   Jennefer Moats, DPM Consulting Physician Podiatry  01/03/22     Chief Complaint  Patient presents with   Medical Management of Chronic Issues   Ear Fullness    Acute issue for appt @ 9:40     Subjective:Suzanne Jarvis is a 33 y.o.  pt present for an OV for annual physical and combined chronic condition appointment all past medical history was reviewed and updated today.  Hypertension: Pt reports compliance with amlodipine  . Patient denies chest pain, shortness of breath, dizziness or lower extremity edema.  Rf: lupus  Irritable bowel syndrome with diarrhea Patient reports her irritable bowel syndrome is well-controlled on Lomotil  and Bentyl  as needed.  She only takes this medication when she is going to be away from home and consuming meals.   She rarely requires lomotil  use  Right Ear fullness: Fluid present in right ear, pain associated. No fever, chills. Present over 2 weeks.       04/10/2024    8:55 AM 05/17/2023    2:13 PM 02/22/2023    8:33 AM 10/19/2022    9:34 AM 09/29/2021    9:23 AM  Depression screen PHQ 2/9  Decreased Interest 0 0 0 0 0  Down, Depressed, Hopeless 0 0 0 0 0  PHQ - 2 Score 0 0 0 0 0  Altered sleeping 1 2 1     Tired, decreased energy 2 2 2     Change in appetite 0 0 0    Feeling bad or failure about yourself  0 0 0    Trouble concentrating 1 1 1     Moving slowly or fidgety/restless 0 0 0    Suicidal thoughts 0 0 0    PHQ-9 Score 4 5 4     Difficult doing work/chores Not difficult at all Not difficult at all Not difficult at all      Allergies  Allergen Reactions   Aspirin Anaphylaxis   Adrenal Cortex Extract Hives   Amoxicillin Hives   Hyoscyamine  Other  (See Comments)   Mixed Grasses    Novocain [Procaine] Swelling   Other Hives    grass, weed, ragweed, trees, cat, mold, dog, dust mites. Borderline to cockroach.   Paxil  [Paroxetine  Hcl]     Dizziness    Penicillins     REACTION: hives   Social History   Social History Narrative   Right Handed    Lives in a two story condo on the second floor    Goes to the gyn 3 times a week.      Has a [name] that she called her son    Past Medical History:  Diagnosis Date   ADD (attention deficit disorder) 07/08/2020   Angio-edema    ASCUS with positive high risk HPV cervical 03/26/2019   01/2019; negative colposcopy 03/2019; plan repeat pap and HPV test 01/2020   Asthma    Bilateral hand numbness 04/14/2021   Chronic headaches    Concussion with no loss of consciousness 07/08/2020   May 2021   Eczema    Gastroesophageal reflux disease 03/22/2023   History of brain concussion 07/08/2020   May 2021     IBS (irritable bowel syndrome)  Metatarsalgia of right foot 07/07/2021   Penicillin allergy 04/28/2020   Seasonal allergies 01/14/2021   Seasonal and perennial allergic rhinoconjunctivitis 07/28/2020   Tonsillitis    Urticaria    Past Surgical History:  Procedure Laterality Date   WISDOM TOOTH EXTRACTION     Family History  Problem Relation Age of Onset   Allergies Mother    Asthma Mother    Hypertension Mother    Allergic rhinitis Mother    Urticaria Mother    Healthy Father    Allergic rhinitis Sister    Eczema Sister    Anemia Sister    Hashimoto's thyroiditis Brother    ADD / ADHD Brother    Arthritis Maternal Grandmother    Irritable bowel syndrome Maternal Grandmother    Breast cancer Maternal Grandmother    Lupus Cousin    Hashimoto's thyroiditis Cousin    Lung cancer Other        paternal great grandfather   Lung cancer Other        maternal great grandfather   Pancreatic cancer Other        great aunt   Colon cancer Neg Hx    Allergies as of  04/10/2024       Reactions   Aspirin Anaphylaxis   Adrenal Cortex Extract Hives   Amoxicillin Hives   Hyoscyamine  Other (See Comments)   Mixed Grasses    Novocain [procaine] Swelling   Other Hives   grass, weed, ragweed, trees, cat, mold, dog, dust mites. Borderline to cockroach.   Paxil  [paroxetine  Hcl]    Dizziness   Penicillins    REACTION: hives        Medication List        Accurate as of Apr 10, 2024  9:15 AM. If you have any questions, ask your nurse or doctor.          albuterol  108 (90 Base) MCG/ACT inhaler Commonly known as: Ventolin  HFA Inhale 2 puffs into the lungs every 4 (four) hours as needed for wheezing or shortness of breath (coughing fits).   amLODipine  2.5 MG tablet Commonly known as: NORVASC  Take 1 tablet (2.5 mg total) by mouth daily.   B-12 1000 MCG Tabs Take 2 tablets by mouth daily.   budesonide  0.5 MG/2ML nebulizer solution Commonly known as: Pulmicort  Take 1 vial and mix into saline rinses twice a day.   colchicine 0.6 MG tablet Take 0.6 mg by mouth as needed.   dicyclomine  10 MG capsule Commonly known as: BENTYL  Take 1 capsule (10 mg total) by mouth 4 (four) times daily as needed for spasms.   diphenoxylate -atropine  2.5-0.025 MG tablet Commonly known as: LOMOTIL  TAKE 1 TABLET BY MOUTH FOUR TIMES DAILY AS NEEDED FOR DIARRHEA OR LOOSE STOOLS   FORTIFY PROBIOTIC WOMENS PO Take by mouth.   Hailey 24 Fe 1-20 MG-MCG(24) tablet Generic drug: Norethindrone Acetate-Ethinyl Estrad-FE Take 1 tablet by mouth daily.   levocetirizine 5 MG tablet Commonly known as: XYZAL  Take 1 tablet (5 mg total) by mouth every evening.   Magnesium 100 MG Tabs Take by mouth. W/ calcium and zinc   montelukast  10 MG tablet Commonly known as: SINGULAIR  Take 1 tablet (10 mg total) by mouth at bedtime.   multivitamin with minerals tablet Take 1 tablet by mouth daily.   Olopatadine  HCl 0.2 % Soln Apply 1 drop to eye daily as needed (itchy/watery  eyes).   ondansetron  4 MG tablet Commonly known as: Zofran  Take 1 tablet (4 mg total) by mouth  every 8 (eight) hours as needed for nausea or vomiting.   Vitamin D  (Cholecalciferol) 25 MCG (1000 UT) Caps Take by mouth.        All past medical history, surgical history, allergies, family history, immunizations andmedications were updated in the EMR today and reviewed under the history and medication portions of their EMR.     ROS: Negative, with the exception of above mentioned in HPI   Objective:  BP 118/78   Pulse 71   Temp 99.1 F (37.3 C)   Wt 140 lb 6.4 oz (63.7 kg)   SpO2 99%   BMI 23.36 kg/m  Body mass index is 23.36 kg/m. Physical Exam Vitals and nursing note reviewed.  Constitutional:      General: She is not in acute distress.    Appearance: Normal appearance. She is not ill-appearing, toxic-appearing or diaphoretic.  HENT:     Head: Normocephalic and atraumatic.     Right Ear: Tympanic membrane normal. There is no impacted cerumen.     Left Ear: Tympanic membrane and ear canal normal. There is no impacted cerumen.     Ears:     Comments: Right EAC: small collection of ear wax against TM 3-7 o'clock position.  Eyes:     General: No scleral icterus.       Right eye: No discharge.        Left eye: No discharge.     Extraocular Movements: Extraocular movements intact.     Conjunctiva/sclera: Conjunctivae normal.     Pupils: Pupils are equal, round, and reactive to light.  Cardiovascular:     Rate and Rhythm: Normal rate and regular rhythm.  Pulmonary:     Effort: Pulmonary effort is normal. No respiratory distress.     Breath sounds: Normal breath sounds. No wheezing, rhonchi or rales.  Musculoskeletal:     Right lower leg: No edema.     Left lower leg: No edema.  Skin:    General: Skin is warm.     Findings: No rash.  Neurological:     Mental Status: She is alert and oriented to person, place, and time. Mental status is at baseline.     Motor: No  weakness.     Gait: Gait normal.  Psychiatric:        Mood and Affect: Mood normal.        Behavior: Behavior normal.        Thought Content: Thought content normal.        Judgment: Judgment normal.     No results found. No results found. No results found for this or any previous visit (from the past 24 hours).  Assessment/Plan: Suzanne Jarvis is a 33 y.o. female present for OV for  Chronic Conditions/illness Management Irritable bowel syndrome with diarrhea Stable Continue Lomotil  as needed Continue Bentyl  as needed  Benign hypertension/raynauds Stable Continue amlodipine  2.5 mg every day.   Ear fullness/cerumen: Procedure: Cerumen -lavage Patient was verbally consented to procedure. Water-peroxide solution was applied and gentle ear lavage performed on RIGHT ear(s).  There were no complications.  Tympanic membrane was visualized after disimpaction.  Tympanic membrane(s) intact.  Auditory canal(s) WNL.  Patient tolerated procedure well.  Patient reported relief of symptoms after removal of cerumen.   Reviewed expectations re: course of current medical issues. Discussed self-management of symptoms. Outlined signs and symptoms indicating need for more acute intervention. Patient verbalized understanding and all questions were answered. Patient received an After-Visit Summary.   No orders  of the defined types were placed in this encounter.   Meds ordered this encounter  Medications   amLODipine  (NORVASC ) 2.5 MG tablet    Sig: Take 1 tablet (2.5 mg total) by mouth daily.    Dispense:  90 tablet    Refill:  2   diphenoxylate -atropine  (LOMOTIL ) 2.5-0.025 MG tablet    Sig: TAKE 1 TABLET BY MOUTH FOUR TIMES DAILY AS NEEDED FOR DIARRHEA OR LOOSE STOOLS    Dispense:  180 tablet    Refill:  1    Referral Orders  No referral(s) requested today    Note is dictated utilizing voice recognition software. Although note has been proof read prior to signing, occasional  typographical errors still can be missed. If any questions arise, please do not hesitate to call for verification.   electronically signed by:  Napolean Backbone, DO  [facility] [place]

## 2024-04-10 NOTE — Patient Instructions (Addendum)

## 2024-05-02 ENCOUNTER — Encounter (INDEPENDENT_AMBULATORY_CARE_PROVIDER_SITE_OTHER): Payer: Self-pay

## 2024-05-13 DIAGNOSIS — Z79899 Other long term (current) drug therapy: Secondary | ICD-10-CM | POA: Diagnosis not present

## 2024-05-13 DIAGNOSIS — M359 Systemic involvement of connective tissue, unspecified: Secondary | ICD-10-CM | POA: Diagnosis not present

## 2024-05-20 ENCOUNTER — Ambulatory Visit
Admission: RE | Admit: 2024-05-20 | Discharge: 2024-05-20 | Disposition: A | Discharge status: Home / Self Care | Source: Ambulatory Visit | Attending: Family Medicine | Admitting: Family Medicine

## 2024-05-20 VITALS — BP 126/81 | HR 78 | Temp 99.5°F | Resp 19

## 2024-05-20 DIAGNOSIS — M545 Low back pain, unspecified: Secondary | ICD-10-CM

## 2024-05-20 DIAGNOSIS — R0781 Pleurodynia: Secondary | ICD-10-CM | POA: Diagnosis not present

## 2024-05-20 DIAGNOSIS — M62830 Muscle spasm of back: Secondary | ICD-10-CM

## 2024-05-20 LAB — POCT URINALYSIS DIP (MANUAL ENTRY)
Bilirubin, UA: NEGATIVE
Blood, UA: NEGATIVE
Glucose, UA: NEGATIVE mg/dL
Ketones, POC UA: NEGATIVE mg/dL
Leukocytes, UA: NEGATIVE
Nitrite, UA: NEGATIVE
Protein Ur, POC: NEGATIVE mg/dL
Spec Grav, UA: 1.01 (ref 1.010–1.025)
Urobilinogen, UA: 0.2 U/dL
pH, UA: 5.5 (ref 5.0–8.0)

## 2024-05-20 MED ORDER — CELECOXIB 200 MG PO CAPS: 200.0000 mg | ORAL_CAPSULE | Freq: Every day | ORAL | 0 refills | Status: AC

## 2024-05-20 MED ORDER — METHOCARBAMOL 500 MG PO TABS: 500.0000 mg | ORAL_TABLET | Freq: Three times a day (TID) | ORAL | 0 refills | Status: AC | PRN

## 2024-05-20 MED ORDER — METHYLPREDNISOLONE ACETATE 80 MG/ML IJ SUSP: 80.0000 mg | Freq: Once | INTRAMUSCULAR | Status: DC

## 2024-05-20 NOTE — ED Provider Notes (Signed)
 [facility]    CSN: [id number] Arrival date & time: 05/20/24  0816      History   Chief Complaint Chief Complaint  Patient presents with   Back Pain    HPI Suzanne Jarvis is a 33 y.o. female.   HPI Pleasant 33 year old female presents with lower back pain for 2 weeks.  Patient reports that she is very active in the gym and believes that she may have pulled something in her back; although, she is concerned with possible kidney problem.  PMH significant for ADD, IBS, and HTN.  Past Medical History:  Diagnosis Date   ADD (attention deficit disorder) 07/08/2020   Angio-edema    ASCUS with positive high risk HPV cervical 03/26/2019   01/2019; negative colposcopy 03/2019; plan repeat pap and HPV test 01/2020   Asthma    Bilateral hand numbness 04/14/2021   Chronic headaches    Concussion with no loss of consciousness 07/08/2020   May 2021   Eczema    Gastroesophageal reflux disease 03/22/2023   History of brain concussion 07/08/2020   May 2021     IBS (irritable bowel syndrome)    Metatarsalgia of right foot 07/07/2021   Penicillin allergy 04/28/2020   Seasonal allergies 01/14/2021   Seasonal and perennial allergic rhinoconjunctivitis 07/28/2020   Tonsillitis    Urticaria     Patient Active Problem List   Diagnosis Date Noted   Cerumen debris on tympanic membrane, right 04/10/2024   Sensation of fullness in ear 04/10/2024   Reactive airway disease, mild intermittent, uncomplicated 03/22/2023   Benign hypertension 03/22/2023   Multiple drug allergies 09/21/2022   Aphthous ulcer of mouth 09/29/2021   Positive ANA (antinuclear antibody) 02/24/2021   Seasonal and perennial allergic rhinoconjunctivitis 07/28/2020   IBS (irritable bowel syndrome) 09/30/2015   Vitamin B 12 deficiency 12/10/2014    Past Surgical History:  Procedure Laterality Date   WISDOM TOOTH EXTRACTION      OB History   No obstetric history on file.      Home Medications     Prior to Admission medications   Medication Sig Start Date End Date Taking? Authorizing Provider  celecoxib  (CELEBREX ) 200 MG capsule Take 1 capsule (200 mg total) by mouth daily for 15 days. 05/20/24 06/04/24 Yes Teddy Sharper, FNP  methocarbamol  (ROBAXIN ) 500 MG tablet Take 1 tablet (500 mg total) by mouth 3 (three) times daily as needed for up to 7 days. 05/20/24 05/27/24 Yes Teddy Sharper, FNP  albuterol  (VENTOLIN  HFA) 108 (90 Base) MCG/ACT inhaler Inhale 2 puffs into the lungs every 4 (four) hours as needed for wheezing or shortness of breath (coughing fits). 09/27/23   Luke Orlan HERO, DO  amLODipine  (NORVASC ) 2.5 MG tablet Take 1 tablet (2.5 mg total) by mouth daily. 04/10/24   [name], [name], DO  budesonide  (PULMICORT ) 0.5 MG/2ML nebulizer solution Take 1 vial and mix into saline rinses twice a day. Patient not taking: Reported on 04/10/2024 03/27/24   Luke Orlan HERO, DO  colchicine 0.6 MG tablet Take 0.6 mg by mouth as needed.    [provider]  Cyanocobalamin (B-12) 1000 MCG TABS Take 2 tablets by mouth daily.    [provider]  dicyclomine  (BENTYL ) 10 MG capsule Take 1 capsule (10 mg total) by mouth 4 (four) times daily as needed for spasms. 10/25/23   [name], [name], DO  diphenoxylate -atropine  (LOMOTIL ) 2.5-0.025 MG tablet TAKE 1 TABLET BY MOUTH FOUR TIMES DAILY AS NEEDED FOR DIARRHEA OR LOOSE  STOOLS 04/10/24   [name], [name], DO  HAILEY 24 FE 1-20 MG-MCG(24) tablet Take 1 tablet by mouth daily. 01/16/24   [provider]  levocetirizine (XYZAL ) 5 MG tablet Take 1 tablet (5 mg total) by mouth every evening. 09/27/23   Luke Orlan HERO, DO  Magnesium 100 MG TABS Take by mouth. W/ calcium and zinc Patient not taking: Reported on 04/10/2024    [provider]  montelukast  (SINGULAIR ) 10 MG tablet Take 1 tablet (10 mg total) by mouth at bedtime. 09/27/23   Luke Orlan HERO, DO  Multiple Vitamins-Minerals (MULTIVITAMIN WITH MINERALS) tablet Take 1 tablet by mouth daily.     [provider]  Olopatadine  HCl 0.2 % SOLN Apply 1 drop to eye daily as needed (itchy/watery eyes). 03/27/24   Luke Orlan HERO, DO  ondansetron  (ZOFRAN ) 4 MG tablet Take 1 tablet (4 mg total) by mouth every 8 (eight) hours as needed for nausea or vomiting. Patient not taking: Reported on 04/10/2024 03/30/24   Richad Jon HERO, NP  Probiotic Product (FORTIFY PROBIOTIC WOMENS PO) Take by mouth.    [provider]  Vitamin D , Cholecalciferol, 25 MCG (1000 UT) CAPS Take by mouth.    [provider]    Family History Family History  Problem Relation Age of Onset   Allergies Mother    Asthma Mother    Hypertension Mother    Allergic rhinitis Mother    Urticaria Mother    Healthy Father    Allergic rhinitis Sister    Eczema Sister    Anemia Sister    Hashimoto's thyroiditis Brother    ADD / ADHD Brother    Arthritis Maternal Grandmother    Irritable bowel syndrome Maternal Grandmother    Breast cancer Maternal Grandmother    Lupus Cousin    Hashimoto's thyroiditis Cousin    Lung cancer Other        paternal great grandfather   Lung cancer Other        maternal great grandfather   Pancreatic cancer Other        great aunt   Colon cancer Neg Hx     Social History Social History   Tobacco Use   Smoking status: Former    Current packs/day: 0.00    Average packs/day: 0.3 packs/day for 6.0 years (1.5 ttl pk-yrs)    Types: Cigarettes    Start date: 2012    Quit date: 2018    Years since quitting: 7.5   Smokeless tobacco: Never  Vaping Use   Vaping status: Some Days  Substance Use Topics   Alcohol use: Yes    Comment: Rarely   Drug use: No     Allergies   Aspirin, Adrenal cortex extract, Amoxicillin, Hyoscyamine , Mixed grasses, Novocain [procaine], Other, Paxil  [paroxetine  hcl], and Penicillins   Review of Systems Review of Systems  Musculoskeletal:  Positive for back pain.     Physical Exam Triage Vital Signs ED Triage Vitals  Encounter  Vitals Group     BP 05/20/24 0831 126/81     Girls Systolic BP Percentile --      Girls Diastolic BP Percentile --      Boys Systolic BP Percentile --      Boys Diastolic BP Percentile --      Pulse Rate 05/20/24 0831 78     Resp 05/20/24 0831 19     Temp 05/20/24 0831 99.5 F (37.5 C)     Temp src --  SpO2 05/20/24 0831 98 %     Weight --      Height --      Head Circumference --      Peak Flow --      Pain Score 05/20/24 0829 3     Pain Loc --      Pain Education --      Exclude from Growth Chart --    No data found.  Updated Vital Signs BP 126/81   Pulse 78   Temp 99.5 F (37.5 C)   Resp 19   LMP 05/11/2024   SpO2 98%      Physical Exam Vitals and nursing note reviewed.  Constitutional:      General: She is not in acute distress.    Appearance: Normal appearance. She is normal weight. She is not ill-appearing.  HENT:     Head: Normocephalic and atraumatic.     Mouth/Throat:     Mouth: Mucous membranes are moist.     Pharynx: Oropharynx is clear.  Eyes:     Extraocular Movements: Extraocular movements intact.     Conjunctiva/sclera: Conjunctivae normal.     Pupils: Pupils are equal, round, and reactive to light.  Cardiovascular:     Rate and Rhythm: Normal rate and regular rhythm.     Pulses: Normal pulses.     Heart sounds: Normal heart sounds.  Pulmonary:     Effort: Pulmonary effort is normal.     Breath sounds: Normal breath sounds. No wheezing, rhonchi or rales.  Abdominal:     Tenderness: There is left CVA tenderness.  Musculoskeletal:        General: Normal range of motion.     Comments: Left posterior rib area (inferior aspect): TTP per patient  Skin:    General: Skin is warm and dry.  Neurological:     General: No focal deficit present.     Mental Status: She is alert and oriented to person, place, and time. Mental status is at baseline.  Psychiatric:        Mood and Affect: Mood normal.        Behavior: Behavior normal.      UC  Treatments / Results  Labs (all labs ordered are listed, but only abnormal results are displayed) Labs Reviewed  POCT URINALYSIS DIP (MANUAL ENTRY) - Normal    EKG   Radiology No results found.  Procedures Procedures (including critical care time)  Medications Ordered in UC Medications  methylPREDNISolone  acetate (DEPO-MEDROL ) injection 80 mg (has no administration in time range)    Initial Impression / Assessment and Plan / UC Course  I have reviewed the triage vital signs and the nursing notes.  Pertinent labs & imaging results that were available during my care of the patient were reviewed by me and considered in my medical decision making (see chart for details).     MDM: 1.  Rib pain on the left side-IM Depo-Medrol  80 mg given once in clinic and prior to discharge, Rx'd Celebrex  200 mg capsule: Take 1 capsule daily x 15 days; 2.  Muscle spasm of back-Rx'd Robaxin  500 mg tablet: Take 1 tablet 3 times daily, as needed for left-sided back spasms. Advised patient to take medication as directed with food to completion.  Advised may take Robaxin  daily or as needed for accompanying muscle spasm of left rib area.  Encouraged to increase daily water intake to 64 ounces per day while taking these medications.  Advised if symptoms worsen and/or  unresolved please follow-up with your PCP or here for further evaluation.  Patient discharged home, hemodynamically stable. Final Clinical Impressions(s) / UC Diagnoses   Final diagnoses:  Rib pain on left side  Muscle spasm of back     Discharge Instructions      Advised patient to take medication as directed with food to completion.  Advised may take Robaxin  daily or as needed for accompanying muscle spasm of left rib area.  Encouraged to increase daily water intake to 64 ounces per day while taking these medications.  Advised if symptoms worsen and/or unresolved please follow-up with your PCP or here for further evaluation.     ED  Prescriptions     Medication Sig Dispense Auth. Provider   celecoxib  (CELEBREX ) 200 MG capsule Take 1 capsule (200 mg total) by mouth daily for 15 days. 15 capsule Edris Friedt, FNP   methocarbamol  (ROBAXIN ) 500 MG tablet Take 1 tablet (500 mg total) by mouth 3 (three) times daily as needed for up to 7 days. 21 tablet Annaleese Guier, FNP      PDMP not reviewed this encounter.   Teddy Sharper, FNP 05/20/24 [phone]

## 2024-05-20 NOTE — Discharge Instructions (Addendum)
 Advised patient to take medication as directed with food to completion.  Advised may take Robaxin  daily or as needed for accompanying muscle spasm of left rib area.  Encouraged to increase daily water intake to 64 ounces per day while taking these medications.  Advised if symptoms worsen and/or unresolved please follow-up with your PCP or here for further evaluation.

## 2024-05-20 NOTE — ED Triage Notes (Signed)
 Pt presents to uc with co back pain for 2 weeks. Pt reports she is very active in the gym and thought she pulled something in her back but has been worried it could be kidney related. Denies any dysuria, hematuria. Pt has been taking motrin  with minimal improvment

## 2024-05-31 NOTE — Telephone Encounter (Signed)
 Suzanne Jarvis has been scheduled for 08/05/24 at 10:00 am with Dr. Patel-ENT

## 2024-06-19 DIAGNOSIS — Z87891 Personal history of nicotine dependence: Secondary | ICD-10-CM | POA: Diagnosis not present

## 2024-06-19 DIAGNOSIS — Z880 Allergy status to penicillin: Secondary | ICD-10-CM | POA: Diagnosis not present

## 2024-06-19 DIAGNOSIS — Z793 Long term (current) use of hormonal contraceptives: Secondary | ICD-10-CM | POA: Diagnosis not present

## 2024-06-19 DIAGNOSIS — Z8249 Family history of ischemic heart disease and other diseases of the circulatory system: Secondary | ICD-10-CM | POA: Diagnosis not present

## 2024-06-19 DIAGNOSIS — I10 Essential (primary) hypertension: Secondary | ICD-10-CM | POA: Diagnosis not present

## 2024-06-19 DIAGNOSIS — K219 Gastro-esophageal reflux disease without esophagitis: Secondary | ICD-10-CM | POA: Diagnosis not present

## 2024-06-19 DIAGNOSIS — Z818 Family history of other mental and behavioral disorders: Secondary | ICD-10-CM | POA: Diagnosis not present

## 2024-06-19 DIAGNOSIS — Z886 Allergy status to analgesic agent status: Secondary | ICD-10-CM | POA: Diagnosis not present

## 2024-06-19 DIAGNOSIS — Z809 Family history of malignant neoplasm, unspecified: Secondary | ICD-10-CM | POA: Diagnosis not present

## 2024-06-19 DIAGNOSIS — J45909 Unspecified asthma, uncomplicated: Secondary | ICD-10-CM | POA: Diagnosis not present

## 2024-06-19 DIAGNOSIS — Z833 Family history of diabetes mellitus: Secondary | ICD-10-CM | POA: Diagnosis not present

## 2024-06-19 DIAGNOSIS — K581 Irritable bowel syndrome with constipation: Secondary | ICD-10-CM | POA: Diagnosis not present

## 2024-08-05 ENCOUNTER — Encounter (INDEPENDENT_AMBULATORY_CARE_PROVIDER_SITE_OTHER): Payer: Self-pay | Admitting: Otolaryngology

## 2024-08-05 ENCOUNTER — Ambulatory Visit (INDEPENDENT_AMBULATORY_CARE_PROVIDER_SITE_OTHER): Admitting: Otolaryngology

## 2024-08-05 VITALS — BP 139/88 | HR 85 | Ht 65.0 in | Wt 140.0 lb

## 2024-08-05 DIAGNOSIS — Z87891 Personal history of nicotine dependence: Secondary | ICD-10-CM | POA: Diagnosis not present

## 2024-08-05 DIAGNOSIS — R0981 Nasal congestion: Secondary | ICD-10-CM | POA: Diagnosis not present

## 2024-08-05 DIAGNOSIS — H1010 Acute atopic conjunctivitis, unspecified eye: Secondary | ICD-10-CM

## 2024-08-05 DIAGNOSIS — J0181 Other acute recurrent sinusitis: Secondary | ICD-10-CM | POA: Diagnosis not present

## 2024-08-05 DIAGNOSIS — J342 Deviated nasal septum: Secondary | ICD-10-CM

## 2024-08-05 DIAGNOSIS — J3489 Other specified disorders of nose and nasal sinuses: Secondary | ICD-10-CM | POA: Diagnosis not present

## 2024-08-05 DIAGNOSIS — J343 Hypertrophy of nasal turbinates: Secondary | ICD-10-CM

## 2024-08-05 DIAGNOSIS — R0982 Postnasal drip: Secondary | ICD-10-CM | POA: Diagnosis not present

## 2024-08-05 MED ORDER — AZELASTINE HCL 0.1 % NA SOLN: 2.0000 | Freq: Two times a day (BID) | NASAL | 12 refills | Status: DC

## 2024-08-05 MED ORDER — DOXYCYCLINE HYCLATE 100 MG PO TABS: 100.0000 mg | ORAL_TABLET | Freq: Two times a day (BID) | ORAL | 0 refills | Status: AC

## 2024-08-05 MED ORDER — FLONASE SENSIMIST 27.5 MCG/SPRAY NA SUSP: 2.0000 | Freq: Two times a day (BID) | NASAL | 12 refills | Status: DC

## 2024-08-05 NOTE — Patient Instructions (Addendum)
 Silent Sinus Syndrome  Use two sprays of flonase  and astelin  spray two sprays both into the neti Pot -- flush half on each side.   Use doxycycline  twice daily for 1 week

## 2024-08-05 NOTE — Progress Notes (Signed)
 Dear Dr. Luke, Here is my assessment for our mutual patient, Suzanne Jarvis. Thank you for allowing me the opportunity to care for your patient. Please do not hesitate to contact me should you have any other questions. Sincerely, Dr. Eldora Blanch  Otolaryngology Clinic Note  HISTORY: Suzanne Jarvis is a 33 y.o. female kindly referred by Dr. Luke for evaluation of recurrent sinusitis  Initial visit (07/2024): She reports that she has a history of allergic rhinitis with multiple positives on prior allergy testing. Symptoms include significant mucoid rhinorrhea and more bothersome PND than anterior rhinorrhea. Some congestion as well; Bilateral symptoms. She denies any discoloration, but reports that it is thick and feels like it gets stuck. Associated symptoms include bilateral ear fullness and pressure. She does not report significant sinonasal pain/pressure (occasional but not super often). She did have multiply recurrent sinus infections starting early 2024 and multiple years before that -- she did get antibiotics for it (every 3 months) but lately has just been toughing it out. No abx over past few months. Symptoms are year round and when she does get sinus infections (discolored drainage and pressure), abx do help but not with the drainage. Occassional congestion.   Does have typical AR symptoms - sneezing, itching, worse rhinorrhea - more so seasonal, spring and fall typically  She has seen allergy, and tried AIT, rinses, allegra, xyzal , singulair . She has tried budesonide  rinses and felt sick afterwards so stopped it --- used it for about a week or so.   Allergy testing has been done. No previous sinonasal surgery. No diplopia  She is currently using PO anthistamine, neti pot (multiple times per day) .  GLP-1: no AP/AC: no  Tobacco: former, quit  PMHx: Unspecified connective tissue disease, AR, Mild reactive airway disease, Raynaud's  RADIOGRAPHIC EVALUATION AND INDEPENDENT REVIEW  OF OTHER RECORDS: Dr. Luke (03/27/2024): noted reactive airway disease, frequent URI; persistent mucus production A/P, fluid in ear; on allegra, xyzal , singulair ; no nasal sprays because of epistaxis; using neti pot; prior AIT; experiencing antibiotics once or twice since last visit, saw ENT prior; Dx: Mild Reactive airway disease, recurrent sinus infections; Rx: PO antihistamine, singulair , aline spray, budesonide  rinses, ref to ENT Allergy testing 2021: + grass, weed, ragweed, trees, cat, mold, dog, dust bites. Labs CBC and CMP 10/25/2023: BUN/Cr 9/0.81; WBC 6.5, Eos prior ~200 MRI Brain 05/09/2020 independently interpreted with respect to sinuses: right septal deviation, bilateral max MPT (silent sinus?) with bilateral ethmoid and frontal MPT as well  Past Medical History:  Diagnosis Date   ADD (attention deficit disorder) 07/08/2020   Angio-edema    ASCUS with positive high risk HPV cervical 03/26/2019   01/2019; negative colposcopy 03/2019; plan repeat pap and HPV test 01/2020   Asthma    Bilateral hand numbness 04/14/2021   Chronic headaches    Concussion with no loss of consciousness 07/08/2020   May 2021   Eczema    Gastroesophageal reflux disease 03/22/2023   History of brain concussion 07/08/2020   May 2021     IBS (irritable bowel syndrome)    Metatarsalgia of right foot 07/07/2021   Penicillin allergy 04/28/2020   Seasonal allergies 01/14/2021   Seasonal and perennial allergic rhinoconjunctivitis 07/28/2020   Tonsillitis    Urticaria    Past Surgical History:  Procedure Laterality Date   WISDOM TOOTH EXTRACTION     Family History  Problem Relation Age of Onset   Allergies Mother    Asthma Mother    Hypertension Mother  Allergic rhinitis Mother    Urticaria Mother    Healthy Father    Allergic rhinitis Sister    Eczema Sister    Anemia Sister    Hashimoto's thyroiditis Brother    ADD / ADHD Brother    Arthritis Maternal Grandmother    Irritable bowel syndrome  Maternal Grandmother    Breast cancer Maternal Grandmother    Lupus Cousin    Hashimoto's thyroiditis Cousin    Lung cancer Other        paternal great grandfather   Lung cancer Other        maternal great grandfather   Pancreatic cancer Other        great aunt   Colon cancer Neg Hx    Social History   Tobacco Use   Smoking status: Former    Current packs/day: 0.00    Average packs/day: 0.3 packs/day for 6.0 years (1.5 ttl pk-yrs)    Types: Cigarettes    Start date: 2012    Quit date: 2018    Years since quitting: 7.7   Smokeless tobacco: Never  Substance Use Topics   Alcohol use: Yes    Comment: Rarely   Allergies  Allergen Reactions   Aspirin Anaphylaxis   Adrenal Cortex Extract Hives   Amoxicillin Hives   Hyoscyamine  Other (See Comments)   Mixed Grasses    Novocain [Procaine] Swelling   Other Hives    grass, weed, ragweed, trees, cat, mold, dog, dust mites. Borderline to cockroach.   Paxil  [Paroxetine  Hcl]     Dizziness    Penicillins     REACTION: hives   Current Outpatient Medications  Medication Sig Dispense Refill   albuterol  (VENTOLIN  HFA) 108 (90 Base) MCG/ACT inhaler Inhale 2 puffs into the lungs every 4 (four) hours as needed for wheezing or shortness of breath (coughing fits). 18 g 1   amLODipine  (NORVASC ) 2.5 MG tablet Take 1 tablet (2.5 mg total) by mouth daily. 90 tablet 2   azelastine  (ASTELIN ) 0.1 % nasal spray Place 2 sprays into both nostrils 2 (two) times daily. Use in each nostril as directed 30 mL 12   colchicine 0.6 MG tablet Take 0.6 mg by mouth as needed.     Cyanocobalamin (B-12) 1000 MCG TABS Take 2 tablets by mouth daily.     dicyclomine  (BENTYL ) 10 MG capsule Take 1 capsule (10 mg total) by mouth 4 (four) times daily as needed for spasms. 120 capsule 11   diphenoxylate -atropine  (LOMOTIL ) 2.5-0.025 MG tablet TAKE 1 TABLET BY MOUTH FOUR TIMES DAILY AS NEEDED FOR DIARRHEA OR LOOSE STOOLS 180 tablet 1   doxycycline  (VIBRA -TABS) 100 MG  tablet Take 1 tablet (100 mg total) by mouth 2 (two) times daily for 7 days. 14 tablet 0   fluticasone  (FLONASE  SENSIMIST) 27.5 MCG/SPRAY nasal spray Place 2 sprays into the nose in the morning and at bedtime. 10 g 12   HAILEY 24 FE 1-20 MG-MCG(24) tablet Take 1 tablet by mouth daily.     levocetirizine (XYZAL ) 5 MG tablet Take 1 tablet (5 mg total) by mouth every evening. 90 tablet 3   montelukast  (SINGULAIR ) 10 MG tablet Take 1 tablet (10 mg total) by mouth at bedtime. 90 tablet 3   Multiple Vitamins-Minerals (MULTIVITAMIN WITH MINERALS) tablet Take 1 tablet by mouth daily.     Olopatadine  HCl 0.2 % SOLN Apply 1 drop to eye daily as needed (itchy/watery eyes). 2.5 mL 5   Probiotic Product (FORTIFY PROBIOTIC WOMENS PO) Take by mouth.  Vitamin D , Cholecalciferol, 25 MCG (1000 UT) CAPS Take by mouth.     budesonide  (PULMICORT ) 0.5 MG/2ML nebulizer solution Take 1 vial and mix into saline rinses twice a day. (Patient not taking: Reported on 08/05/2024) 120 mL 2   Magnesium 100 MG TABS Take by mouth. W/ calcium and zinc (Patient not taking: Reported on 08/05/2024)     ondansetron  (ZOFRAN ) 4 MG tablet Take 1 tablet (4 mg total) by mouth every 8 (eight) hours as needed for nausea or vomiting. (Patient not taking: Reported on 08/05/2024) 20 tablet 0   No current facility-administered medications for this visit.   BP 139/88 (BP Location: Left Arm, Patient Position: Sitting, Cuff Size: Normal)   Pulse 85   Ht 5' 5 (1.651 m)   Wt 140 lb (63.5 kg)   SpO2 98%   BMI 23.30 kg/m   PHYSICAL EXAM:  BP 139/88 (BP Location: Left Arm, Patient Position: Sitting, Cuff Size: Normal)   Pulse 85   Ht 5' 5 (1.651 m)   Wt 140 lb (63.5 kg)   SpO2 98%   BMI 23.30 kg/m    Salient findings:  CN II-XII intact Bilateral EAC clear and TM intact with well pneumatized middle ear spaces Nose: Anterior rhinoscopy reveals septum deviation right, bilateral inferior turbinate hypertrophy.  Nasal endoscopy was indicated  to better evaluate the nose and paranasal sinuses, given the patient's history and exam findings, and is detailed below. No lesions of oral cavity/oropharynx No obviously palpable neck masses/lymphadenopathy/thyromegaly No respiratory distress or stridor   PROCEDURE:  Prior to initiating any procedures, risks/benefits/alternatives were explained to the patient and verbal consent obtained. Diagnostic Nasal Endoscopy Pre-procedure diagnosis: Concern for chronic sinusitis, post nasal drip Post-procedure diagnosis: same Indication: See pre-procedure diagnosis and physical exam above Complications: None apparent EBL: 0 mL Anesthesia: Lidocaine  4% and topical decongestant was topically sprayed in each nasal cavity  Description of Procedure:  Patient was identified. A rigid 30 degree endoscope was utilized to evaluate the sinonasal cavities, mucosa, sinus ostia and turbinates and septum.  Overall, signs of mucosal inflammation are mild but noted.  Also noted are concern for inverted/atelectatic uncincate b/l.  No mucopurulence, polyps, or masses noted.   Right Middle meatus: mild mucosal edema Right SE Recess: clear Left MM: mild mucosal edema Left SE Recess: clear  CPT CODE -- 31231 - Mod 25   ASSESSMENT:  33 y.o. with:  1. Other acute recurrent sinusitis   2. Nasal septal deviation   3. Hypertrophy of both inferior nasal turbinates   4. Nasal congestion   5. Nasal obstruction   6. Post-nasal drip    Multiple sinonasal exacerbations in setting of allergies; prior MRI concerning for sinusitis as well. She's tried multiple modalities of medical management but with persistent symptoms.  PLAN: We've discussed issues and options today.  We reviewed the nasal endoscopy images together.  The risks, benefits and alternatives were discussed and questions answered.  She has elected to proceed with:  1) Doxycycline  BID x7d 2) Does not like sprays but uses rinses so will add astelin  and flonase   to Sinus rinse 3)  f/u in 3 months, if continued sx, recommend CT and possible FESS  See below regarding exact medications prescribed this encounter including dosages and route: Meds ordered this encounter  Medications   fluticasone  (FLONASE  SENSIMIST) 27.5 MCG/SPRAY nasal spray    Sig: Place 2 sprays into the nose in the morning and at bedtime.    Dispense:  10 g  Refill:  12   azelastine  (ASTELIN ) 0.1 % nasal spray    Sig: Place 2 sprays into both nostrils 2 (two) times daily. Use in each nostril as directed    Dispense:  30 mL    Refill:  12   doxycycline  (VIBRA -TABS) 100 MG tablet    Sig: Take 1 tablet (100 mg total) by mouth 2 (two) times daily for 7 days.    Dispense:  14 tablet    Refill:  0     Thank you for allowing me the opportunity to care for your patient. Please do not hesitate to contact me should you have any other questions.  Sincerely, Eldora Blanch, MD Otolaryngologist (ENT), [facility] Phone: [phone] Fax: [phone]  MDM:  Level 4: [phone] Complexity/Problems addressed: mod - chronic problems Data complexity: high - independent interpretation of imaging, review of notes, labs - Morbidity: mod  - Prescription Drug prescribed or managed: y  08/10/2024, 4:36 PM

## 2024-09-24 NOTE — Progress Notes (Unsigned)
 Follow Up Note  RE: Suzanne Jarvis MRN: [id number] DOB: 04-22-1991 Date of Office Visit: 09/25/2024  Referring provider: Catherine Charlies LABOR, DO Primary care provider: Catherine Charlies LABOR, DO  Chief Complaint: No chief complaint on file.  History of Present Illness: I had the pleasure of seeing Suzanne Jarvis for a follow up visit at the [facility] on 09/25/2024. She is a 33 y.o. female, who is being followed for reactive airway disease, allergic rhinoconjunctivitis, frequent respiratory infections and multiple drug allergies. Her previous allergy office visit was on 03/27/2024 with Dr. Luke. Today is a regular follow up visit.  Discussed the use of AI scribe software for clinical note transcription with the patient, who gave verbal consent to proceed.  History of Present Illness            08/05/2024 ENT visit: We've discussed issues and options today.  We reviewed the nasal endoscopy images together.  The risks, benefits and alternatives were discussed and questions answered.  She has elected to proceed with:   1) Doxycycline  BID x7d 2) Does not like sprays but uses rinses so will add astelin  and flonase  to Sinus rinse 3)  f/u in 3 months, if continued sx, recommend CT and possible FESS  Assessment and Plan: Suzanne Jarvis is a 33 y.o. female with: Reactive airway disease, mild intermittent, uncomplicated Mainly exertion induced. Continue Singulair  (montelukast ) 10mg  daily at night. May use albuterol  rescue inhaler 2 puffs every 4 to 6 hours as needed for shortness of breath, chest tightness, coughing, and wheezing.  Monitor frequency of use - if you need to use it more than twice per week on a consistent basis let us  know.    Seasonal and perennial allergic rhinoconjunctivitis Past history - Rhino conjunctivitis symptoms from the spring through fall for the past 5 years. 2 sinus infections per year. 2021 skin testing showed: Positive to grass, weed, ragweed, trees,  cat, mold, dog, dust mites. Borderline to cockroach.  Negative to common foods. Started AIT on 05/20/2020 (G-W-RW-T and M-C-D-CR-DM) and stopped in 2023 due to pain. Epistaxis with nasal sprays.  Interim history -  still symptomatic but does not want to use nasal sprays or restart AIT. Continue nvironmental control measures. Take Xyzal  and Allegra daily. Continue Singulair  (montelukast ) 10mg  daily at night. Use olopatadine  eye drops 0.2% once a day as needed for itchy/watery eyes. Nasal saline spray (i.e., Simply Saline) or nasal saline lavage (i.e., NeilMed) is recommended as needed and prior to medicated nasal sprays. Start budesonide  saline wash twice a day as below.  If it causes nosebleeds stop and let us  know.  Refer to ENT for recurrent sinusitis and right ear earwax.    History of frequent upper respiratory infection Past history - Notes frequent sinus infections needing frequent antibiotics. 2022 bloodwork - normal immunoglobulin levels, protective titers for tetanus/diptheria, pneumococcal titers were adequate but not robust. Keep track of infections/antibiotics use.   Multiple drug allergies Past history - Broke out in hives after taking penicillin. Adverse reaction to aspirin - most likely non-IgE mediated. Tolerates other NSAIDs.  Consider penicillin skin testing/drug challenge in the future. More than 90% of patients outgrow their penicillin allergy.  Avoid aspirin. Assessment and Plan              No follow-ups on file.  No orders of the defined types were placed in this encounter.  Lab Orders  No laboratory test(s) ordered today    Diagnostics: Spirometry:  Tracings reviewed.  Her effort: {Blank single:19197::Good reproducible efforts.,It was hard to get consistent efforts and there is a question as to whether this reflects a maximal maneuver.,Poor effort, data can not be interpreted.} FVC: ***L FEV1: ***L, ***% predicted FEV1/FVC ratio:  ***% Interpretation: {Blank single:19197::Spirometry consistent with mild obstructive disease,Spirometry consistent with moderate obstructive disease,Spirometry consistent with severe obstructive disease,Spirometry consistent with possible restrictive disease,Spirometry consistent with mixed obstructive and restrictive disease,Spirometry uninterpretable due to technique,Spirometry consistent with normal pattern,No overt abnormalities noted given today's efforts}.  Please see scanned spirometry results for details.  Skin Testing: {Blank single:19197::Select foods,Environmental allergy panel,Environmental allergy panel and select foods,Food allergy panel,None,Deferred due to recent antihistamines use}. *** Results discussed with patient/family.   Medication List:  Current Outpatient Medications  Medication Sig Dispense Refill   albuterol  (VENTOLIN  HFA) 108 (90 Base) MCG/ACT inhaler Inhale 2 puffs into the lungs every 4 (four) hours as needed for wheezing or shortness of breath (coughing fits). 18 g 1   amLODipine  (NORVASC ) 2.5 MG tablet Take 1 tablet (2.5 mg total) by mouth daily. 90 tablet 2   azelastine  (ASTELIN ) 0.1 % nasal spray Place 2 sprays into both nostrils 2 (two) times daily. Use in each nostril as directed 30 mL 12   budesonide  (PULMICORT ) 0.5 MG/2ML nebulizer solution Take 1 vial and mix into saline rinses twice a day. (Patient not taking: Reported on 08/05/2024) 120 mL 2   colchicine 0.6 MG tablet Take 0.6 mg by mouth as needed.     Cyanocobalamin (B-12) 1000 MCG TABS Take 2 tablets by mouth daily.     dicyclomine  (BENTYL ) 10 MG capsule Take 1 capsule (10 mg total) by mouth 4 (four) times daily as needed for spasms. 120 capsule 11   diphenoxylate -atropine  (LOMOTIL ) 2.5-0.025 MG tablet TAKE 1 TABLET BY MOUTH FOUR TIMES DAILY AS NEEDED FOR DIARRHEA OR LOOSE STOOLS 180 tablet 1   fluticasone  (FLONASE  SENSIMIST) 27.5 MCG/SPRAY nasal spray Place 2 sprays into the  nose in the morning and at bedtime. 10 g 12   HAILEY 24 FE 1-20 MG-MCG(24) tablet Take 1 tablet by mouth daily.     levocetirizine (XYZAL ) 5 MG tablet Take 1 tablet (5 mg total) by mouth every evening. 90 tablet 3   Magnesium 100 MG TABS Take by mouth. W/ calcium and zinc (Patient not taking: Reported on 08/05/2024)     montelukast  (SINGULAIR ) 10 MG tablet Take 1 tablet (10 mg total) by mouth at bedtime. 90 tablet 3   Multiple Vitamins-Minerals (MULTIVITAMIN WITH MINERALS) tablet Take 1 tablet by mouth daily.     Olopatadine  HCl 0.2 % SOLN Apply 1 drop to eye daily as needed (itchy/watery eyes). 2.5 mL 5   ondansetron  (ZOFRAN ) 4 MG tablet Take 1 tablet (4 mg total) by mouth every 8 (eight) hours as needed for nausea or vomiting. (Patient not taking: Reported on 08/05/2024) 20 tablet 0   Probiotic Product (FORTIFY PROBIOTIC WOMENS PO) Take by mouth.     Vitamin D , Cholecalciferol, 25 MCG (1000 UT) CAPS Take by mouth.     No current facility-administered medications for this visit.   Allergies: Allergies  Allergen Reactions   Aspirin Anaphylaxis   Adrenal Cortex Extract Hives   Amoxicillin Hives   Hyoscyamine  Other (See Comments)   Mixed Grasses    Novocain [Procaine] Swelling   Other Hives    grass, weed, ragweed, trees, cat, mold, dog, dust mites. Borderline to cockroach.   Paxil  [Paroxetine  Hcl]     Dizziness    Penicillins  REACTION: hives   I reviewed her past medical history, social history, family history, and environmental history and no significant changes have been reported from her previous visit.  Review of Systems  Constitutional:  Negative for appetite change, chills, fever and unexpected weight change.  HENT:  Positive for congestion. Negative for postnasal drip, rhinorrhea and sneezing.   Respiratory:  Negative for chest tightness and wheezing.   Cardiovascular:  Negative for chest pain.  Gastrointestinal:  Negative for abdominal pain.  Genitourinary:  Negative  for difficulty urinating.  Skin:  Negative for rash.  Allergic/Immunologic: Positive for environmental allergies. Negative for food allergies.  Neurological:  Negative for headaches.    Objective: There were no vitals taken for this visit. There is no height or weight on file to calculate BMI. Physical Exam Vitals and nursing note reviewed.  Constitutional:      Appearance: Normal appearance. She is well-developed and normal weight.  HENT:     Head: Normocephalic and atraumatic.     Right Ear: Tympanic membrane and external ear normal.     Left Ear: Tympanic membrane and external ear normal.     Ears:     Comments: Right ear cerumen.    Nose: Nose normal.     Mouth/Throat:     Mouth: Mucous membranes are moist.     Pharynx: Oropharynx is clear.  Eyes:     Conjunctiva/sclera: Conjunctivae normal.  Cardiovascular:     Rate and Rhythm: Normal rate and regular rhythm.     Heart sounds: Normal heart sounds. No murmur heard.    No friction rub. No gallop.  Pulmonary:     Effort: Pulmonary effort is normal.     Breath sounds: Normal breath sounds. No wheezing, rhonchi or rales.  Abdominal:     Palpations: Abdomen is soft.  Musculoskeletal:     Cervical back: Neck supple.  Skin:    General: Skin is warm.     Findings: No rash.  Neurological:     Mental Status: She is alert and oriented to person, place, and time.  Psychiatric:        Mood and Affect: Mood normal.        Behavior: Behavior normal.    Previous notes and tests were reviewed. The plan was reviewed with the patient/family, and all questions/concerned were addressed.  It was my pleasure to see Suzanne Jarvis today and participate in her care. Please feel free to contact me with any questions or concerns.  Sincerely,  Orlan Cramp, DO Allergy & Immunology  [facility]  [place] office: [phone] [facility] office: [phone]

## 2024-09-25 ENCOUNTER — Other Ambulatory Visit: Payer: Self-pay

## 2024-09-25 ENCOUNTER — Encounter: Payer: Self-pay | Admitting: Allergy

## 2024-09-25 ENCOUNTER — Ambulatory Visit: Admitting: Allergy

## 2024-09-25 VITALS — BP 116/86 | HR 71 | Temp 99.5°F | Resp 16 | Ht 65.0 in | Wt 132.7 lb

## 2024-09-25 DIAGNOSIS — K219 Gastro-esophageal reflux disease without esophagitis: Secondary | ICD-10-CM | POA: Diagnosis not present

## 2024-09-25 DIAGNOSIS — J3089 Other allergic rhinitis: Secondary | ICD-10-CM | POA: Diagnosis not present

## 2024-09-25 DIAGNOSIS — J302 Other seasonal allergic rhinitis: Secondary | ICD-10-CM | POA: Diagnosis not present

## 2024-09-25 DIAGNOSIS — Z889 Allergy status to unspecified drugs, medicaments and biological substances status: Secondary | ICD-10-CM | POA: Diagnosis not present

## 2024-09-25 DIAGNOSIS — H1010 Acute atopic conjunctivitis, unspecified eye: Secondary | ICD-10-CM

## 2024-09-25 DIAGNOSIS — R21 Rash and other nonspecific skin eruption: Secondary | ICD-10-CM | POA: Diagnosis not present

## 2024-09-25 DIAGNOSIS — J328 Other chronic sinusitis: Secondary | ICD-10-CM

## 2024-09-25 DIAGNOSIS — J4520 Mild intermittent asthma, uncomplicated: Secondary | ICD-10-CM

## 2024-09-25 MED ORDER — LEVOCETIRIZINE DIHYDROCHLORIDE 5 MG PO TABS: 5.0000 mg | ORAL_TABLET | Freq: Every evening | ORAL | 3 refills | Status: AC

## 2024-09-25 MED ORDER — ALBUTEROL SULFATE HFA 108 (90 BASE) MCG/ACT IN AERS: 2.0000 | INHALATION_SPRAY | RESPIRATORY_TRACT | 1 refills | Status: AC | PRN

## 2024-09-25 MED ORDER — MONTELUKAST SODIUM 10 MG PO TABS: 10.0000 mg | ORAL_TABLET | Freq: Every day | ORAL | 3 refills | Status: AC

## 2024-09-25 NOTE — Patient Instructions (Addendum)
 Environmental allergies 2021 skin testing positive to grass, weed, ragweed, trees, cat, mold, dog, dust mites. Borderline to cockroach.  Continue nvironmental control measures. Take Xyzal  and Allegra daily. Continue Singulair  (montelukast ) 10mg  daily at night. Use olopatadine  eye drops 0.2% once a day as needed for itchy/watery eyes. Nasal saline spray (i.e., Simply Saline) or nasal saline lavage (i.e., NeilMed) is recommended as needed and prior to medicated nasal sprays. Follow up with ENT as scheduled. Get labs to see if allergies has changed. We may want to discuss sublingual tablets depending on lab results.   Breathing/coughing Breathing test normal today.  Continue Singulair  (montelukast ) 10mg  daily at night. May use albuterol  rescue inhaler 2 puffs every 4 to 6 hours as needed for shortness of breath, chest tightness, coughing, and wheezing.  Monitor frequency of use - if you need to use it more than twice per week on a consistent basis let us  know.   Rash Keep track of rashes and take pictures. Write down what you had done/eaten during flares.  See below for proper skin care. Use fragrance free and dye free products. No dryer sheets or fabric softener.   Get bloodwork We are ordering labs, so please allow 1-2 weeks for the results to come back. With the newly implemented Cures Act, the labs might be visible to you at the same time that they become visible to me. However, I will not address the results until all of the results are back, so please be patient.  In the meantime, continue recommendations in your patient instructions, including avoidance measures (if applicable), until you hear from me.  Infections Keep track of infections/antibiotics.  Aspirin Continue to avoid.   Penicillin allergy Consider penicillin skin testing/drug challenge in the future. More than 90% of patients outgrow their penicillin allergy.   Follow up in 3 months or sooner if needed.  Skin  care recommendations  Bath time: Always use lukewarm water. AVOID very hot or cold water. Keep bathing time to 5-10 minutes. Do NOT use bubble bath. Use a mild soap and use just enough to wash the dirty areas. Do NOT scrub skin vigorously.  After bathing, pat dry your skin with a towel. Do NOT rub or scrub the skin.  Moisturizers and prescriptions:  ALWAYS apply moisturizers immediately after bathing (within 3 minutes). This helps to lock-in moisture. Use the moisturizer several times a day over the whole body. Good summer moisturizers include: Aveeno, CeraVe, Cetaphil. Good winter moisturizers include: Aquaphor, Vaseline, Cerave, Cetaphil, Eucerin, Vanicream. When using moisturizers along with medications, the moisturizer should be applied about one hour after applying the medication to prevent diluting effect of the medication or moisturize around where you applied the medications. When not using medications, the moisturizer can be continued twice daily as maintenance.  Laundry and clothing: Avoid laundry products with added color or perfumes. Use unscented hypo-allergenic laundry products such as Tide free, Cheer free & gentle, and All free and clear.  If the skin still seems dry or sensitive, you can try double-rinsing the clothes. Avoid tight or scratchy clothing such as wool. Do not use fabric softeners or dyer sheets.

## 2024-09-27 ENCOUNTER — Encounter: Payer: Self-pay | Admitting: Allergy

## 2024-09-28 LAB — CHRONIC URTICARIA (CU) EVAL
ALT: 17 IU/L (ref 0–32)
Basophils Absolute: 0 x10E3/uL (ref 0.0–0.2)
Basos: 0 %
CRP: 1 mg/L (ref 0–10)
EOS (ABSOLUTE): 0.1 x10E3/uL (ref 0.0–0.4)
Eos: 2 %
Hematocrit: 42.6 % (ref 34.0–46.6)
Hemoglobin: 14.6 g/dL (ref 11.1–15.9)
Immature Grans (Abs): 0 x10E3/uL (ref 0.0–0.1)
Immature Granulocytes: 0 %
Lymphocytes Absolute: 1.3 x10E3/uL (ref 0.7–3.1)
Lymphs: 26 %
MCH: 32.9 pg (ref 26.6–33.0)
MCHC: 34.3 g/dL (ref 31.5–35.7)
MCV: 96 fL (ref 79–97)
Monocytes Absolute: 0.3 x10E3/uL (ref 0.1–0.9)
Monocytes: 6 %
Neutrophils Absolute: 3.4 x10E3/uL (ref 1.4–7.0)
Neutrophils: 66 %
Platelets: 199 x10E3/uL (ref 150–450)
Pooled Donor- BAT CU: 4.71 % (ref 0.00–10.60)
RBC: 4.44 x10E6/uL (ref 3.77–5.28)
RDW: 11.6 % — ABNORMAL LOW (ref 11.7–15.4)
Sed Rate: 3 mm/h (ref 0–32)
Thyroperoxidase Ab SerPl-aCnc: 16 [IU]/mL (ref 0–34)
WBC: 5.1 x10E3/uL (ref 3.4–10.8)

## 2024-09-28 LAB — COMPREHENSIVE METABOLIC PANEL WITH GFR
AST: 22 IU/L (ref 0–40)
Albumin: 4.6 g/dL (ref 3.9–4.9)
Alkaline Phosphatase: 52 IU/L (ref 41–116)
BUN/Creatinine Ratio: 8 — ABNORMAL LOW (ref 9–23)
BUN: 6 mg/dL (ref 6–20)
Bilirubin Total: 0.8 mg/dL (ref 0.0–1.2)
CO2: 22 mmol/L (ref 20–29)
Calcium: 9.8 mg/dL (ref 8.7–10.2)
Chloride: 102 mmol/L (ref 96–106)
Creatinine, Ser: 0.77 mg/dL (ref 0.57–1.00)
Globulin, Total: 2.7 g/dL (ref 1.5–4.5)
Glucose: 74 mg/dL (ref 70–99)
Potassium: 4 mmol/L (ref 3.5–5.2)
Sodium: 140 mmol/L (ref 134–144)
Total Protein: 7.3 g/dL (ref 6.0–8.5)
eGFR: 104 mL/min/1.73 (ref >59)

## 2024-09-28 LAB — ALLERGENS W/TOTAL IGE AREA 2
Alternaria Alternata IgE: <0.1 kU/L
Aspergillus Fumigatus IgE: <0.1 kU/L
Bermuda Grass IgE: 0.14 kU/L — AB
Cat Dander IgE: 0.13 kU/L — AB
Cedar, Mountain IgE: <0.1 kU/L
Cladosporium Herbarum IgE: <0.1 kU/L
Cockroach, German IgE: <0.1 kU/L
Common Silver Birch IgE: 0.25 kU/L — AB
Cottonwood IgE: 0.18 kU/L — AB
D Farinae IgE: <0.1 kU/L
D Pteronyssinus IgE: <0.1 kU/L
Dog Dander IgE: 0.6 kU/L — AB
Elm, American IgE: <0.1 kU/L
Johnson Grass IgE: 0.68 kU/L — AB
Maple/Box Elder IgE: <0.1 kU/L
Mouse Urine IgE: <0.1 kU/L
Oak, White IgE: 0.33 kU/L — AB
Pecan, Hickory IgE: 0.29 kU/L — AB
Penicillium Chrysogen IgE: <0.1 kU/L
Pigweed, Rough IgE: <0.1 kU/L
Ragweed, Short IgE: <0.1 kU/L
Sheep Sorrel IgE Qn: <0.1 kU/L
Timothy Grass IgE: 4.05 kU/L — AB
White Mulberry IgE: <0.1 kU/L

## 2024-09-28 LAB — C3 AND C4
Complement C3, Serum: 115 mg/dL (ref 82–167)
Complement C4, Serum: 17 mg/dL (ref 12–38)

## 2024-09-28 LAB — ANTINUCLEAR ANTIBODIES, IFA: ANA Titer 1: NEGATIVE

## 2024-09-28 LAB — ALPHA-GAL PANEL
Allergen Lamb IgE: <0.1 kU/L
Beef IgE: <0.1 kU/L
IgE (Immunoglobulin E), Serum: 79 [IU]/mL (ref 6–495)
O215-IgE Alpha-Gal: <0.1 kU/L
Pork IgE: <0.1 kU/L

## 2024-09-28 LAB — THYROID CASCADE PROFILE: TSH: 2.37 u[IU]/mL (ref 0.450–4.500)

## 2024-09-28 LAB — TRYPTASE: Tryptase: 3.4 ug/L (ref 2.2–13.2)

## 2024-09-29 ENCOUNTER — Ambulatory Visit: Payer: Self-pay | Admitting: Allergy

## 2024-10-25 ENCOUNTER — Ambulatory Visit: Admitting: Family Medicine

## 2024-10-25 ENCOUNTER — Encounter: Payer: Self-pay | Admitting: Family Medicine

## 2024-10-25 VITALS — BP 114/76 | HR 80 | Temp 98.4°F | Ht 65.0 in | Wt 134.4 lb

## 2024-10-25 DIAGNOSIS — I10 Essential (primary) hypertension: Secondary | ICD-10-CM

## 2024-10-25 DIAGNOSIS — K580 Irritable bowel syndrome with diarrhea: Secondary | ICD-10-CM | POA: Diagnosis not present

## 2024-10-25 DIAGNOSIS — E559 Vitamin D deficiency, unspecified: Secondary | ICD-10-CM | POA: Diagnosis not present

## 2024-10-25 DIAGNOSIS — Z0000 Encounter for general adult medical examination without abnormal findings: Secondary | ICD-10-CM | POA: Diagnosis not present

## 2024-10-25 DIAGNOSIS — Z23 Encounter for immunization: Secondary | ICD-10-CM

## 2024-10-25 DIAGNOSIS — E538 Deficiency of other specified B group vitamins: Secondary | ICD-10-CM

## 2024-10-25 MED ORDER — DIPHENOXYLATE-ATROPINE 2.5-0.025 MG PO TABS: ORAL_TABLET | ORAL | 1 refills | Status: AC

## 2024-10-25 MED ORDER — AMLODIPINE BESYLATE 2.5 MG PO TABS: 2.5000 mg | ORAL_TABLET | Freq: Every day | ORAL | 2 refills | Status: AC

## 2024-10-25 MED ORDER — DICYCLOMINE HCL 10 MG PO CAPS: 10.0000 mg | ORAL_CAPSULE | Freq: Four times a day (QID) | ORAL | 11 refills | Status: AC | PRN

## 2024-10-25 NOTE — Patient Instructions (Addendum)
 Return in about 25 weeks (around 04/18/2025) for Routine chronic condition follow-up.        Great to see you today.  I have refilled the medication(s) we provide.   If labs were collected or images ordered, we will inform you of  results once we have received them and reviewed. We will contact you either by echart message, or telephone call.  Please give ample time to the testing facility, and our office to run,  receive and review results. Please do not call inquiring of results, even if you can see them in your chart. We will contact you as soon as we are able. If it has been over 1 week since the test was completed, and you have not yet heard from us , then please call us .    - echart message- for normal results that have been seen by the patient already.   - telephone call: abnormal results or if patient has not viewed results in their echart.  If a referral to a specialist was entered for you, please call us  in 2 weeks if you have not heard from the specialist office to schedule.

## 2024-10-25 NOTE — Progress Notes (Signed)
 Suzanne Jarvis , [birth year], 33 y.o., female MRN: [id number] Patient Care Team    Relationship Specialty Notifications Start End  Catherine Charlies LABOR, DO PCP - General Family Medicine  09/04/15   Arloa Nathanel HERO, NP (Inactive) Nurse Practitioner Obstetrics and Gynecology  01/29/20   Joya Stabs, DPM Consulting Physician Podiatry  01/03/22     Chief Complaint  Patient presents with   Annual Exam    chronic condition management.  Pt is fasting.      Subjective:Suzanne Jarvis is a 33 y.o.  pt present for an CPE and chronic condition management  Medication reconciliation completed today. Past medical history updated with any changes.  Health maintenance:  Colon cancer screen: routine screen at 45 (had a colonoscopy for other reasons 01/2019) Mammogram: routine screen 40 Cervical cancer screening: last pap: 12/2021. [facility], [facility], [facility]-5-year Immunizations: tdap utd 2021, Influenza-declined(encouraged yearly).  Prevnar-declined, hpv -declined Infectious disease screening: HIV completed w/ gyn. Hep c completed DEXA: routine screen Patient has a Dental home. Hospitalizations/ED visits: Reviewed  Hypertension/Raynaud's syndrome: Pt reports compliance with amlodipine  2.5 mg daily. Patient denies chest pain, shortness of breath, dizziness or lower extremity edema.  . Rf: lupus  Irritable bowel syndrome with diarrhea Patient reports her irritable bowel syndrome is well-controlled on Lomotil  and Bentyl  as needed.  She only takes this medication when she is going to be away from home and consuming meals.   She rarely requires lomotil  use  Review of Systems  Constitutional: Negative.   HENT: Negative.    Eyes: Negative.   Respiratory: Negative.    Cardiovascular: Negative.   Gastrointestinal: Negative.   Genitourinary: Negative.   Musculoskeletal: Negative.   Skin: Negative.   Neurological: Negative.   Endo/Heme/Allergies: Negative.   Psychiatric/Behavioral: Negative.     All other systems reviewed and are negative.        10/25/2024    9:09 AM 04/10/2024    8:55 AM 05/17/2023    2:13 PM 02/22/2023    8:33 AM 10/19/2022    9:34 AM  Depression screen PHQ 2/9  Decreased Interest 0 0 0 0 0  Down, Depressed, Hopeless 0 0 0 0 0  PHQ - 2 Score 0 0 0 0 0  Altered sleeping 1 1 2 1    Tired, decreased energy 1 2 2 2    Change in appetite 0 0 0 0   Feeling bad or failure about yourself  0 0 0 0   Trouble concentrating 1 1 1 1    Moving slowly or fidgety/restless 0 0 0 0   Suicidal thoughts 0 0 0 0   PHQ-9 Score 3 4  5  4     Difficult doing work/chores Not difficult at all Not difficult at all Not difficult at all Not difficult at all      Data saved with a previous flowsheet row definition      02/10/2022    9:58 AM 02/22/2023    8:32 AM 05/17/2023    2:13 PM 04/10/2024    8:55 AM 10/25/2024    9:08 AM  Fall Risk  Falls in the past year?  0 0 0 0  Was there an injury with Fall?  0   0  0  Fall Risk Category Calculator  0  0 0  (RETIRED) Patient Fall Risk Level Low fall risk       Patient at Risk for Falls Due to  No Fall Risks No Fall Risks No Fall  Risks No Fall Risks  Fall risk Follow up  Falls evaluation completed Falls evaluation completed Follow up appointment Falls evaluation completed     Data saved with a previous flowsheet row definition    Allergies  Allergen Reactions   Aspirin Anaphylaxis   Adrenal Cortex Extract Hives   Amoxicillin Hives   Hyoscyamine  Other (See Comments)   Mixed Grasses    Novocain [Procaine] Swelling   Other Hives    grass, weed, ragweed, trees, cat, mold, dog, dust mites. Borderline to cockroach.   Paxil  [Paroxetine  Hcl]     Dizziness    Penicillins     REACTION: hives   Social History   Social History Narrative   Right Handed    Lives in a two story condo on the second floor    Goes to the gyn 3 times a week.      Has a Sherilyn Haws that she called her son    Past Medical History:  Diagnosis Date    ADD (attention deficit disorder) 07/08/2020   Angio-edema    Aphthous ulcer of mouth 09/29/2021   ASCUS with positive high risk HPV cervical 03/26/2019   01/2019; negative colposcopy 03/2019; plan repeat pap and HPV test 01/2020   Asthma    Bilateral hand numbness 04/14/2021   Chronic headaches    Concussion with no loss of consciousness 07/08/2020   May 2021   Eczema    Gastroesophageal reflux disease 03/22/2023   History of brain concussion 07/08/2020   May 2021     IBS (irritable bowel syndrome)    Metatarsalgia of right foot 07/07/2021   Penicillin allergy 04/28/2020   Seasonal allergies 01/14/2021   Seasonal and perennial allergic rhinoconjunctivitis 07/28/2020   Tonsillitis    Urticaria    Vitamin B 12 deficiency 12/10/2014   Past Surgical History:  Procedure Laterality Date   WISDOM TOOTH EXTRACTION     Family History  Problem Relation Age of Onset   Allergies Mother    Asthma Mother    Hypertension Mother    Allergic rhinitis Mother    Urticaria Mother    Healthy Father    Allergic rhinitis Sister    Eczema Sister    Anemia Sister    Hashimoto's thyroiditis Brother    ADD / ADHD Brother    Arthritis Maternal Grandmother    Irritable bowel syndrome Maternal Grandmother    Breast cancer Maternal Grandmother    Lupus Cousin    Hashimoto's thyroiditis Cousin    Lung cancer Other        paternal great grandfather   Lung cancer Other        maternal great grandfather   Pancreatic cancer Other        great aunt   Colon cancer Neg Hx    Allergies as of 10/25/2024       Reactions   Aspirin Anaphylaxis   Adrenal Cortex Extract Hives   Amoxicillin Hives   Hyoscyamine  Other (See Comments)   Mixed Grasses    Novocain [procaine] Swelling   Other Hives   grass, weed, ragweed, trees, cat, mold, dog, dust mites. Borderline to cockroach.   Paxil  [paroxetine  Hcl]    Dizziness   Penicillins    REACTION: hives        Medication List        Accurate as of  October 25, 2024  9:36 AM. If you have any questions, ask your nurse or doctor.  STOP taking these medications    Magnesium 100 MG Tabs Stopped by: Charlies Bellini, DO   ondansetron  4 MG tablet Commonly known as: Zofran  Stopped by: Charlies Bellini, DO       TAKE these medications    albuterol  108 (90 Base) MCG/ACT inhaler Commonly known as: Ventolin  HFA Inhale 2 puffs into the lungs every 4 (four) hours as needed for wheezing or shortness of breath (coughing fits).   amLODipine  2.5 MG tablet Commonly known as: NORVASC  Take 1 tablet (2.5 mg total) by mouth daily.   B-12 1000 MCG Tabs Take 2 tablets by mouth daily.   colchicine 0.6 MG tablet Take 0.6 mg by mouth as needed.   dicyclomine  10 MG capsule Commonly known as: BENTYL  Take 1 capsule (10 mg total) by mouth 4 (four) times daily as needed for spasms.   diphenoxylate -atropine  2.5-0.025 MG tablet Commonly known as: LOMOTIL  TAKE 1 TABLET BY MOUTH FOUR TIMES DAILY AS NEEDED FOR DIARRHEA OR LOOSE STOOLS   FORTIFY PROBIOTIC WOMENS PO Take by mouth.   Hailey 24 Fe 1-20 MG-MCG(24) tablet Generic drug: Norethindrone Acetate-Ethinyl Estrad-FE Take 1 tablet by mouth daily.   levocetirizine 5 MG tablet Commonly known as: XYZAL  Take 1 tablet (5 mg total) by mouth every evening.   montelukast  10 MG tablet Commonly known as: SINGULAIR  Take 1 tablet (10 mg total) by mouth at bedtime.   multivitamin with minerals tablet Take 1 tablet by mouth daily.   Olopatadine  HCl 0.2 % Soln Apply 1 drop to eye daily as needed (itchy/watery eyes).   Vitamin D  (Cholecalciferol) 25 MCG (1000 UT) Caps Take by mouth.        All past medical history, surgical history, allergies, family history, immunizations andmedications were updated in the EMR today and reviewed under the history and medication portions of their EMR.     ROS: Negative, with the exception of above mentioned in HPI   Objective:  BP 114/76   Pulse 80    Temp 98.4 F (36.9 C)   Ht 5' 5 (1.651 m)   Wt 134 lb 6.4 oz (61 kg)   LMP 10/25/2024 (Exact Date)   SpO2 98%   BMI 22.37 kg/m  Body mass index is 22.37 kg/m. Patient's last menstrual period was 10/25/2024 (exact date).  Physical Exam Vitals and nursing note reviewed.  Constitutional:      General: She is not in acute distress.    Appearance: Normal appearance. She is not ill-appearing or toxic-appearing.  HENT:     Head: Normocephalic and atraumatic.     Right Ear: Ear canal and external ear normal. A middle ear effusion is present. There is no impacted cerumen. Tympanic membrane is not erythematous, retracted or bulging.     Left Ear: Ear canal and external ear normal. A middle ear effusion is present. There is no impacted cerumen. Tympanic membrane is not erythematous, retracted or bulging.     Ears:     Comments: B/l small ear effusion present.     Nose: No congestion or rhinorrhea.     Mouth/Throat:     Mouth: Mucous membranes are moist.     Pharynx: Oropharynx is clear. No oropharyngeal exudate or posterior oropharyngeal erythema.  Eyes:     General: No scleral icterus.       Right eye: No discharge.        Left eye: No discharge.     Extraocular Movements: Extraocular movements intact.     Conjunctiva/sclera: Conjunctivae normal.  Pupils: Pupils are equal, round, and reactive to light.  Cardiovascular:     Rate and Rhythm: Normal rate and regular rhythm.     Pulses: Normal pulses.     Heart sounds: Normal heart sounds. No murmur heard.    No friction rub. No gallop.  Pulmonary:     Effort: Pulmonary effort is normal. No respiratory distress.     Breath sounds: Normal breath sounds. No stridor. No wheezing, rhonchi or rales.  Chest:     Chest wall: No tenderness.  Abdominal:     General: Abdomen is flat. Bowel sounds are normal. There is no distension.     Palpations: Abdomen is soft. There is no mass.     Tenderness: There is no abdominal tenderness. There is  no right CVA tenderness, left CVA tenderness, guarding or rebound.     Hernia: No hernia is present.  Musculoskeletal:        General: No swelling, tenderness or deformity. Normal range of motion.     Cervical back: Normal range of motion and neck supple. No rigidity or tenderness.     Right lower leg: No edema.     Left lower leg: No edema.  Lymphadenopathy:     Cervical: No cervical adenopathy.  Skin:    General: Skin is warm and dry.     Coloration: Skin is not jaundiced or pale.     Findings: No bruising, erythema, lesion or rash.  Neurological:     General: No focal deficit present.     Mental Status: She is alert and oriented to person, place, and time. Mental status is at baseline.     Cranial Nerves: No cranial nerve deficit.     Sensory: No sensory deficit.     Motor: No weakness.     Coordination: Coordination normal.     Gait: Gait normal.     Deep Tendon Reflexes: Reflexes normal.  Psychiatric:        Mood and Affect: Mood normal.        Behavior: Behavior normal.        Thought Content: Thought content normal.        Judgment: Judgment normal.    No results found. No results found. No results found for this or any previous visit (from the past 24 hours).  Assessment/Plan: MERL GUARDINO is a 33 y.o. female present for OV for CPE and chronic Conditions/illness Management Irritable bowel syndrome with diarrhea Stable Continue Lomotil  as needed Continue Bentyl  as needed  Benign hypertension/raynauds Stable Continue amlodipine  2.5 mg every day.   Influenza vaccine needed Declined Need for vaccination for pneumococcus Declined Need for HPV vaccine Declined  Vitamin D  deficiency Continue supplementation B12 deficiency Continue supplementation  Routine general medical examination at a health care facility (Primary) Patient was encouraged to exercise greater than 150 minutes a week. Patient was encouraged to choose a diet filled with fresh fruits and  vegetables, and lean meats. AVS provided to patient today for education/recommendation on gender specific health and safety maintenance. Colon cancer screen: routine screen at 45 (had a colonoscopy for other reasons 01/2019) Mammogram: routine screen 40 Cervical cancer screening: last pap: 12/2021. [facility], [facility], [facility] Immunizations: tdap utd 2021, Influenza-declined(encouraged yearly).  Prevnar-declined, hpv -declined Infectious disease screening: HIV completed w/ gyn. Hep c completed DEXA: routine screen   Reviewed expectations re: course of current medical issues. Discussed self-management of symptoms. Outlined signs and symptoms indicating need for more acute intervention. Patient verbalized understanding and  all questions were answered. Patient received an After-Visit Summary.   No orders of the defined types were placed in this encounter.   Meds ordered this encounter  Medications   amLODipine  (NORVASC ) 2.5 MG tablet    Sig: Take 1 tablet (2.5 mg total) by mouth daily.    Dispense:  90 tablet    Refill:  2   dicyclomine  (BENTYL ) 10 MG capsule    Sig: Take 1 capsule (10 mg total) by mouth 4 (four) times daily as needed for spasms.    Dispense:  120 capsule    Refill:  11    Prn med- please only fill if pt request   diphenoxylate -atropine  (LOMOTIL ) 2.5-0.025 MG tablet    Sig: TAKE 1 TABLET BY MOUTH FOUR TIMES DAILY AS NEEDED FOR DIARRHEA OR LOOSE STOOLS    Dispense:  180 tablet    Refill:  1    Referral Orders  No referral(s) requested today    Note is dictated utilizing voice recognition software. Although note has been proof read prior to signing, occasional typographical errors still can be missed. If any questions arise, please do not hesitate to call for verification.   electronically signed by:  Charlies Bellini, DO  [facility] - [place]

## 2024-11-20 ENCOUNTER — Encounter (INDEPENDENT_AMBULATORY_CARE_PROVIDER_SITE_OTHER): Payer: Self-pay | Admitting: Otolaryngology

## 2024-11-20 ENCOUNTER — Ambulatory Visit (INDEPENDENT_AMBULATORY_CARE_PROVIDER_SITE_OTHER): Admitting: Otolaryngology

## 2024-11-20 VITALS — BP 124/82 | HR 86 | Ht 65.0 in | Wt 134.0 lb

## 2024-11-20 DIAGNOSIS — J302 Other seasonal allergic rhinitis: Secondary | ICD-10-CM | POA: Diagnosis not present

## 2024-11-20 DIAGNOSIS — J342 Deviated nasal septum: Secondary | ICD-10-CM | POA: Diagnosis not present

## 2024-11-20 DIAGNOSIS — J0181 Other acute recurrent sinusitis: Secondary | ICD-10-CM | POA: Diagnosis not present

## 2024-11-20 DIAGNOSIS — J343 Hypertrophy of nasal turbinates: Secondary | ICD-10-CM

## 2024-11-20 DIAGNOSIS — J3489 Other specified disorders of nose and nasal sinuses: Secondary | ICD-10-CM | POA: Diagnosis not present

## 2024-11-20 DIAGNOSIS — R0982 Postnasal drip: Secondary | ICD-10-CM

## 2024-11-20 DIAGNOSIS — J3089 Other allergic rhinitis: Secondary | ICD-10-CM | POA: Diagnosis not present

## 2024-11-20 DIAGNOSIS — H1010 Acute atopic conjunctivitis, unspecified eye: Secondary | ICD-10-CM | POA: Diagnosis not present

## 2024-11-20 DIAGNOSIS — R0981 Nasal congestion: Secondary | ICD-10-CM

## 2024-11-20 NOTE — Progress Notes (Signed)
 Dear Dr. Catherine, Here is my assessment for our mutual patient, Suzanne Jarvis. Thank you for allowing me the opportunity to care for your patient. Please do not hesitate to contact me should you have any other questions. Sincerely, Dr. Eldora Blanch  Otolaryngology Clinic Note  HISTORY: Suzanne Jarvis is a 34 y.o. female kindly referred by Dr. Catherine for evaluation of recurrent sinusitis  Initial visit (07/2024): She reports that she has a history of allergic rhinitis with multiple positives on prior allergy testing. Symptoms include significant mucoid rhinorrhea and more bothersome PND than anterior rhinorrhea. Some congestion as well; Bilateral symptoms. She denies any discoloration, but reports that it is thick and feels like it gets stuck. Associated symptoms include bilateral ear fullness and pressure. She does not report significant sinonasal pain/pressure (occasional but not super often). She did have multiply recurrent sinus infections starting early 2024 and multiple years before that -- she did get antibiotics for it (every 3 months) but lately has just been toughing it out. No abx over past few months. Symptoms are year round and when she does get sinus infections (discolored drainage and pressure), abx do help but not with the drainage. Occassional congestion.   Does have typical AR symptoms - sneezing, itching, worse rhinorrhea - more so seasonal, spring and fall typically  She has seen allergy, and tried AIT, rinses, allegra, xyzal , singulair . She has tried budesonide  rinses and felt sick afterwards so stopped it --- used it for about a week or so.   Allergy testing has been done. No previous sinonasal surgery. No diplopia  She is currently using PO anthistamine, neti pot (multiple times per day) --------------------------------------------------------- 11/20/2024 Did not take abx, sprays in the rinse caused epistaxis. Had recent further allergy workup. Returns for follow up.  Continues to have rhinorrhea, b/l facial pressure and pressure. No abx.   GLP-1: no AP/AC: no  Tobacco: former, quit  PMHx: Unspecified connective tissue disease, AR, Mild reactive airway disease, Raynaud's  RADIOGRAPHIC EVALUATION AND INDEPENDENT REVIEW OF OTHER RECORDS: Dr. Luke (03/27/2024): noted reactive airway disease, frequent URI; persistent mucus production A/P, fluid in ear; on allegra, xyzal , singulair ; no nasal sprays because of epistaxis; using neti pot; prior AIT; experiencing antibiotics once or twice since last visit, saw ENT prior; Dx: Mild Reactive airway disease, recurrent sinus infections; Rx: PO antihistamine, singulair , aline spray, budesonide  rinses, ref to ENT  IgE/RAST 09/25/2024: + Grass, dog, borderline trees; can try sublingual for grass; Eos 100  Allergy testing 2021: + grass, weed, ragweed, trees, cat, mold, dog, dust bites. Labs CBC and CMP 10/25/2023: BUN/Cr 9/0.81; WBC 6.5, Eos prior ~200 MRI Brain 05/09/2020 independently interpreted with respect to sinuses: right septal deviation, bilateral max MPT (silent sinus?) with bilateral ethmoid and frontal MPT as well  Past Medical History:  Diagnosis Date   ADD (attention deficit disorder) 07/08/2020   Angio-edema    Aphthous ulcer of mouth 09/29/2021   ASCUS with positive high risk HPV cervical 03/26/2019   01/2019; negative colposcopy 03/2019; plan repeat pap and HPV test 01/2020   Asthma    Bilateral hand numbness 04/14/2021   Chronic headaches    Concussion with no loss of consciousness 07/08/2020   May 2021   Eczema    Gastroesophageal reflux disease 03/22/2023   History of brain concussion 07/08/2020   May 2021     IBS (irritable bowel syndrome)    Metatarsalgia of right foot 07/07/2021   Penicillin allergy 04/28/2020   Seasonal allergies 01/14/2021   Seasonal  and perennial allergic rhinoconjunctivitis 07/28/2020   Tonsillitis    Urticaria    Vitamin B 12 deficiency 12/10/2014   Past Surgical  History:  Procedure Laterality Date   WISDOM TOOTH EXTRACTION     Family History  Problem Relation Age of Onset   Allergies Mother    Asthma Mother    Hypertension Mother    Allergic rhinitis Mother    Urticaria Mother    Healthy Father    Allergic rhinitis Sister    Eczema Sister    Anemia Sister    Hashimoto's thyroiditis Brother    ADD / ADHD Brother    Arthritis Maternal Grandmother    Irritable bowel syndrome Maternal Grandmother    Breast cancer Maternal Grandmother    Lupus Cousin    Hashimoto's thyroiditis Cousin    Lung cancer Other        paternal great grandfather   Lung cancer Other        maternal great grandfather   Pancreatic cancer Other        great aunt   Colon cancer Neg Hx    Social History   Tobacco Use   Smoking status: Former    Current packs/day: 0.00    Average packs/day: 0.3 packs/day for 6.0 years (1.5 ttl pk-yrs)    Types: Cigarettes    Start date: 2012    Quit date: 2018    Years since quitting: 8.0   Smokeless tobacco: Never  Substance Use Topics   Alcohol use: Yes    Comment: Rarely   Allergies  Allergen Reactions   Aspirin Anaphylaxis   Adrenal Cortex Extract Hives   Amoxicillin Hives   Hyoscyamine  Other (See Comments)   Mixed Grasses    Novocain [Procaine] Swelling   Other Hives    grass, weed, ragweed, trees, cat, mold, dog, dust mites. Borderline to cockroach.   Paxil  [Paroxetine  Hcl]     Dizziness    Penicillins     REACTION: hives   Current Outpatient Medications  Medication Sig Dispense Refill   albuterol  (VENTOLIN  HFA) 108 (90 Base) MCG/ACT inhaler Inhale 2 puffs into the lungs every 4 (four) hours as needed for wheezing or shortness of breath (coughing fits). 18 g 1   amLODipine  (NORVASC ) 2.5 MG tablet Take 1 tablet (2.5 mg total) by mouth daily. 90 tablet 2   colchicine 0.6 MG tablet Take 0.6 mg by mouth as needed.     Cyanocobalamin (B-12) 1000 MCG TABS Take 2 tablets by mouth daily.     dicyclomine  (BENTYL )  10 MG capsule Take 1 capsule (10 mg total) by mouth 4 (four) times daily as needed for spasms. 120 capsule 11   diphenoxylate -atropine  (LOMOTIL ) 2.5-0.025 MG tablet TAKE 1 TABLET BY MOUTH FOUR TIMES DAILY AS NEEDED FOR DIARRHEA OR LOOSE STOOLS 180 tablet 1   HAILEY 24 FE 1-20 MG-MCG(24) tablet Take 1 tablet by mouth daily.     levocetirizine (XYZAL ) 5 MG tablet Take 1 tablet (5 mg total) by mouth every evening. 90 tablet 3   montelukast  (SINGULAIR ) 10 MG tablet Take 1 tablet (10 mg total) by mouth at bedtime. 90 tablet 3   Multiple Vitamins-Minerals (MULTIVITAMIN WITH MINERALS) tablet Take 1 tablet by mouth daily.     Olopatadine  HCl 0.2 % SOLN Apply 1 drop to eye daily as needed (itchy/watery eyes). 2.5 mL 5   Probiotic Product (FORTIFY PROBIOTIC WOMENS PO) Take by mouth.     Vitamin D , Cholecalciferol, 25 MCG (1000 UT) CAPS Take  by mouth.     No current facility-administered medications for this visit.   BP 124/82 (BP Location: Left Arm, Patient Position: Sitting, Cuff Size: Normal)   Pulse 86   Ht 5' 5 (1.651 m)   Wt 134 lb (60.8 kg)   LMP 10/25/2024 (Exact Date)   SpO2 99%   BMI 22.30 kg/m   PHYSICAL EXAM:  BP 124/82 (BP Location: Left Arm, Patient Position: Sitting, Cuff Size: Normal)   Pulse 86   Ht 5' 5 (1.651 m)   Wt 134 lb (60.8 kg)   LMP 10/25/2024 (Exact Date)   SpO2 99%   BMI 22.30 kg/m    Salient findings:  CN II-XII intact Bilateral EAC clear and TM intact with well pneumatized middle ear spaces Nose: Anterior rhinoscopy reveals septum deviation right, bilateral inferior turbinate hypertrophy.  Nasal endoscopy was indicated to better evaluate the nose and paranasal sinuses, given the patient's history and exam findings, and is detailed below. No respiratory distress or stridor   PROCEDURE:  Prior to initiating any procedures, risks/benefits/alternatives were explained to the patient and verbal consent obtained. Diagnostic Nasal Endoscopy Pre-procedure  diagnosis: Concern for chronic sinusitis, post nasal drip, reassess Post-procedure diagnosis: same Indication: See pre-procedure diagnosis and physical exam above Complications: None apparent EBL: 0 mL Anesthesia: Lidocaine  4% and topical decongestant was topically sprayed in each nasal cavity  Description of Procedure:  Patient was identified. A rigid 30 degree endoscope was utilized to evaluate the sinonasal cavities, mucosa, sinus ostia and turbinates and septum.  Overall, signs of mucosal inflammation are mild but noted.  Also noted are concern for inverted/atelectatic uncincate b/l.  No mucopurulence, polyps, or masses noted.   Right Middle meatus: mild mucosal edema Right SE Recess: clear Left MM: mild mucosal edema Left SE Recess: clear Overall findings are stable  CPT CODE -- 31231 - Mod 25   ASSESSMENT:  34 y.o. with:  1. Other acute recurrent sinusitis   2. Nasal septal deviation   3. Hypertrophy of both inferior nasal turbinates   4. Nasal congestion   5. Nasal obstruction   6. Post-nasal drip   7. Seasonal and perennial allergic rhinoconjunctivitis    Multiple sinonasal exacerbations in setting of allergies; prior MRI concerning for sinusitis as well. She's tried multiple modalities of medical management but with persistent symptoms. Needing about 4 rounds of antibiotics per year  She could not tolerate sprays but technique was suboptimal so will trial those again (discussed correct technique).  Otherwise we again discussed that she would be a good candidate for FESS and septoplasty and turbinate reduction to improve her nasal breathing and reduce frequency/severity of infections. She does not wish for this currently and would like to hold off. We also discussed further testing with CT but given decision to not pursue FESS and prior MRI, do not think CT would add to her management currently  As such, would recommend continued daily rinses, mgmt per Allergy and retrial  flonase  and astelin . If she would like to pursue septo/turbs/FESS, she was invited to call back. Would need pre-op CT  See below regarding exact medications prescribed this encounter including dosages and route: No orders of the defined types were placed in this encounter.    Thank you for allowing me the opportunity to care for your patient. Please do not hesitate to contact me should you have any other questions.  Sincerely, Eldora Blanch, MD Otolaryngologist (ENT), [facility] Phone: [phone] Fax: [phone]  MDM:  I have  personally spent 31 minutes involved in face-to-face and non-face-to-face activities for this patient on the day of the visit.  Professional time spent excludes any procedures performed but includes the following activities, in addition to those noted in the documentation: preparing to see the patient (review of outside documentation and results), performing a medically appropriate examination, extensive counseling, documenting in the electronic health record   11/20/2024, 9:24 AM

## 2024-12-18 ENCOUNTER — Encounter: Payer: Self-pay | Admitting: Obstetrics and Gynecology

## 2024-12-18 ENCOUNTER — Ambulatory Visit: Payer: Self-pay | Admitting: Obstetrics and Gynecology

## 2024-12-18 ENCOUNTER — Other Ambulatory Visit (HOSPITAL_COMMUNITY): Admission: RE | Admit: 2024-12-18 | Discharge: 2024-12-18 | Disposition: A | Source: Ambulatory Visit

## 2024-12-18 VITALS — BP 125/80 | HR 76 | Ht 65.0 in | Wt 133.8 lb

## 2024-12-18 DIAGNOSIS — Z113 Encounter for screening for infections with a predominantly sexual mode of transmission: Secondary | ICD-10-CM

## 2024-12-18 DIAGNOSIS — Z3041 Encounter for surveillance of contraceptive pills: Secondary | ICD-10-CM

## 2024-12-18 DIAGNOSIS — N760 Acute vaginitis: Secondary | ICD-10-CM

## 2024-12-18 DIAGNOSIS — Z01419 Encounter for gynecological examination (general) (routine) without abnormal findings: Secondary | ICD-10-CM

## 2024-12-18 MED ORDER — HAILEY 24 FE 1-20 MG-MCG(24) PO TABS: 1.0000 | ORAL_TABLET | Freq: Every day | ORAL | 11 refills | Status: AC

## 2024-12-18 MED ORDER — NYSTATIN-TRIAMCINOLONE 100000-0.1 UNIT/GM-% EX OINT: 1.0000 | TOPICAL_OINTMENT | Freq: Two times a day (BID) | CUTANEOUS | 0 refills | Status: AC

## 2024-12-18 NOTE — Progress Notes (Signed)
 "   GYNECOLOGY ANNUAL PREVENTATIVE CARE ENCOUNTER NOTE  History:     Suzanne Jarvis is a 34 y.o. G0P0000 female here for a routine annual gynecologic exam.  Current complaints: none.   Denies abnormal vaginal bleeding, discharge, pelvic pain, problems with intercourse or other gynecologic concerns.    Gynecologic History Patient's last menstrual period was 11/22/2024 (approximate). Contraception: OCP (estrogen/progesterone)  Last Pap: 01/05/22. Result was normal with negative HPV.  Obstetric History OB History  Gravida Para Term Preterm AB Living  0 0 0 0 0 0  SAB IAB Ectopic Multiple Live Births  0 0 0 0 0    Past Medical History:  Diagnosis Date   ADD (attention deficit disorder) 07/08/2020   Angio-edema    Aphthous ulcer of mouth 09/29/2021   ASCUS with positive high risk HPV cervical 03/26/2019   01/2019; negative colposcopy 03/2019; plan repeat pap and HPV test 01/2020   Asthma    Bilateral hand numbness 04/14/2021   Chronic headaches    Concussion with no loss of consciousness 07/08/2020   May 2021   Eczema    Gastroesophageal reflux disease 03/22/2023   History of brain concussion 07/08/2020   May 2021     IBS (irritable bowel syndrome)    Metatarsalgia of right foot 07/07/2021   Penicillin allergy 04/28/2020   Seasonal allergies 01/14/2021   Seasonal and perennial allergic rhinoconjunctivitis 07/28/2020   Tonsillitis    Urticaria    Vitamin B 12 deficiency 12/10/2014    Past Surgical History:  Procedure Laterality Date   WISDOM TOOTH EXTRACTION      Medications Ordered Prior to Encounter[1]  Allergies[2]  Social History:  reports that she quit smoking about 8 years ago. Her smoking use included cigarettes. She started smoking about 14 years ago. She has a 1.5 pack-year smoking history. She has never used smokeless tobacco. She reports current alcohol use. She reports that she does not use drugs.  Family History  Problem Relation Age of Onset    Allergies Mother    Asthma Mother    Hypertension Mother    Allergic rhinitis Mother    Urticaria Mother    Healthy Father    Allergic rhinitis Sister    Eczema Sister    Anemia Sister    Hashimoto's thyroiditis Brother    ADD / ADHD Brother    Arthritis Maternal Grandmother    Irritable bowel syndrome Maternal Grandmother    Breast cancer Maternal Grandmother    Lupus Cousin    Hashimoto's thyroiditis Cousin    Lung cancer Other        paternal great grandfather   Lung cancer Other        maternal great grandfather   Pancreatic cancer Other        great aunt   Colon cancer Neg Hx     The following portions of the patient's history were reviewed and updated as appropriate: allergies, current medications, past family history, past medical history, past social history, past surgical history and problem list.  Review of Systems Pertinent items noted in HPI and remainder of comprehensive ROS otherwise negative.  Physical Exam:  BP 125/80   Pulse 76   Ht 5' 5 (1.651 m)   Wt 60.7 kg   LMP 11/22/2024 (Approximate)   BMI 22.27 kg/m  CONSTITUTIONAL: Well-developed, well-nourished female in no acute distress.  HENT:  Normocephalic, atraumatic, External right and left ear normal.  EYES: Conjunctivae and EOM are normal. Pupils are equal, round,  and reactive to light. No scleral icterus.  NECK: Normal range of motion, supple, no masses.  Normal thyroid .  SKIN: Skin is warm and dry. No rash noted. Not diaphoretic. No erythema. No pallor. MUSCULOSKELETAL: Normal range of motion. No tenderness.  No cyanosis, clubbing, or edema. NEUROLOGIC: Alert and oriented to person, place, and time. Normal reflexes, muscle tone coordination.  PSYCHIATRIC: Normal mood and affect. Normal behavior. Normal judgment and thought content. CARDIOVASCULAR: Normal heart rate noted, regular rhythm RESPIRATORY: Clear to auscultation bilaterally. Effort and breath sounds normal, no problems with respiration  noted. BREASTS: Symmetric in size. No masses, tenderness, skin changes, nipple drainage, or lymphadenopathy bilaterally. Performed in the presence of a chaperone. ABDOMEN: Soft, no distention noted.  No tenderness, rebound or guarding.    Assessment and Plan:    1. Routine screening for STI (sexually transmitted infection) (Primary) - Cervicovaginal ancillary only( [place])  2. Encounter for annual routine gynecological examination - Happy with current OCP and would like a refill.  - Pap in 2 years.   Will follow up results of pap smear and manage accordingly. Normal breast examination today, she was advised to perform periodic self breast examinations.  Routine preventative health maintenance measures emphasized. Please refer to After Visit Summary for other counseling recommendations.       Durel Conard DEL, Student-MidWife Faculty [employer] for [employer], [facility]    [1]  Current Outpatient Medications on File Prior to Visit  Medication Sig Dispense Refill   albuterol  (VENTOLIN  HFA) 108 (90 Base) MCG/ACT inhaler Inhale 2 puffs into the lungs every 4 (four) hours as needed for wheezing or shortness of breath (coughing fits). 18 g 1   amLODipine  (NORVASC ) 2.5 MG tablet Take 1 tablet (2.5 mg total) by mouth daily. 90 tablet 2   colchicine 0.6 MG tablet Take 0.6 mg by mouth as needed.     Cyanocobalamin (B-12) 1000 MCG TABS Take 2 tablets by mouth daily.     dicyclomine  (BENTYL ) 10 MG capsule Take 1 capsule (10 mg total) by mouth 4 (four) times daily as needed for spasms. 120 capsule 11   diphenoxylate -atropine  (LOMOTIL ) 2.5-0.025 MG tablet TAKE 1 TABLET BY MOUTH FOUR TIMES DAILY AS NEEDED FOR DIARRHEA OR LOOSE STOOLS 180 tablet 1   HAILEY  24 FE 1-20 MG-MCG(24) tablet Take 1 tablet by mouth daily.     levocetirizine (XYZAL ) 5 MG tablet Take 1 tablet (5 mg total) by mouth every evening. 90 tablet 3   montelukast  (SINGULAIR ) 10 MG tablet Take 1  tablet (10 mg total) by mouth at bedtime. 90 tablet 3   Multiple Vitamins-Minerals (MULTIVITAMIN WITH MINERALS) tablet Take 1 tablet by mouth daily.     nystatin  cream (MYCOSTATIN ) Apply 1 Application topically 2 (two) times daily.     Probiotic Product (FORTIFY PROBIOTIC WOMENS PO) Take by mouth.     Vitamin D , Cholecalciferol, 25 MCG (1000 UT) CAPS Take by mouth.     Olopatadine  HCl 0.2 % SOLN Apply 1 drop to eye daily as needed (itchy/watery eyes). (Patient not taking: Reported on 12/18/2024) 2.5 mL 5   No current facility-administered medications on file prior to visit.  [2]  Allergies Allergen Reactions   Aspirin Anaphylaxis   Adrenal Cortex Extract Hives   Amoxicillin Hives   Hyoscyamine  Other (See Comments)   Mixed Grasses    Novocain [Procaine] Swelling   Other Hives    grass, weed, ragweed, trees, cat, mold, dog, dust mites. Borderline to cockroach.  Paxil  [Paroxetine  Hcl]     Dizziness    Penicillins     REACTION: hives   "

## 2024-12-18 NOTE — Progress Notes (Signed)
 Annual; on pill, no interruption in RX   PAP not due until 2028  Birth control refill. Pt also needs refill of nystatin  cream (has photo on her phone).

## 2024-12-20 LAB — CERVICOVAGINAL ANCILLARY ONLY
Bacterial Vaginitis (gardnerella): POSITIVE — AB
Candida Glabrata: NEGATIVE
Candida Vaginitis: NEGATIVE
Chlamydia: NEGATIVE
Comment: NEGATIVE
Comment: NEGATIVE
Comment: NEGATIVE
Comment: NEGATIVE
Comment: NEGATIVE
Comment: NORMAL
Neisseria Gonorrhea: NEGATIVE
Trichomonas: NEGATIVE

## 2024-12-25 ENCOUNTER — Ambulatory Visit: Admitting: Allergy

## 2025-04-23 ENCOUNTER — Ambulatory Visit: Admitting: Family Medicine
